# Patient Record
Sex: Male | Born: 1960 | Race: White | Hispanic: No | Marital: Married | Smoking: Former smoker
Health system: Southern US, Community
[De-identification: ages and names within clinical notes are randomized; demographics above are authoritative.]

## PROBLEM LIST (undated history)

## (undated) DIAGNOSIS — I82409 Acute embolism and thrombosis of unspecified deep veins of unspecified lower extremity: Secondary | ICD-10-CM

## (undated) DIAGNOSIS — E785 Hyperlipidemia, unspecified: Secondary | ICD-10-CM

## (undated) DIAGNOSIS — I10 Essential (primary) hypertension: Secondary | ICD-10-CM

## (undated) DIAGNOSIS — R570 Cardiogenic shock: Secondary | ICD-10-CM

## (undated) DIAGNOSIS — I509 Heart failure, unspecified: Secondary | ICD-10-CM

## (undated) DIAGNOSIS — G4733 Obstructive sleep apnea (adult) (pediatric): Secondary | ICD-10-CM

## (undated) DIAGNOSIS — I2510 Atherosclerotic heart disease of native coronary artery without angina pectoris: Secondary | ICD-10-CM

## (undated) DIAGNOSIS — E119 Type 2 diabetes mellitus without complications: Secondary | ICD-10-CM

## (undated) DIAGNOSIS — K859 Acute pancreatitis without necrosis or infection, unspecified: Secondary | ICD-10-CM

## (undated) HISTORY — PX: CORONARY STENT INTERVENTION: CATH118234

## (undated) HISTORY — PX: KNEE SURGERY: SHX244

## (undated) HISTORY — PX: ABDOMINAL SURGERY: SHX537

---

## 2008-10-24 ENCOUNTER — Ambulatory Visit: Payer: Self-pay | Admitting: Cardiology

## 2008-11-02 ENCOUNTER — Ambulatory Visit: Payer: Self-pay | Admitting: Cardiology

## 2008-11-19 ENCOUNTER — Ambulatory Visit: Payer: Self-pay | Admitting: Cardiology

## 2008-11-21 ENCOUNTER — Ambulatory Visit: Payer: Self-pay | Admitting: Cardiology

## 2009-04-28 DIAGNOSIS — I4949 Other premature depolarization: Secondary | ICD-10-CM | POA: Insufficient documentation

## 2010-12-03 NOTE — Assessment & Plan Note (Signed)
[facility]                          [facility] OFF[facility] NOTE   NAME:Steven Burnett, Steven Burnett                        MRN:          [id number]  DATE:11/21/2008                            DOB:          07/04/1961    PRIMARY CARE PHYSICIAN:  Dr. Lia Hopping.   REASON FOR VISIT:  Followup CardioNet monitor.   HISTORY OF PRESENT ILLNESS:  Steven Burnett was seen in the hospital in  consultation back in April.  He was referred at that time, given a  question of bradycardia and documentation of premature ventricular  complexes.  He had some mild sense of palpitations at times, but no  chest pain, dizziness breathlessness, or syncope.  He ruled out for  myocardial infarction and was referred for a resting 2-D echocardiogram,  which revealed a left ventricular ejection fraction of 50-55% with mild  left ventricular hypertrophy, mildly dilated right ventricle with normal  left ventricular systolic function, trace mitral regurgitation, and mild  tricuspid regurgitation with a right ventricular systolic pressure of 30  mmHg.  He does have a history of obstructive spleen apnea as well as  hypertension.  He underwent further ischemic evaluation via exercise  Cardiolite.  This study revealed frequent premature ventricular  complexes with no sustained arrhythmias and a limited rate related left  bundle-branch block pattern.  Hypertensive response was noted without  chest pain.  There was no obvious large perfusion defects to indicate  ischemia and a normal TID ratio at 0.9.  Ejection fraction was 51% by  that particular study.   Subsequently a CardioNet monitor was arranged as an outpatient.  This  study does reveal episodes of ventricular ectopy, typically isolated,  but with some bigeminy.  Rare couplets were seen with no non-sustained  ventricular tachycardia.  He had no clear evidence of actual bradycardia  on these tracings.  I suspect that the previous bradycardia was  more  related to automatic blood pressure cuff assessments while he was having  ventricular ectopy with an inaccurately low heart rate reading.  He  continues to report doing reasonably well without any major  symptomatology at this time.  He does state that he drinks lot of  caffeine.  We talked about cutting this back significantly.  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  I  talked about working on stress reduction, limiting caffeine, and  adopting a regular exercise regimen, specifically beginning with a  walking regimen.  Otherwise observation made the most sense at this  point.   ALLERGIES:  No known drug allergies.   MEDICATIONS:  1. Zoloft 100 mg p.o. daily.  2. Lotrel 10/20 mg p.o. daily.  3. Lopid 600 mg p.o. b.i.d.  4. Aspirin 81 mg p.o. daily.  5. Omega-3 supplements 1000 mg p.o. t.i.d.  6. Pancrelipase 4 tablets prior to each meal.  7. Prilosec 20 mg OTC p.r.n.  8. Colchicine 0.6 mg p.r.n.   REVIEW OF SYSTEMS:  As outlined above.  Otherwise negative.   PHYSICAL EXAMINATION:  VITAL SIGNS:  Blood pressure 127/77, heart  rate  62, and weight is to 265 pounds.  GENERAL:  Morbidly obese male, in no acute distress.  NECK:  Reveals no elevated jugular venous pressure.  No loud bruits or  thyromegaly is noted.  LUNGS:  Clear without labored breathing.  CARDIAC:  Regular rate and rhythm without S3 gallop.  EXTREMITIES:  Exhibit no significant pitting edema.   IMPRESSION AND RECOMMENDATIONS:  1. Ventricular ectopy without any sustained dysrhythmias or      nonsustained ventricular tachycardia.  No true bradycardia was      noted on CardioNet monitoring.  This seems to be largely      asymptomatic and noted in the setting of obstructive sleep apnea, a      high stress job, significant caffeine intake, and no regular      exercise activity at this time.  We talked about lifestyle      modification and observation at this point.  He  will continue to      see Dr. Olena Burnett and we will follow up on his progress over the next      6 months.  2. Recent ischemic evaluation, overall low-risk, with left ventricular      ejection fraction in the 50-55% range.     Jonelle Sidle, MD  Electronically Signed    SGM/MedQ  DD: 11/21/2008  DT: 11/22/2008  Job #: [id number]   cc:   Lia Hopping

## 2012-01-19 ENCOUNTER — Other Ambulatory Visit: Payer: Self-pay | Admitting: *Deleted

## 2012-07-21 DIAGNOSIS — I219 Acute myocardial infarction, unspecified: Secondary | ICD-10-CM

## 2012-07-21 HISTORY — DX: Acute myocardial infarction, unspecified: I21.9

## 2020-05-04 ENCOUNTER — Emergency Department (HOSPITAL_COMMUNITY): Payer: BC Managed Care – PPO

## 2020-05-04 ENCOUNTER — Other Ambulatory Visit: Payer: Self-pay

## 2020-05-04 ENCOUNTER — Encounter (HOSPITAL_COMMUNITY): Payer: Self-pay

## 2020-05-04 ENCOUNTER — Inpatient Hospital Stay (HOSPITAL_COMMUNITY)
Admission: EM | Admit: 2020-05-04 | Discharge: 2020-05-13 | DRG: 286 | Disposition: A | Discharge status: Home / Self Care | Payer: BC Managed Care – PPO | Attending: Cardiology | Admitting: Cardiology

## 2020-05-04 DIAGNOSIS — I483 Typical atrial flutter: Secondary | ICD-10-CM | POA: Diagnosis present

## 2020-05-04 DIAGNOSIS — Z6841 Body Mass Index (BMI) 40.0 and over, adult: Secondary | ICD-10-CM

## 2020-05-04 DIAGNOSIS — I4891 Unspecified atrial fibrillation: Principal | ICD-10-CM | POA: Diagnosis present

## 2020-05-04 DIAGNOSIS — Z9119 Patient's noncompliance with other medical treatment and regimen: Secondary | ICD-10-CM

## 2020-05-04 DIAGNOSIS — E785 Hyperlipidemia, unspecified: Secondary | ICD-10-CM | POA: Diagnosis present

## 2020-05-04 DIAGNOSIS — I429 Cardiomyopathy, unspecified: Secondary | ICD-10-CM | POA: Diagnosis present

## 2020-05-04 DIAGNOSIS — I110 Hypertensive heart disease with heart failure: Principal | ICD-10-CM | POA: Diagnosis present

## 2020-05-04 DIAGNOSIS — Z20822 Contact with and (suspected) exposure to covid-19: Secondary | ICD-10-CM | POA: Diagnosis present

## 2020-05-04 DIAGNOSIS — R0600 Dyspnea, unspecified: Secondary | ICD-10-CM

## 2020-05-04 DIAGNOSIS — I25119 Atherosclerotic heart disease of native coronary artery with unspecified angina pectoris: Secondary | ICD-10-CM | POA: Diagnosis not present

## 2020-05-04 DIAGNOSIS — K760 Fatty (change of) liver, not elsewhere classified: Secondary | ICD-10-CM | POA: Diagnosis present

## 2020-05-04 DIAGNOSIS — N179 Acute kidney failure, unspecified: Secondary | ICD-10-CM | POA: Diagnosis present

## 2020-05-04 DIAGNOSIS — J690 Pneumonitis due to inhalation of food and vomit: Secondary | ICD-10-CM | POA: Diagnosis present

## 2020-05-04 DIAGNOSIS — K8689 Other specified diseases of pancreas: Secondary | ICD-10-CM | POA: Diagnosis present

## 2020-05-04 DIAGNOSIS — E119 Type 2 diabetes mellitus without complications: Secondary | ICD-10-CM | POA: Diagnosis not present

## 2020-05-04 DIAGNOSIS — E876 Hypokalemia: Secondary | ICD-10-CM | POA: Diagnosis present

## 2020-05-04 DIAGNOSIS — R570 Cardiogenic shock: Secondary | ICD-10-CM | POA: Diagnosis present

## 2020-05-04 DIAGNOSIS — I4892 Unspecified atrial flutter: Secondary | ICD-10-CM | POA: Diagnosis not present

## 2020-05-04 DIAGNOSIS — Z7984 Long term (current) use of oral hypoglycemic drugs: Secondary | ICD-10-CM

## 2020-05-04 DIAGNOSIS — R0609 Other forms of dyspnea: Secondary | ICD-10-CM

## 2020-05-04 DIAGNOSIS — I2510 Atherosclerotic heart disease of native coronary artery without angina pectoris: Secondary | ICD-10-CM | POA: Diagnosis present

## 2020-05-04 DIAGNOSIS — Z79899 Other long term (current) drug therapy: Secondary | ICD-10-CM

## 2020-05-04 DIAGNOSIS — R7989 Other specified abnormal findings of blood chemistry: Secondary | ICD-10-CM

## 2020-05-04 DIAGNOSIS — Z9049 Acquired absence of other specified parts of digestive tract: Secondary | ICD-10-CM

## 2020-05-04 DIAGNOSIS — Z452 Encounter for adjustment and management of vascular access device: Secondary | ICD-10-CM

## 2020-05-04 DIAGNOSIS — Z8616 Personal history of COVID-19: Secondary | ICD-10-CM

## 2020-05-04 DIAGNOSIS — J9602 Acute respiratory failure with hypercapnia: Secondary | ICD-10-CM

## 2020-05-04 DIAGNOSIS — Z86718 Personal history of other venous thrombosis and embolism: Secondary | ICD-10-CM

## 2020-05-04 DIAGNOSIS — Z7982 Long term (current) use of aspirin: Secondary | ICD-10-CM

## 2020-05-04 DIAGNOSIS — I5021 Acute systolic (congestive) heart failure: Secondary | ICD-10-CM

## 2020-05-04 DIAGNOSIS — Z7902 Long term (current) use of antithrombotics/antiplatelets: Secondary | ICD-10-CM

## 2020-05-04 DIAGNOSIS — I10 Essential (primary) hypertension: Secondary | ICD-10-CM | POA: Diagnosis present

## 2020-05-04 DIAGNOSIS — E662 Morbid (severe) obesity with alveolar hypoventilation: Secondary | ICD-10-CM | POA: Diagnosis present

## 2020-05-04 DIAGNOSIS — J189 Pneumonia, unspecified organism: Secondary | ICD-10-CM

## 2020-05-04 DIAGNOSIS — I252 Old myocardial infarction: Secondary | ICD-10-CM

## 2020-05-04 DIAGNOSIS — Z955 Presence of coronary angioplasty implant and graft: Secondary | ICD-10-CM

## 2020-05-04 DIAGNOSIS — I5082 Biventricular heart failure: Secondary | ICD-10-CM | POA: Diagnosis present

## 2020-05-04 DIAGNOSIS — K761 Chronic passive congestion of liver: Secondary | ICD-10-CM | POA: Diagnosis present

## 2020-05-04 DIAGNOSIS — J9601 Acute respiratory failure with hypoxia: Secondary | ICD-10-CM | POA: Diagnosis present

## 2020-05-04 DIAGNOSIS — K7200 Acute and subacute hepatic failure without coma: Secondary | ICD-10-CM | POA: Diagnosis present

## 2020-05-04 DIAGNOSIS — Z87891 Personal history of nicotine dependence: Secondary | ICD-10-CM

## 2020-05-04 HISTORY — DX: Cardiogenic shock: R57.0

## 2020-05-04 HISTORY — DX: Acute embolism and thrombosis of unspecified deep veins of unspecified lower extremity: I82.409

## 2020-05-04 HISTORY — DX: Acute pancreatitis without necrosis or infection, unspecified: K85.90

## 2020-05-04 HISTORY — DX: Type 2 diabetes mellitus without complications: E11.9

## 2020-05-04 HISTORY — DX: Essential (primary) hypertension: I10

## 2020-05-04 HISTORY — DX: Obstructive sleep apnea (adult) (pediatric): G47.33

## 2020-05-04 HISTORY — DX: Atherosclerotic heart disease of native coronary artery without angina pectoris: I25.10

## 2020-05-04 HISTORY — DX: Hyperlipidemia, unspecified: E78.5

## 2020-05-04 LAB — TRIGLYCERIDES: Triglycerides: 354 mg/dL — ABNORMAL HIGH (ref <150)

## 2020-05-04 LAB — FIBRINOGEN: Fibrinogen: 366 mg/dL (ref 210–475)

## 2020-05-04 LAB — COMPREHENSIVE METABOLIC PANEL
ALT: 42 U/L (ref 0–44)
AST: 37 U/L (ref 15–41)
Albumin: 3.8 g/dL (ref 3.5–5.0)
Alkaline Phosphatase: 30 U/L — ABNORMAL LOW (ref 38–126)
Anion gap: 12 (ref 5–15)
BUN: 8 mg/dL (ref 6–20)
CO2: 22 mmol/L (ref 22–32)
Calcium: 9 mg/dL (ref 8.9–10.3)
Chloride: 104 mmol/L (ref 98–111)
Creatinine, Ser: 1.33 mg/dL — ABNORMAL HIGH (ref 0.61–1.24)
GFR, Estimated: 59 mL/min — ABNORMAL LOW (ref >60)
Glucose, Bld: 127 mg/dL — ABNORMAL HIGH (ref 70–99)
Potassium: 3.5 mmol/L (ref 3.5–5.1)
Sodium: 138 mmol/L (ref 135–145)
Total Bilirubin: 1.3 mg/dL — ABNORMAL HIGH (ref 0.3–1.2)
Total Protein: 6.7 g/dL (ref 6.5–8.1)

## 2020-05-04 LAB — CBC WITH DIFFERENTIAL/PLATELET
Abs Immature Granulocytes: 0.05 10*3/uL (ref 0.00–0.07)
Basophils Absolute: 0 10*3/uL (ref 0.0–0.1)
Basophils Relative: 0 %
Eosinophils Absolute: 0.1 10*3/uL (ref 0.0–0.5)
Eosinophils Relative: 1 %
HCT: 43.7 % (ref 39.0–52.0)
Hemoglobin: 14.2 g/dL (ref 13.0–17.0)
Immature Granulocytes: 1 %
Lymphocytes Relative: 9 %
Lymphs Abs: 0.9 10*3/uL (ref 0.7–4.0)
MCH: 29.6 pg (ref 26.0–34.0)
MCHC: 32.5 g/dL (ref 30.0–36.0)
MCV: 91 fL (ref 80.0–100.0)
Monocytes Absolute: 0.5 10*3/uL (ref 0.1–1.0)
Monocytes Relative: 6 %
Neutro Abs: 7.9 10*3/uL — ABNORMAL HIGH (ref 1.7–7.7)
Neutrophils Relative %: 83 %
Platelets: 224 10*3/uL (ref 150–400)
RBC: 4.8 MIL/uL (ref 4.22–5.81)
RDW: 12.8 % (ref 11.5–15.5)
WBC: 9.5 10*3/uL (ref 4.0–10.5)
nRBC: 0 % (ref 0.0–0.2)

## 2020-05-04 LAB — RESPIRATORY PANEL BY RT PCR (FLU A&B, COVID)
Influenza A by PCR: NEGATIVE
Influenza B by PCR: NEGATIVE
SARS Coronavirus 2 by RT PCR: NEGATIVE

## 2020-05-04 LAB — I-STAT CHEM 8, ED
BUN: 14 mg/dL (ref 6–20)
Calcium, Ion: 1.08 mmol/L — ABNORMAL LOW (ref 1.15–1.40)
Chloride: 105 mmol/L (ref 98–111)
Creatinine, Ser: 1.1 mg/dL (ref 0.61–1.24)
Glucose, Bld: 112 mg/dL — ABNORMAL HIGH (ref 70–99)
HCT: 43 % (ref 39.0–52.0)
Hemoglobin: 14.6 g/dL (ref 13.0–17.0)
Potassium: 4.2 mmol/L (ref 3.5–5.1)
Sodium: 142 mmol/L (ref 135–145)
TCO2: 26 mmol/L (ref 22–32)

## 2020-05-04 LAB — D-DIMER, QUANTITATIVE: D-Dimer, Quant: 0.4 ug/mL-FEU (ref 0.00–0.50)

## 2020-05-04 LAB — LACTIC ACID, PLASMA
Lactic Acid, Venous: 2.2 mmol/L (ref 0.5–1.9)
Lactic Acid, Venous: 1.7 mmol/L (ref 0.5–1.9)

## 2020-05-04 LAB — TROPONIN I (HIGH SENSITIVITY)
Troponin I (High Sensitivity): 117 ng/L (ref <18)
Troponin I (High Sensitivity): 145 ng/L (ref <18)

## 2020-05-04 LAB — LACTATE DEHYDROGENASE: LDH: 209 U/L — ABNORMAL HIGH (ref 98–192)

## 2020-05-04 LAB — PROCALCITONIN: Procalcitonin: <0.1 ng/mL

## 2020-05-04 MED ORDER — ACETAMINOPHEN 325 MG PO TABS
650.0000 mg | ORAL_TABLET | Freq: Once | ORAL | Status: AC
  Administered 2020-05-04: 650 mg via ORAL
  Filled 2020-05-04: qty 2

## 2020-05-04 MED ORDER — DILTIAZEM HCL-DEXTROSE 125-5 MG/125ML-% IV SOLN (PREMIX)
5.0000 mg/h | INTRAVENOUS | Status: DC
  Administered 2020-05-04: 10 mg/h via INTRAVENOUS
  Filled 2020-05-04 (×3): qty 125

## 2020-05-04 MED ORDER — HEPARIN (PORCINE) 25000 UT/250ML-% IV SOLN
2150.0000 [IU]/h | INTRAVENOUS | Status: DC
  Administered 2020-05-04: 1400 [IU]/h via INTRAVENOUS
  Administered 2020-05-06 – 2020-05-08 (×5): 2150 [IU]/h via INTRAVENOUS
  Filled 2020-05-04 (×8): qty 250

## 2020-05-04 MED ORDER — SODIUM CHLORIDE 0.9 % IV BOLUS
500.0000 mL | Freq: Once | INTRAVENOUS | Status: AC
  Administered 2020-05-04: 500 mL via INTRAVENOUS

## 2020-05-04 MED ORDER — DILTIAZEM HCL 25 MG/5ML IV SOLN
10.0000 mg | Freq: Once | INTRAVENOUS | Status: AC
  Administered 2020-05-04: 10 mg via INTRAVENOUS
  Filled 2020-05-04: qty 5

## 2020-05-04 MED ORDER — ALPRAZOLAM 0.5 MG PO TABS
1.0000 mg | ORAL_TABLET | Freq: Every evening | ORAL | Status: DC | PRN
  Administered 2020-05-05 – 2020-05-10 (×6): 1 mg via ORAL
  Filled 2020-05-04: qty 2
  Filled 2020-05-04: qty 4
  Filled 2020-05-04: qty 2
  Filled 2020-05-04 (×2): qty 4
  Filled 2020-05-04: qty 2
  Filled 2020-05-04: qty 4

## 2020-05-04 MED ORDER — SERTRALINE HCL 100 MG PO TABS
100.0000 mg | ORAL_TABLET | Freq: Every day | ORAL | Status: DC
  Administered 2020-05-05 – 2020-05-08 (×5): 100 mg via ORAL
  Filled 2020-05-04 (×7): qty 1

## 2020-05-04 MED ORDER — ACETAMINOPHEN 325 MG PO TABS
650.0000 mg | ORAL_TABLET | ORAL | Status: DC | PRN
  Administered 2020-05-05 – 2020-05-07 (×4): 650 mg via ORAL
  Filled 2020-05-04 (×4): qty 2

## 2020-05-04 MED ORDER — HEPARIN BOLUS VIA INFUSION
5000.0000 [IU] | Freq: Once | INTRAVENOUS | Status: AC
  Administered 2020-05-04: 5000 [IU] via INTRAVENOUS
  Filled 2020-05-04: qty 5000

## 2020-05-04 MED ORDER — INSULIN ASPART 100 UNIT/ML ~~LOC~~ SOLN
0.0000 [IU] | Freq: Three times a day (TID) | SUBCUTANEOUS | Status: DC
  Administered 2020-05-05 – 2020-05-08 (×5): 2 [IU] via SUBCUTANEOUS
  Administered 2020-05-08: 3 [IU] via SUBCUTANEOUS
  Administered 2020-05-09 – 2020-05-11 (×2): 2 [IU] via SUBCUTANEOUS
  Administered 2020-05-12: 3 [IU] via SUBCUTANEOUS
  Administered 2020-05-12: 5 [IU] via SUBCUTANEOUS
  Administered 2020-05-13: 3 [IU] via SUBCUTANEOUS

## 2020-05-04 MED ORDER — DILTIAZEM HCL 25 MG/5ML IV SOLN
10.0000 mg | Freq: Once | INTRAVENOUS | Status: AC
  Administered 2020-05-04: 10 mg via INTRAVENOUS

## 2020-05-04 MED ORDER — INSULIN ASPART 100 UNIT/ML ~~LOC~~ SOLN: 0.0000 [IU] | Freq: Every day | SUBCUTANEOUS | Status: DC

## 2020-05-04 MED ORDER — ONDANSETRON HCL 4 MG/2ML IJ SOLN
4.0000 mg | Freq: Four times a day (QID) | INTRAMUSCULAR | Status: DC | PRN
  Administered 2020-05-06 – 2020-05-07 (×4): 4 mg via INTRAVENOUS
  Filled 2020-05-04 (×4): qty 2

## 2020-05-04 NOTE — Progress Notes (Signed)
ANTICOAGULATION CONSULT NOTE - Initial Consult  Pharmacy Consult for heparin Indication: atrial fibrillation  No Known Allergies  Patient Measurements: Height: 5\' 8"  (172.7 cm) Weight: 134.3 kg (296 lb) IBW/kg (Calculated) : 68.4 Heparin Dosing Weight: 100kg  Vital Signs: Temp: 99.1 F (37.3 C) (10/15 2157) Temp Source: Oral (10/15 2157) BP: 151/107 (10/15 2157) Pulse Rate: 117 (10/15 2157)  Labs: Recent Labs    05/04/20 1923 05/04/20 2008  HGB 14.2 14.6  HCT 43.7 43.0  PLT 224  --   CREATININE 1.33* 1.10  TROPONINIHS 117*  --     Estimated Creatinine Clearance: 98.2 mL/min (by C-G formula based on SCr of 1.1 mg/dL).   Medical History: Past Medical History:  Diagnosis Date  . HTN (hypertension)   . Hx of heart artery stent   . MI (myocardial infarction) (HCC)    Assessment: 36 YOM presenting with CP/SOB and palpitations, found with new onset aflutter with RVR.  Not on anticoagulation PTA, CBC wnl.    Goal of Therapy:  Heparin level 0.3-0.7 units/ml Monitor platelets by anticoagulation protocol: Yes   Plan:  Heparin 5000 units IV x 1, and gtt at 1400 units/hr F/u 6 hour heparin level with AM labs Daily heparin level, CBC, s/s bleeding F/u long term AC plan and transition to PO  41, PharmD Clinical Pharmacist ED Pharmacist Phone # [phone] 05/04/2020 10:44 PM

## 2020-05-04 NOTE — H&P (Addendum)
History and Physical    Steven Burnett PZW:[id number] DOB: 1961/02/22 DOA: 05/04/2020  PCP: Toma Deiters, MD  Patient coming from: [place]  I have personally briefly reviewed patient's old medical records in [facility]  Chief Complaint: CP, SOB  HPI: Steven Burnett is a 59 y.o. male with medical history significant of MI s/p 3 stents in 2014, HTN, HLD, DM2.  Pt had COVID late last year.  Pt had Pfizer vaccine (2 doses) earlier this year.  Pt's son is s/p renal transplant, son has COVID-19 and is quarantining at home currently in the patients basement.  Son diagnosed with COVID ~10 days ago.  Pt began feeling ill x3 days ago with CP, SOB, palpitations.  Subjective fever.  Symptoms are constant, worsening, nothing makes better or worse, moderate severity.  No cough.  No abd pain.   ED Course: Pt has A.Flutter with RVR, no STEMI, does have trops of 117 and 145.  No O2 requirement, does have Tm 100.5.  CXR shows atypical / viral PNA c/w COVID.  Procalcitonin neg.  D.dimer 0.4.  WBC nl  Covid and flu are surprisingly negative on first testing.  A.Flutter is rate controlled with Cardizem gtt, CP resolved at this time.   Review of Systems: As per HPI, otherwise all review of systems negative.  Past Medical History:  Diagnosis Date  . DVT (deep venous thrombosis) (HCC)    Provoked by knee surgery  . HLD (hyperlipidemia)   . HTN (hypertension)   . MI (myocardial infarction) (HCC) 2014   3 stents  . Pancreatitis     Past Surgical History:  Procedure Laterality Date  . ABDOMINAL SURGERY    . CORONARY STENT INTERVENTION    . KNEE SURGERY       reports that he has never smoked. He has never used smokeless tobacco. He reports that he does not drink alcohol and does not use drugs.  No Known Allergies  Family History  Problem Relation Age of Onset  . Kidney failure Son        s/p transplant     Prior to Admission medications   Not on File    Physical  Exam: Vitals:   05/04/20 2145 05/04/20 2157 05/04/20 2230 05/04/20 2239  BP: (!) 151/107 (!) 151/107 (!) 138/101 (!) 144/88  Pulse: (!) 123 (!) 117 (!) 47 96  Resp: (!) 32 19 (!) 24 18  Temp:  99.1 F (37.3 C)  99.1 F (37.3 C)  TempSrc:  Oral  Oral  SpO2:  99%  99%  Weight:      Height:        Constitutional: NAD, calm, comfortable Eyes: PERRL, lids and conjunctivae normal ENMT: Mucous membranes are moist. Posterior pharynx clear of any exudate or lesions.Normal dentition.  Neck: normal, supple, no masses, no thyromegaly Respiratory: clear to auscultation bilaterally, no wheezing, no crackles. Normal respiratory effort. No accessory muscle use.  Cardiovascular: IRR, IRR no murmurs / rubs / gallops. No extremity edema. 2+ pedal pulses. No carotid bruits.  Abdomen: no tenderness, no masses palpated. No hepatosplenomegaly. Bowel sounds positive.  Musculoskeletal: no clubbing / cyanosis. No joint deformity upper and lower extremities. Good ROM, no contractures. Normal muscle tone.  Skin: no rashes, lesions, ulcers. No induration Neurologic: CN 2-12 grossly intact. Sensation intact, DTR normal. Strength 5/5 in all 4.  Psychiatric: Normal judgment and insight. Alert and oriented x 3. Normal mood.    Labs on Admission: I have personally reviewed following  labs and imaging studies  CBC: Recent Labs  Lab 05/04/20 1923 05/04/20 2008  WBC 9.5  --   NEUTROABS 7.9*  --   HGB 14.2 14.6  HCT 43.7 43.0  MCV 91.0  --   PLT 224  --    Basic Metabolic Panel: Recent Labs  Lab 05/04/20 1923 05/04/20 2008  NA 138 142  K 3.5 4.2  CL 104 105  CO2 22  --   GLUCOSE 127* 112*  BUN 8 14  CREATININE 1.33* 1.10  CALCIUM 9.0  --    GFR: Estimated Creatinine Clearance: 98.2 mL/min (by C-G formula based on SCr of 1.1 mg/dL). Liver Function Tests: Recent Labs  Lab 05/04/20 1923  AST 37  ALT 42  ALKPHOS 30*  BILITOT 1.3*  PROT 6.7  ALBUMIN 3.8   No results for input(s): LIPASE,  AMYLASE in the last 168 hours. No results for input(s): AMMONIA in the last 168 hours. Coagulation Profile: No results for input(s): INR, PROTIME in the last 168 hours. Cardiac Enzymes: No results for input(s): CKTOTAL, CKMB, CKMBINDEX, TROPONINI in the last 168 hours. BNP (last 3 results) No results for input(s): PROBNP in the last 8760 hours. HbA1C: No results for input(s): HGBA1C in the last 72 hours. CBG: No results for input(s): GLUCAP in the last 168 hours. Lipid Profile: Recent Labs    05/04/20 1923  TRIG 354*   Thyroid Function Tests: No results for input(s): TSH, T4TOTAL, FREET4, T3FREE, THYROIDAB in the last 72 hours. Anemia Panel: No results for input(s): VITAMINB12, FOLATE, FERRITIN, TIBC, IRON, RETICCTPCT in the last 72 hours. Urine analysis: No results found for: COLORURINE, APPEARANCEUR, LABSPEC, PHURINE, GLUCOSEU, HGBUR, BILIRUBINUR, KETONESUR, PROTEINUR, UROBILINOGEN, NITRITE, LEUKOCYTESUR  Radiological Exams on Admission: DG Chest Port 1 View  Result Date: 05/04/2020 CLINICAL DATA:  Chest pain, shortness of breath. Patient max needed but living with sign waist COVID positive. EXAM: PORTABLE CHEST 1 VIEW COMPARISON:  Chest x-ray 10/24/2008 report without images FINDINGS: The heart size and mediastinal contours are within normal limits with mildly prominent cardiac silhouette likely due to AP portable technique. Increased interstitial markings and airspace opacities of the mid to lower lung zones. No pleural effusion. No pneumothorax. No acute osseous abnormality. IMPRESSION: Findings suggestive of atypical/viral pneumonia. COVID-19 infection not excluded. Electronically Signed   By: Tish Frederickson M.D.   On: 05/04/2020 20:47    EKG: Independently reviewed. Shows A.Flutter 2:1 with rate of 140  Assessment/Plan Principal Problem:   Atrial flutter with rapid ventricular response (HCC) Active Problems:   Suspected COVID-19 virus infection   HLD (hyperlipidemia)    CAD (coronary artery disease)   HTN (hypertension)   Pancreatic insufficiency    1. A.Flutter with RVR - 1. A.Flutter pathway 2. Cardizem gtt for rate control 3. Chads vasc is either 3 or 5 depending on wether you consider a provoked DVT to get him the 2 points for stroke/tia/thromboembolism or not (the patient meets indications for anticoagulation either way). 4. Empiric heparin gtt 5. Tele monitor 6. Serial trops 2. Suspected COVID-19 - 1. Suspect mild COVID as pt is both vaccinated and had COVID in past.  HPI, CXR, and other lab findings highly suspect for COVID. 2. Repeating COVID testing 3. If positive consider MAB therapy 4. No O2 requirement currently 3. HTN - 1. Cardizem gtt 2. Med rec pending 4. CAD - 1. Cont plavix for now 2. Serial trops 3. Suspect demand ischemia due to HR with A.Flutter 2:1 4. Tele monitor 5. CP  resolved after HR controlled currently 5. HLD - 1. Cont statin 6. Pancreatic insufficiency - 1. Cont creon when med rec completed 7. DM2 - 1. Hold metformin 2. Mod scale SSI AC/HS  DVT prophylaxis: Heparin gtt Code Status: Full Family Communication: No family in room Disposition Plan: Home after A.Fib rate controlled and troponins stabilized Consults called: None Admission status: Place in obs    Khristen Cheyney M. DO [facility]  How to contact the [facility] Attending or Consulting provider 7A - 7P or covering provider during after hours 7P -7A, for this patient?  1. Check the care team in [facility] and look for a) attending/consulting [facility] provider listed and b) the [facility] team listed 2. Log into [email or web address]  Amion Physician Scheduling and messaging for groups and whole hospitals  On call and physician scheduling software for group practices, residents, hospitalists and other medical providers for call, clinic, rotation and shift schedules. On[employer] is a hospital-wide system for scheduling doctors and paging doctors on call. EasyPlot is for  scientific plotting and data analysis.  [email or web address]  and use [facility]'s universal password to access. If you do not have the password, please contact the hospital operator.  3. Locate the [facility] provider you are looking for under [facility] and page to a number that you can be directly reached. 4. If you still have difficulty reaching the provider, please page the [facility] (Director on Call) for the Hospitalists listed on amion for assistance.  05/04/2020, 11:13 PM

## 2020-05-04 NOTE — ED Triage Notes (Signed)
Per [facility], pt coming from home reporting chest pain, code stemi called at this time. Hx of stemi in 2014, pt placed on liters 2 [place]. 20g in left ac, vss in route, 91% spo on room air. Hx of covid. pts son has covi and is living same home. Pt took 81mg  of aspirin this morning.

## 2020-05-04 NOTE — ED Provider Notes (Signed)
[facility] EMERGENCY DEPARTMENT Provider Note   CSN: [id number] Arrival date & time: 05/04/20  1921     History Chief Complaint  Patient presents with  . Code STEMI  . covid  . Chest Pain  . Shortness of Breath    ULYS FAVIA is a 59 y.o. male hx of MI status post stents, here presenting with chest pain or shortness of breath and palpitations.  Patient states that his son was diagnosed with Covid about 10 days ago.  He has been quarantining at the basement.  Patient states that he has been developing shortness of breath and palpitations for the last 3 to 4 days.  He has some chest pressure as well.  Also has some subjective fever as well.  Patient did receive his Pfizer vaccine back in March.  Patient called EMS and EMS noted possible STEMI.  Cardiology (Dr. Eldridge Dace) reviewed the tracing and states that patient is in a flutter and this is not a STEMI so code STEMI was canceled.  The history is provided by the patient.       Past Medical History:  Diagnosis Date  . Hx of heart artery stent   . MI (myocardial infarction) [facility])     Patient Active Problem List   Diagnosis Date Noted  . VENTRICULAR ECTOPY 04/28/2009      No family history on file.  Social History   Tobacco Use  . Smoking status: Never Smoker  . Smokeless tobacco: Never Used  Substance Use Topics  . Alcohol use: Never  . Drug use: Never    Home Medications Prior to Admission medications   Not on File    Allergies    Patient has no known allergies.  Review of Systems   Review of Systems  Respiratory: Positive for shortness of breath.   Cardiovascular: Positive for chest pain.  All other systems reviewed and are negative.   Physical Exam Updated Vital Signs BP (!) 151/107   Pulse (!) 117   Temp 99.1 F (37.3 C) (Oral)   Resp 19   Ht 5\' 8"  (1.727 m)   Wt 134.3 kg   SpO2 99%   BMI 45.01 kg/m   Physical Exam Vitals and nursing note reviewed.  Constitutional:        Comments: Uncomfortable   HENT:     Head: Normocephalic.  Eyes:     Extraocular Movements: Extraocular movements intact.     Pupils: Pupils are equal, round, and reactive to light.  Cardiovascular:     Rate and Rhythm: Tachycardia present. Rhythm irregular.     Heart sounds: Normal heart sounds.  Pulmonary:     Comments: tachypneic  Abdominal:     General: Bowel sounds are normal.     Palpations: Abdomen is soft.  Musculoskeletal:        General: Normal range of motion.     Cervical back: Normal range of motion and neck supple.  Skin:    General: Skin is warm.     Capillary Refill: Capillary refill takes less than 2 seconds.  Neurological:     General: No focal deficit present.     Mental Status: He is oriented to person, place, and time.  Psychiatric:        Mood and Affect: Mood normal.        Behavior: Behavior normal.     ED Results / Procedures / Treatments   Labs (all labs ordered are listed, but only abnormal results are  displayed) Labs Reviewed  CBC WITH DIFFERENTIAL/PLATELET - Abnormal; Notable for the following components:      Result Value   Neutro Abs 7.9 (*)    All other components within normal limits  COMPREHENSIVE METABOLIC PANEL - Abnormal; Notable for the following components:   Glucose, Bld 127 (*)    Creatinine, Ser 1.33 (*)    Alkaline Phosphatase 30 (*)    Total Bilirubin 1.3 (*)    GFR, Estimated 59 (*)    All other components within normal limits  LACTIC ACID, PLASMA - Abnormal; Notable for the following components:   Lactic Acid, Venous 2.2 (*)    All other components within normal limits  LACTATE DEHYDROGENASE - Abnormal; Notable for the following components:   LDH 209 (*)    All other components within normal limits  TRIGLYCERIDES - Abnormal; Notable for the following components:   Triglycerides 354 (*)    All other components within normal limits  I-STAT CHEM 8, ED - Abnormal; Notable for the following components:   Glucose, Bld  112 (*)    Calcium, Ion 1.08 (*)    All other components within normal limits  TROPONIN I (HIGH SENSITIVITY) - Abnormal; Notable for the following components:   Troponin I (High Sensitivity) 117 (*)    All other components within normal limits  RESPIRATORY PANEL BY RT PCR (FLU A&B, COVID)  CULTURE, BLOOD (ROUTINE X 2)  CULTURE, BLOOD (ROUTINE X 2)  D-DIMER, QUANTITATIVE (NOT AT [facility])  FIBRINOGEN  PROCALCITONIN  LACTIC ACID, PLASMA  C-REACTIVE PROTEIN  FERRITIN  TROPONIN I (HIGH SENSITIVITY)    EKG EKG Interpretation  Date/Time:  Friday May 04 2020 19:31:51 EDT Ventricular Rate:  139 PR Interval:    QRS Duration: 183 QT Interval:  394 QTC Calculation: 600 R Axis:   73 Text Interpretation: Sinus or ectopic atrial tachycardia Ventricular premature complex Right bundle branch block Probable left ventricular hypertrophy Prolonged QT interval No previous ECGs available Confirmed by Richardean Canal ([id number]) on 05/04/2020 7:57:04 PM   Radiology DG Chest Port 1 View  Result Date: 05/04/2020 CLINICAL DATA:  Chest pain, shortness of breath. Patient max needed but living with sign waist COVID positive. EXAM: PORTABLE CHEST 1 VIEW COMPARISON:  Chest x-ray 10/24/2008 report without images FINDINGS: The heart size and mediastinal contours are within normal limits with mildly prominent cardiac silhouette likely due to AP portable technique. Increased interstitial markings and airspace opacities of the mid to lower lung zones. No pleural effusion. No pneumothorax. No acute osseous abnormality. IMPRESSION: Findings suggestive of atypical/viral pneumonia. COVID-19 infection not excluded. Electronically Signed   By: Tish Frederickson M.D.   On: 05/04/2020 20:47    Procedures Procedures (including critical care time)  CRITICAL CARE Performed by: Richardean Canal   Total critical care time: 45 minutes  Critical care time was exclusive of separately billable procedures and treating other  patients.  Critical care was necessary to treat or prevent imminent or life-threatening deterioration.  Critical care was time spent personally by me on the following activities: development of treatment plan with patient and/or surrogate as well as nursing, discussions with consultants, evaluation of patient's response to treatment, examination of patient, obtaining history from patient or surrogate, ordering and performing treatments and interventions, ordering and review of laboratory studies, ordering and review of radiographic studies, pulse oximetry and re-evaluation of patient's condition.   Medications Ordered in ED Medications  diltiazem (CARDIZEM) 125 mg in dextrose 5% 125 mL (1 mg/mL) infusion (10  mg/hr Intravenous New Bag/Given 05/04/20 2155)  diltiazem (CARDIZEM) injection 10 mg (10 mg Intravenous Given 05/04/20 1943)  sodium chloride 0.9 % bolus 500 mL (0 mLs Intravenous Stopped 05/04/20 2156)  acetaminophen (TYLENOL) tablet 650 mg (650 mg Oral Given 05/04/20 2010)  diltiazem (CARDIZEM) injection 10 mg (10 mg Intravenous Given 05/04/20 2021)  diltiazem (CARDIZEM) injection 10 mg (10 mg Intravenous Given 05/04/20 2153)    ED Course  I have reviewed the triage vital signs and the nursing notes.  Pertinent labs & imaging results that were available during my care of the patient were reviewed by me and considered in my medical decision making (see chart for details).    MDM Rules/Calculators/A&P                         ZYAIR RUSSI is a 59 y.o. male here with chest pain and shortness of breath. Patient is in new onset aflutter rate 140s. Aflutter likely brought on by COVID. Patient's son is positive for COVID. Febrile and tachycardic in the ED. Will get COVID preadmission labs, Trop. Will give tylenol, cardizem.  10:27 PM COVID negative. HR down to 110s on the cardizem drip. Trop 110 likely demand ischemia. Will admit for new onset afib with RVR.       Final Clinical  Impression(s) / ED Diagnoses Final diagnoses:  None    Rx / DC Orders ED Discharge Orders    None       Charlynne Pander, MD 05/04/20 2230

## 2020-05-04 NOTE — ED Notes (Signed)
Dr Silverio Lay at bedside reporting rapid aflutter

## 2020-05-05 ENCOUNTER — Observation Stay (HOSPITAL_COMMUNITY): Payer: BC Managed Care – PPO

## 2020-05-05 DIAGNOSIS — J9601 Acute respiratory failure with hypoxia: Secondary | ICD-10-CM | POA: Diagnosis present

## 2020-05-05 DIAGNOSIS — I4892 Unspecified atrial flutter: Secondary | ICD-10-CM | POA: Diagnosis present

## 2020-05-05 DIAGNOSIS — I5021 Acute systolic (congestive) heart failure: Secondary | ICD-10-CM | POA: Diagnosis present

## 2020-05-05 DIAGNOSIS — Z79899 Other long term (current) drug therapy: Secondary | ICD-10-CM | POA: Diagnosis not present

## 2020-05-05 DIAGNOSIS — Z7982 Long term (current) use of aspirin: Secondary | ICD-10-CM | POA: Diagnosis not present

## 2020-05-05 DIAGNOSIS — R570 Cardiogenic shock: Secondary | ICD-10-CM | POA: Diagnosis present

## 2020-05-05 DIAGNOSIS — Z8616 Personal history of COVID-19: Secondary | ICD-10-CM | POA: Diagnosis not present

## 2020-05-05 DIAGNOSIS — K8689 Other specified diseases of pancreas: Secondary | ICD-10-CM | POA: Diagnosis present

## 2020-05-05 DIAGNOSIS — Z7984 Long term (current) use of oral hypoglycemic drugs: Secondary | ICD-10-CM | POA: Diagnosis not present

## 2020-05-05 DIAGNOSIS — Z6841 Body Mass Index (BMI) 40.0 and over, adult: Secondary | ICD-10-CM | POA: Diagnosis not present

## 2020-05-05 DIAGNOSIS — N179 Acute kidney failure, unspecified: Secondary | ICD-10-CM | POA: Diagnosis present

## 2020-05-05 DIAGNOSIS — I429 Cardiomyopathy, unspecified: Secondary | ICD-10-CM | POA: Diagnosis present

## 2020-05-05 DIAGNOSIS — Z9049 Acquired absence of other specified parts of digestive tract: Secondary | ICD-10-CM | POA: Diagnosis not present

## 2020-05-05 DIAGNOSIS — I10 Essential (primary) hypertension: Secondary | ICD-10-CM | POA: Diagnosis not present

## 2020-05-05 DIAGNOSIS — J690 Pneumonitis due to inhalation of food and vomit: Secondary | ICD-10-CM | POA: Diagnosis present

## 2020-05-05 DIAGNOSIS — E785 Hyperlipidemia, unspecified: Secondary | ICD-10-CM | POA: Diagnosis present

## 2020-05-05 DIAGNOSIS — I2510 Atherosclerotic heart disease of native coronary artery without angina pectoris: Secondary | ICD-10-CM | POA: Diagnosis present

## 2020-05-05 DIAGNOSIS — I4891 Unspecified atrial fibrillation: Secondary | ICD-10-CM | POA: Diagnosis present

## 2020-05-05 DIAGNOSIS — Z7902 Long term (current) use of antithrombotics/antiplatelets: Secondary | ICD-10-CM | POA: Diagnosis not present

## 2020-05-05 DIAGNOSIS — I25119 Atherosclerotic heart disease of native coronary artery with unspecified angina pectoris: Secondary | ICD-10-CM | POA: Diagnosis not present

## 2020-05-05 DIAGNOSIS — I252 Old myocardial infarction: Secondary | ICD-10-CM | POA: Diagnosis not present

## 2020-05-05 DIAGNOSIS — J189 Pneumonia, unspecified organism: Secondary | ICD-10-CM

## 2020-05-05 DIAGNOSIS — K7200 Acute and subacute hepatic failure without coma: Secondary | ICD-10-CM | POA: Diagnosis present

## 2020-05-05 DIAGNOSIS — Z955 Presence of coronary angioplasty implant and graft: Secondary | ICD-10-CM | POA: Diagnosis not present

## 2020-05-05 DIAGNOSIS — E662 Morbid (severe) obesity with alveolar hypoventilation: Secondary | ICD-10-CM | POA: Diagnosis present

## 2020-05-05 DIAGNOSIS — I110 Hypertensive heart disease with heart failure: Secondary | ICD-10-CM | POA: Diagnosis present

## 2020-05-05 DIAGNOSIS — I483 Typical atrial flutter: Secondary | ICD-10-CM | POA: Diagnosis present

## 2020-05-05 DIAGNOSIS — E119 Type 2 diabetes mellitus without complications: Secondary | ICD-10-CM | POA: Diagnosis present

## 2020-05-05 LAB — HEPARIN LEVEL (UNFRACTIONATED)
Heparin Unfractionated: <0.1 IU/mL — ABNORMAL LOW (ref 0.30–0.70)
Heparin Unfractionated: 0.2 IU/mL — ABNORMAL LOW (ref 0.30–0.70)
Heparin Unfractionated: 0.21 IU/mL — ABNORMAL LOW (ref 0.30–0.70)

## 2020-05-05 LAB — CBG MONITORING, ED
Glucose-Capillary: 106 mg/dL — ABNORMAL HIGH (ref 70–99)
Glucose-Capillary: 125 mg/dL — ABNORMAL HIGH (ref 70–99)
Glucose-Capillary: 131 mg/dL — ABNORMAL HIGH (ref 70–99)
Glucose-Capillary: 141 mg/dL — ABNORMAL HIGH (ref 70–99)
Glucose-Capillary: 177 mg/dL — ABNORMAL HIGH (ref 70–99)

## 2020-05-05 LAB — BASIC METABOLIC PANEL
Anion gap: 16 — ABNORMAL HIGH (ref 5–15)
BUN: 12 mg/dL (ref 6–20)
CO2: 19 mmol/L — ABNORMAL LOW (ref 22–32)
Calcium: 9.1 mg/dL (ref 8.9–10.3)
Chloride: 104 mmol/L (ref 98–111)
Creatinine, Ser: 1.33 mg/dL — ABNORMAL HIGH (ref 0.61–1.24)
GFR, Estimated: 59 mL/min — ABNORMAL LOW (ref >60)
Glucose, Bld: 140 mg/dL — ABNORMAL HIGH (ref 70–99)
Potassium: 3.4 mmol/L — ABNORMAL LOW (ref 3.5–5.1)
Sodium: 139 mmol/L (ref 135–145)

## 2020-05-05 LAB — TROPONIN I (HIGH SENSITIVITY)
Troponin I (High Sensitivity): 149 ng/L (ref <18)
Troponin I (High Sensitivity): 121 ng/L (ref <18)
Troponin I (High Sensitivity): 133 ng/L (ref <18)
Troponin I (High Sensitivity): 115 ng/L (ref <18)

## 2020-05-05 LAB — CBC
HCT: 42.3 % (ref 39.0–52.0)
Hemoglobin: 13.9 g/dL (ref 13.0–17.0)
MCH: 30.2 pg (ref 26.0–34.0)
MCHC: 32.9 g/dL (ref 30.0–36.0)
MCV: 92 fL (ref 80.0–100.0)
Platelets: 242 10*3/uL (ref 150–400)
RBC: 4.6 MIL/uL (ref 4.22–5.81)
RDW: 13 % (ref 11.5–15.5)
WBC: 9.3 10*3/uL (ref 4.0–10.5)
nRBC: 0.2 % (ref 0.0–0.2)

## 2020-05-05 LAB — RESPIRATORY PANEL BY RT PCR (FLU A&B, COVID)
Influenza A by PCR: NEGATIVE
Influenza B by PCR: NEGATIVE
SARS Coronavirus 2 by RT PCR: NEGATIVE

## 2020-05-05 MED ORDER — GUAIFENESIN ER 600 MG PO TB12
1200.0000 mg | ORAL_TABLET | Freq: Two times a day (BID) | ORAL | Status: DC
  Administered 2020-05-05 – 2020-05-13 (×16): 1200 mg via ORAL
  Filled 2020-05-05 (×16): qty 2

## 2020-05-05 MED ORDER — SILVER SULFADIAZINE 1 % EX CREA
TOPICAL_CREAM | Freq: Every day | CUTANEOUS | Status: DC
  Administered 2020-05-06: 1 via TOPICAL
  Filled 2020-05-05: qty 85

## 2020-05-05 MED ORDER — FENOFIBRATE 160 MG PO TABS
160.0000 mg | ORAL_TABLET | Freq: Every day | ORAL | Status: DC
  Administered 2020-05-05 – 2020-05-13 (×9): 160 mg via ORAL
  Filled 2020-05-05 (×9): qty 1

## 2020-05-05 MED ORDER — PANTOPRAZOLE SODIUM 40 MG PO TBEC
40.0000 mg | DELAYED_RELEASE_TABLET | Freq: Every day | ORAL | Status: DC
  Administered 2020-05-05 – 2020-05-13 (×9): 40 mg via ORAL
  Filled 2020-05-05 (×9): qty 1

## 2020-05-05 MED ORDER — LORAZEPAM 0.5 MG PO TABS
0.5000 mg | ORAL_TABLET | Freq: Four times a day (QID) | ORAL | Status: DC | PRN
  Administered 2020-05-05 – 2020-05-08 (×7): 0.5 mg via ORAL
  Filled 2020-05-05 (×7): qty 1

## 2020-05-05 MED ORDER — HEPARIN BOLUS VIA INFUSION
3000.0000 [IU] | Freq: Once | INTRAVENOUS | Status: AC
  Administered 2020-05-05: 3000 [IU] via INTRAVENOUS
  Filled 2020-05-05: qty 3000

## 2020-05-05 MED ORDER — PANCRELIPASE (LIP-PROT-AMYL) 12000-38000 UNITS PO CPEP
24000.0000 [IU] | ORAL_CAPSULE | Freq: Three times a day (TID) | ORAL | Status: DC
  Administered 2020-05-05 – 2020-05-13 (×14): 24000 [IU] via ORAL
  Filled 2020-05-05 (×23): qty 2

## 2020-05-05 MED ORDER — SODIUM CHLORIDE 0.9 % IV SOLN
2.0000 g | INTRAVENOUS | Status: AC
  Administered 2020-05-05 – 2020-05-09 (×5): 2 g via INTRAVENOUS
  Filled 2020-05-05 (×2): qty 20
  Filled 2020-05-05: qty 2
  Filled 2020-05-05 (×2): qty 20

## 2020-05-05 MED ORDER — LOSARTAN POTASSIUM 50 MG PO TABS
100.0000 mg | ORAL_TABLET | Freq: Every day | ORAL | Status: DC
  Administered 2020-05-05: 100 mg via ORAL
  Filled 2020-05-05: qty 2

## 2020-05-05 MED ORDER — ALPRAZOLAM 0.5 MG PO TABS
1.0000 mg | ORAL_TABLET | Freq: Three times a day (TID) | ORAL | Status: DC | PRN
  Administered 2020-05-05 – 2020-05-10 (×6): 1 mg via ORAL
  Filled 2020-05-05 (×2): qty 2
  Filled 2020-05-05: qty 4
  Filled 2020-05-05: qty 2
  Filled 2020-05-05: qty 4
  Filled 2020-05-05 (×3): qty 2

## 2020-05-05 MED ORDER — POTASSIUM CHLORIDE CRYS ER 20 MEQ PO TBCR
40.0000 meq | EXTENDED_RELEASE_TABLET | Freq: Once | ORAL | Status: AC
  Administered 2020-05-05: 40 meq via ORAL
  Filled 2020-05-05: qty 2

## 2020-05-05 MED ORDER — CARVEDILOL 12.5 MG PO TABS
6.2500 mg | ORAL_TABLET | Freq: Two times a day (BID) | ORAL | Status: DC
  Administered 2020-05-05: 6.25 mg via ORAL
  Filled 2020-05-05: qty 1

## 2020-05-05 MED ORDER — ASPIRIN 325 MG PO TABS
325.0000 mg | ORAL_TABLET | Freq: Every day | ORAL | Status: DC
  Administered 2020-05-06 – 2020-05-08 (×3): 325 mg via ORAL
  Filled 2020-05-05 (×3): qty 1

## 2020-05-05 MED ORDER — CLOPIDOGREL BISULFATE 75 MG PO TABS
75.0000 mg | ORAL_TABLET | Freq: Every day | ORAL | Status: DC
  Administered 2020-05-05 – 2020-05-10 (×6): 75 mg via ORAL
  Filled 2020-05-05 (×6): qty 1

## 2020-05-05 MED ORDER — FUROSEMIDE 10 MG/ML IJ SOLN
40.0000 mg | Freq: Once | INTRAMUSCULAR | Status: AC
  Administered 2020-05-05: 40 mg via INTRAVENOUS
  Filled 2020-05-05: qty 4

## 2020-05-05 MED ORDER — SODIUM CHLORIDE 0.9 % IV SOLN
500.0000 mg | INTRAVENOUS | Status: AC
  Administered 2020-05-05 – 2020-05-09 (×5): 500 mg via INTRAVENOUS
  Filled 2020-05-05 (×5): qty 500

## 2020-05-05 MED ORDER — SERTRALINE HCL 100 MG PO TABS
100.0000 mg | ORAL_TABLET | Freq: Every day | ORAL | Status: DC
  Administered 2020-05-05 – 2020-05-09 (×5): 100 mg via ORAL
  Filled 2020-05-05 (×5): qty 1

## 2020-05-05 MED ORDER — SIMVASTATIN 20 MG PO TABS
40.0000 mg | ORAL_TABLET | Freq: Every day | ORAL | Status: DC
  Administered 2020-05-05 – 2020-05-06 (×2): 40 mg via ORAL
  Filled 2020-05-05 (×2): qty 2

## 2020-05-05 MED ORDER — METOPROLOL TARTRATE 50 MG PO TABS
50.0000 mg | ORAL_TABLET | Freq: Two times a day (BID) | ORAL | Status: DC
  Administered 2020-05-05 – 2020-05-08 (×6): 50 mg via ORAL
  Filled 2020-05-05 (×4): qty 2
  Filled 2020-05-05 (×2): qty 1

## 2020-05-05 MED ORDER — METOPROLOL TARTRATE 25 MG PO TABS: 50.0000 mg | ORAL_TABLET | Freq: Two times a day (BID) | ORAL | Status: DC

## 2020-05-05 MED ORDER — DILTIAZEM HCL-DEXTROSE 125-5 MG/125ML-% IV SOLN (PREMIX)
5.0000 mg/h | INTRAVENOUS | Status: DC
  Administered 2020-05-06: 5 mg/h via INTRAVENOUS
  Administered 2020-05-06 – 2020-05-07 (×3): 9.5 mg/h via INTRAVENOUS
  Administered 2020-05-07: 7.5 mg/h via INTRAVENOUS
  Administered 2020-05-08: 5.5 mg/h via INTRAVENOUS
  Filled 2020-05-05 (×6): qty 125

## 2020-05-05 MED ORDER — IOHEXOL 350 MG/ML SOLN
91.0000 mL | Freq: Once | INTRAVENOUS | Status: AC | PRN
  Administered 2020-05-05: 91 mL via INTRAVENOUS

## 2020-05-05 MED ORDER — CARVEDILOL 12.5 MG PO TABS
12.5000 mg | ORAL_TABLET | Freq: Two times a day (BID) | ORAL | Status: DC
  Administered 2020-05-05: 12.5 mg via ORAL
  Filled 2020-05-05: qty 1

## 2020-05-05 NOTE — Progress Notes (Signed)
PROGRESS NOTE  Steven Burnett OZY:[id number] DOB: 27-Jan-1961 DOA: 05/04/2020 PCP: Toma Deiters, MD  Brief History   The patient is a 59 yr old man who presented to [facility] ED on 05/04/2020 with complaints of CP, SOB, and palpitations 3 days prior to presentation. He stated that his symptoms had been worsening steadily and that nothing was making them better or worse.   Consultants  Infectious disease Cardiology  Procedures  . None  Antibiotics   Anti-infectives (From admission, onward)   Start     Dose/Rate Route Frequency Ordered Stop   05/05/20 1345  cefTRIAXone (ROCEPHIN) 2 g in sodium chloride 0.9 % 100 mL IVPB        2 g 200 mL/hr over 30 Minutes Intravenous Every 24 hours 05/05/20 1340 05/10/20 1344   05/05/20 1345  azithromycin (ZITHROMAX) 500 mg in sodium chloride 0.9 % 250 mL IVPB        500 mg 250 mL/hr over 60 Minutes Intravenous Every 24 hours 05/05/20 1340 05/10/20 1344    .  Marland Kitchen   Subjective  The patient is in moderate distress from work of breathing. Despite severe hypoxia and respiratory distress, the patient continues to attempt to talk to me about his home medications. He is advised to be quiet and focus on his breathing. FIO2 is increased to 6L.  Objective   Vitals:  Vitals:   05/05/20 1252 05/05/20 1330  BP:    Pulse:  (!) 108  Resp:  (!) 28  Temp:    SpO2: 92% 91%   Exam:  Constitutional:  . The patient is awake, alert, and oriented x 3. Moderate distress from work of breathing. Respiratory:  . Positve for increased work of breathing with tachypnea and accessory muscle use.  Marland Kitchen Positive for scattered rales. No wheezes or rhonchi . No tactile fremitus Cardiovascular:  . Tachycardic . No murmurs, ectopy, or gallups. . No lateral PMI. No thrills. Abdomen:  . Abdomen is morbidly obese. . Soft, non-tender, non-distended . No hernias, masses, or organomegaly . Normoactive bowel sounds.  Musculoskeletal:  . No cyanosis or clubbing . 2-3+ pitting  edema bilaterally. Skin:  . No rashes, lesions, ulcers . palpation of skin: no induration or nodules Neurologic:  . CN 2-12 intact . Sensation all 4 extremities intact Psychiatric:  . Mental status o Mood, affect appropriate o Orientation to person, place, time  . judgment and insight appear intact   I have personally reviewed the following:   Today's Data  . Vitals, CBC, BMP, Troponins  Micro Data  . COVID-19 negative x 2 (but only counted as one test due to only 1:18 minutes apart). Imaging  . CXR . CTA chest  Cardiology Data  . EKG x 2  Scheduled Meds: . carvedilol  12.5 mg Oral BID  . clopidogrel  75 mg Oral Daily  . fenofibrate  160 mg Oral Daily  . insulin aspart  0-15 Units Subcutaneous TID WC  . insulin aspart  0-5 Units Subcutaneous QHS  . lipase/protease/amylase  24,000 Units Oral TID AC  . pantoprazole  40 mg Oral Daily  . sertraline  100 mg Oral QHS  . sertraline  100 mg Oral Daily  . silver sulfADIAZINE   Topical Daily  . simvastatin  40 mg Oral QHS   Continuous Infusions: . azithromycin    . cefTRIAXone (ROCEPHIN)  IV    . diltiazem (CARDIZEM) infusion 15 mg/hr (05/05/20 0452)  . heparin 1,700 Units/hr (05/05/20 0729)   Problem  Acute Respiratory Failure With Hypoxia (Hcc)  Multifocal Pneumonia  Atrial Flutter With Rapid Ventricular Response (Hcc)  Suspected Covid-19 Virus Infection  Hld (Hyperlipidemia)  Cad (Coronary Artery Disease)  Htn (Hypertension)  Pancreatic Insufficiency  Dm2 (Diabetes Mellitus, Type 2) (Hcc)   Acute respiratory failure: The patient was saturating at 99% on room air at 0400 this morning. However when I entered the bay to visit him he was saturating at 84% on 4L by nasal cannula. I turned his O2 up to 6L and achieved a SaO2 of 91%. CTA of his chest was obtained that demonstrated multifocal pneumonia.   Suspicion of Covid-19: The patient is reported to have been positive for COVID-19 late in 2020. He then received two  doses of a vaccine in the Spring. The patient's son tested positive for COVID-19 10 days ago and has been living in the patient's basement for the past 10 days. In the ED the patient had negative PCR for COVID-19 x 2 taken 1 hour and 18 minutes apart. Due to the patient's rapid worsening of respiratory status and the appearance of his multifocal pneumonia on CTA chest, I maintain a level of suspicion that this patient may infact be positive again despite his previous illness and vaccinated status. I have discussed the patient with Dr. Earlene Plater from ID. He considers the 2 negative tests from the ED to be only one test due to their proximity in time. He recommends keeping the patient on precautions and testing him again tomorow. He recommends initiating conventional antibiotic therapy for pneumonia and holding off on initiation of remdesivir and steroids. I appreciate Dr. Philis Pique help.  Multifocal pneumonia: Mid to lower lung zones on CXR were indicative of an atypical or viral pneumonia that did not rule out COVID-19 infection. CTA of the chest was obtained due to the patient's rapidly worsening respiratory status. It demonstrated no evidence of a pulmonary embolism, but did indicate a right greater than left bilateral multifocal airspace opacities both ground glass and confluent. There was also shoddy right paratracheal and left superior mediastinal adenopathy. DDx included asymmetric pulmonary edema and multifocal pneumonia.   Elevated Troponins: 145, 149, 121, 133. Likely due to demand ischemia from AF with RVR and hypoxemia.  EKG's just demonstrating atrial flutter with RVR. Repeat EKG ordered. The patient will be given an aspirin. No complaints of chest pain. ? NSTEMI. Cardiology consult.  Atrial flutter with RVR/History of CAD and MI with stents x 3: The patient had been given a diltiazem bolus followed by a diltiazem gtt which left the patient with a heart rate that continued to be in the 120's -130's.  I have started the patient on an increased dose of Coreg than he usually takes at home. He has received 12.5 mg which he will get bid. Diltiazem has been stopped. His HR is now in the 90's. RVR may have played a role in possible pulmonary edema. Monitor I's & O's carefully. Monitor on telemetry.  DM II: Glucoses will be monitored with FSBS and SSI.  Pancreatic Insufficiency: Continue Creon with meals.  Hypertension: Pt is mildly hypertensive on diltiazem drip. His coreg has been increased from 6.25. to 12.5 mg. Monitor for response.  Hyperlipidemia: Continue Zocor 40 mg as at home.  I have seen and examined this patient myself. I have spent 56 minutes in his evaluation and care. More than 50% of this has been spent in coordination of care.  DVT Prophylaxis: Heparin gtt CODE STATUS: Full Code Family Communication: None available  Disposition: The patient is admitted as inpatient to a progressive bed with contact and airborne precautions. Status is: Inpatient  Remains inpatient appropriate because:IV treatments appropriate due to intensity of illness or inability to take PO   Dispo: The patient is from: Home              Anticipated d/c is to: TBD              Anticipated d/c date is: > 3 days              Patient currently is not medically stable to d/c.   LOS: 0 days

## 2020-05-05 NOTE — ED Notes (Signed)
Dr garnder notifed pt anixous at bedside, reporting change in chest pain. States increased sob and is anxious due to blood pressure. Pt continues to be in afib. Pt states he is uncomfortable due to stretcher and he normally wears cpap at home. Other vss on ccm.

## 2020-05-05 NOTE — ED Notes (Signed)
Meal tray provided, pt repositioned in bed

## 2020-05-05 NOTE — Progress Notes (Signed)
ANTICOAGULATION CONSULT NOTE  Pharmacy Consult for heparin Indication: atrial fibrillation  No Known Allergies  Patient Measurements: Height: 5\' 8"  (172.7 cm) Weight: 134.3 kg (296 lb) IBW/kg (Calculated) : 68.4 Heparin Dosing Weight: 100kg  Vital Signs: BP: 133/87 (10/16 1600) Pulse Rate: 102 (10/16 1600)  Labs: Recent Labs    05/04/20 1923 05/04/20 1923 05/04/20 2008 05/04/20 2202 05/05/20 0548 05/05/20 0550 05/05/20 0829 05/05/20 1407 05/05/20 2100  HGB 14.2   < > 14.6  --  13.9  --   --   --   --   HCT 43.7  --  43.0  --  42.3  --   --   --   --   PLT 224  --   --   --  242  --   --   --   --   HEPARINUNFRC  --   --   --   --   --  <0.10*  --  0.20* 0.21*  CREATININE 1.33*  --  1.10  --  1.33*  --   --   --   --   TROPONINIHS 117*  --   --    < > 121*  --  133* 115*  --    < > = values in this interval not displayed.    Estimated Creatinine Clearance: 81.2 mL/min (A) (by C-G formula based on SCr of 1.33 mg/dL (H)).   Medical History: Past Medical History:  Diagnosis Date  . DM2 (diabetes mellitus, type 2) (HCC)   . DVT (deep venous thrombosis) (HCC)    Provoked by knee surgery  . HLD (hyperlipidemia)   . HTN (hypertension)   . MI (myocardial infarction) (HCC) 2014   3 stents  . Pancreatitis    Assessment: 6 YOM presenting with CP/SOB and palpitations, found with new onset aflutter with RVR.  Not on anticoagulation PTA, CBC wnl.    Heparin level remains subtherapeuticat 0.2 despite increased heparin drip rate at 1850 uts/hr.  No bleeding noted   Goal of Therapy:  Heparin level 0.3-0.7 units/ml Monitor platelets by anticoagulation protocol: Yes   Plan:  Increase heparin gtt to 2050 units/hr Daily CBC/heparin level    41 Pharm.D. CPP, BCPS Clinical Pharmacist [phone] 05/05/2020 10:22 PM

## 2020-05-05 NOTE — ED Notes (Signed)
PAGED DR Julian Reil TO RN MONICA--Sharmin Foulk

## 2020-05-05 NOTE — Progress Notes (Signed)
ANTICOAGULATION CONSULT NOTE  Pharmacy Consult for heparin Indication: atrial fibrillation  No Known Allergies  Patient Measurements: Height: 5\' 8"  (172.7 cm) Weight: 134.3 kg (296 lb) IBW/kg (Calculated) : 68.4 Heparin Dosing Weight: 100kg  Vital Signs: Temp: 98.9 F (37.2 C) (10/16 0332) Temp Source: Oral (10/16 0426) BP: 144/108 (10/16 0600) Pulse Rate: 114 (10/16 0600)  Labs: Recent Labs    05/04/20 1923 05/04/20 1923 05/04/20 2008 05/04/20 2202 05/05/20 0042 05/05/20 0548 05/05/20 0550  HGB 14.2   < > 14.6  --   --  13.9  --   HCT 43.7  --  43.0  --   --  42.3  --   PLT 224  --   --   --   --  242  --   HEPARINUNFRC  --   --   --   --   --   --  <0.10*  CREATININE 1.33*  --  1.10  --   --  1.33*  --   TROPONINIHS 117*   < >  --  145* 149* 121*  --    < > = values in this interval not displayed.    Estimated Creatinine Clearance: 81.2 mL/min (A) (by C-G formula based on SCr of 1.33 mg/dL (H)).   Medical History: Past Medical History:  Diagnosis Date  . DM2 (diabetes mellitus, type 2) (HCC)   . DVT (deep venous thrombosis) (HCC)    Provoked by knee surgery  . HLD (hyperlipidemia)   . HTN (hypertension)   . MI (myocardial infarction) (HCC) 2014   3 stents  . Pancreatitis    Assessment: 31 YOM presenting with CP/SOB and palpitations, found with new onset aflutter with RVR.  Not on anticoagulation PTA, CBC wnl.    Heparin level undetectable.  Goal of Therapy:  Heparin level 0.3-0.7 units/ml Monitor platelets by anticoagulation protocol: Yes   Plan:  Give a 3000 unite bolus  Increase Heparin gtt to 1700 units/hr F/u 6 hour heparin level Daily heparin level, CBC, s/s bleeding F/u long term AC plan and transition to PO  41, PharmD, [facility] Clinical Pharmacist Please see AMION for all Pharmacists' Contact Phone Numbers 05/05/2020, 7:05 AM

## 2020-05-05 NOTE — Progress Notes (Signed)
ANTICOAGULATION CONSULT NOTE  Pharmacy Consult for heparin Indication: atrial fibrillation  No Known Allergies  Patient Measurements: Height: 5\' 8"  (172.7 cm) Weight: 134.3 kg (296 lb) IBW/kg (Calculated) : 68.4 Heparin Dosing Weight: 100kg  Vital Signs: Temp: 98.9 F (37.2 C) (10/16 0332) Temp Source: Oral (10/16 0426) BP: 149/91 (10/16 1400) Pulse Rate: 105 (10/16 1400)  Labs: Recent Labs    05/04/20 1923 05/04/20 1923 05/04/20 2008 05/04/20 2202 05/05/20 0042 05/05/20 0548 05/05/20 0550 05/05/20 0829 05/05/20 1407  HGB 14.2   < > 14.6  --   --  13.9  --   --   --   HCT 43.7  --  43.0  --   --  42.3  --   --   --   PLT 224  --   --   --   --  242  --   --   --   HEPARINUNFRC  --   --   --   --   --   --  <0.10*  --  0.20*  CREATININE 1.33*  --  1.10  --   --  1.33*  --   --   --   TROPONINIHS 117*  --   --    < > 149* 121*  --  133*  --    < > = values in this interval not displayed.    Estimated Creatinine Clearance: 81.2 mL/min (A) (by C-G formula based on SCr of 1.33 mg/dL (H)).   Medical History: Past Medical History:  Diagnosis Date  . DM2 (diabetes mellitus, type 2) (HCC)   . DVT (deep venous thrombosis) (HCC)    Provoked by knee surgery  . HLD (hyperlipidemia)   . HTN (hypertension)   . MI (myocardial infarction) (HCC) 2014   3 stents  . Pancreatitis    Assessment: Steven Burnett presenting with CP/SOB and palpitations, found with new onset aflutter with RVR.  Not on anticoagulation PTA, CBC wnl.    Heparin level remains subtherapeutic s/p rate increase to 1700 units/hr  Goal of Therapy:  Heparin level 0.3-0.7 units/ml Monitor platelets by anticoagulation protocol: Yes   Plan:  Increase heparin gtt to 1850 units/hr F/u 6 hour heparin level to confirm  41, PharmD Clinical Pharmacist ED Pharmacist Phone # [phone] 05/05/2020 3:00 PM

## 2020-05-06 DIAGNOSIS — J9601 Acute respiratory failure with hypoxia: Secondary | ICD-10-CM | POA: Diagnosis not present

## 2020-05-06 LAB — CBG MONITORING, ED
Glucose-Capillary: 113 mg/dL — ABNORMAL HIGH (ref 70–99)
Glucose-Capillary: 119 mg/dL — ABNORMAL HIGH (ref 70–99)
Glucose-Capillary: 126 mg/dL — ABNORMAL HIGH (ref 70–99)
Glucose-Capillary: 137 mg/dL — ABNORMAL HIGH (ref 70–99)
Glucose-Capillary: 144 mg/dL — ABNORMAL HIGH (ref 70–99)
Glucose-Capillary: 149 mg/dL — ABNORMAL HIGH (ref 70–99)

## 2020-05-06 LAB — COMPREHENSIVE METABOLIC PANEL
ALT: 261 U/L — ABNORMAL HIGH (ref 0–44)
AST: 462 U/L — ABNORMAL HIGH (ref 15–41)
Albumin: 3.6 g/dL (ref 3.5–5.0)
Alkaline Phosphatase: 29 U/L — ABNORMAL LOW (ref 38–126)
Anion gap: 19 — ABNORMAL HIGH (ref 5–15)
BUN: 15 mg/dL (ref 6–20)
CO2: 19 mmol/L — ABNORMAL LOW (ref 22–32)
Calcium: 8.6 mg/dL — ABNORMAL LOW (ref 8.9–10.3)
Chloride: 101 mmol/L (ref 98–111)
Creatinine, Ser: 1.81 mg/dL — ABNORMAL HIGH (ref 0.61–1.24)
GFR, Estimated: 40 mL/min — ABNORMAL LOW (ref >60)
Glucose, Bld: 164 mg/dL — ABNORMAL HIGH (ref 70–99)
Potassium: 3.4 mmol/L — ABNORMAL LOW (ref 3.5–5.1)
Sodium: 139 mmol/L (ref 135–145)
Total Bilirubin: 1.7 mg/dL — ABNORMAL HIGH (ref 0.3–1.2)
Total Protein: 6.9 g/dL (ref 6.5–8.1)

## 2020-05-06 LAB — BLOOD CULTURE ID PANEL (REFLEXED) - BCID2

## 2020-05-06 LAB — HEPARIN LEVEL (UNFRACTIONATED)
Heparin Unfractionated: 0.27 IU/mL — ABNORMAL LOW (ref 0.30–0.70)
Heparin Unfractionated: 0.45 IU/mL (ref 0.30–0.70)
Heparin Unfractionated: 0.49 IU/mL (ref 0.30–0.70)

## 2020-05-06 LAB — CBC
HCT: 42.5 % (ref 39.0–52.0)
Hemoglobin: 13.9 g/dL (ref 13.0–17.0)
MCH: 30.1 pg (ref 26.0–34.0)
MCHC: 32.7 g/dL (ref 30.0–36.0)
MCV: 92 fL (ref 80.0–100.0)
Platelets: 253 10*3/uL (ref 150–400)
RBC: 4.62 MIL/uL (ref 4.22–5.81)
RDW: 13.1 % (ref 11.5–15.5)
WBC: 11.5 10*3/uL — ABNORMAL HIGH (ref 4.0–10.5)
nRBC: 0.8 % — ABNORMAL HIGH (ref 0.0–0.2)

## 2020-05-06 LAB — LACTATE DEHYDROGENASE: LDH: 1155 U/L — ABNORMAL HIGH (ref 98–192)

## 2020-05-06 LAB — D-DIMER, QUANTITATIVE: D-Dimer, Quant: 1.3 ug/mL-FEU — ABNORMAL HIGH (ref 0.00–0.50)

## 2020-05-06 LAB — SARS CORONAVIRUS 2 BY RT PCR (HOSPITAL ORDER, PERFORMED IN ~~LOC~~ HOSPITAL LAB): SARS Coronavirus 2: NEGATIVE

## 2020-05-06 NOTE — ED Notes (Signed)
Breakfast ordered 

## 2020-05-06 NOTE — ED Notes (Signed)
Lunch Tray Ordered @ 1042. 

## 2020-05-06 NOTE — Progress Notes (Signed)
ANTICOAGULATION CONSULT NOTE  Pharmacy Consult for heparin Indication: atrial fibrillation  No Known Allergies  Patient Measurements: Height: 5\' 8"  (172.7 cm) Weight: 134.3 kg (296 lb) IBW/kg (Calculated) : 68.4 Heparin Dosing Weight: 100kg  Vital Signs: Temp: 97.7 F (36.5 C) (10/17 0900) Temp Source: Oral (10/17 0900) BP: 104/75 (10/17 1600) Pulse Rate: 92 (10/17 1415)  Labs: Recent Labs    05/04/20 1923 05/04/20 1923 05/04/20 2008 05/04/20 2202 05/05/20 0548 05/05/20 0550 05/05/20 0829 05/05/20 1407 05/05/20 1407 05/05/20 2100 05/06/20 0458 05/06/20 1530  HGB 14.2   < > 14.6   < > 13.9  --   --   --   --   --  13.9  --   HCT 43.7   < > 43.0  --  42.3  --   --   --   --   --  42.5  --   PLT 224  --   --   --  242  --   --   --   --   --  253  --   HEPARINUNFRC  --   --   --   --   --    < >  --  0.20*   < > 0.21* 0.27* 0.45  CREATININE 1.33*   < > 1.10  --  1.33*  --   --   --   --   --  1.81*  --   TROPONINIHS 117*  --   --    < > 121*  --  133* 115*  --   --   --   --    < > = values in this interval not displayed.    Estimated Creatinine Clearance: 59.7 mL/min (A) (by C-G formula based on SCr of 1.81 mg/dL (H)).   Medical History: Past Medical History:  Diagnosis Date  . DM2 (diabetes mellitus, type 2) (HCC)   . DVT (deep venous thrombosis) (HCC)    Provoked by knee surgery  . HLD (hyperlipidemia)   . HTN (hypertension)   . MI (myocardial infarction) (HCC) 2014   3 stents  . Pancreatitis    Assessment: 65 YOM presenting with CP/SOB and palpitations, found with new onset aflutter with RVR.  Not on anticoagulation PTA, CBC wnl.    Heparin level therapeutic s/p rate increase to 2150 units/hr  Goal of Therapy:  Heparin level 0.3-0.7 units/ml Monitor platelets by anticoagulation protocol: Yes   Plan:  Continue heparin gtt at 2150 units/hr F/u 6 hour heparin level to confirm  2151, PharmD Clinical Pharmacist ED Pharmacist Phone #  [phone] 05/06/2020 4:18 PM

## 2020-05-06 NOTE — ED Notes (Signed)
The pt appears  Anxious he reports that he was asleep and woke up because he could not breathe  sats 100%

## 2020-05-06 NOTE — ED Notes (Signed)
The pt has profuse sweating   No chest pain  Blood sugar good  Maybe medicines given earlier

## 2020-05-06 NOTE — ED Notes (Signed)
Pt on nasal 02 at 6 liters

## 2020-05-06 NOTE — Progress Notes (Signed)
ANTICOAGULATION CONSULT NOTE  Pharmacy Consult for heparin Indication: atrial fibrillation  No Known Allergies  Patient Measurements: Height: 5\' 8"  (172.7 cm) Weight: 134.3 kg (296 lb) IBW/kg (Calculated) : 68.4 Heparin Dosing Weight: 100kg  Vital Signs: Temp: 98.9 F (37.2 C) (10/17 0054) BP: 128/78 (10/17 0330) Pulse Rate: 116 (10/17 0330)  Labs: Recent Labs    05/04/20 1923 05/04/20 1923 05/04/20 2008 05/04/20 2202 05/05/20 0548 05/05/20 0550 05/05/20 0829 05/05/20 1407 05/05/20 2100 05/06/20 0458  HGB 14.2   < > 14.6   < > 13.9  --   --   --   --  13.9  HCT 43.7   < > 43.0  --  42.3  --   --   --   --  42.5  PLT 224  --   --   --  242  --   --   --   --  253  HEPARINUNFRC  --   --   --   --   --    < >  --  0.20* 0.21* 0.27*  CREATININE 1.33*   < > 1.10  --  1.33*  --   --   --   --  1.81*  TROPONINIHS 117*  --   --    < > 121*  --  133* 115*  --   --    < > = values in this interval not displayed.    Estimated Creatinine Clearance: 59.7 mL/min (A) (by C-G formula based on SCr of 1.81 mg/dL (H)).   Medical History: Past Medical History:  Diagnosis Date  . DM2 (diabetes mellitus, type 2) (HCC)   . DVT (deep venous thrombosis) (HCC)    Provoked by knee surgery  . HLD (hyperlipidemia)   . HTN (hypertension)   . MI (myocardial infarction) (HCC) 2014   3 stents  . Pancreatitis    Assessment: 33 YOM presenting with CP/SOB and palpitations, found with new onset aflutter with RVR.  Not on anticoagulation PTA, CBC wnl.    Heparin level remains slightly subtherapeuticat 0.27.  No bleeding noted   Goal of Therapy:  Heparin level 0.3-0.7 units/ml Monitor platelets by anticoagulation protocol: Yes   Plan:  Increase heparin gtt to 2150 units/hr Recheck HL in 8 hours Daily CBC/heparin level    2151, PharmD, [facility] Clinical Pharmacist Please see AMION for all Pharmacists' Contact Phone Numbers 05/06/2020, 7:00 AM

## 2020-05-06 NOTE — ED Notes (Signed)
Admitting doctor contacted   The pts heart rate has been increasing steadily  presentally the heart rate is 144 af  Not falling much below that

## 2020-05-06 NOTE — ED Notes (Signed)
Patient is having increase RR and WOB. He is verbalizing that he is tired. Sats now 86-87% and not coming up with cough/deep breaths and coaching. RT called and to come assess patient.

## 2020-05-06 NOTE — Progress Notes (Signed)
PROGRESS NOTE  JAHKING LESSER PJK:[id number] DOB: [birth year] DOA: 05/04/2020 PCP: Toma Deiters, MD  Brief History   The patient is a 59 yr old man who presented to [facility] ED on 05/04/2020 with complaints of CP, SOB, and palpitations 3 days prior to presentation. He stated that his symptoms had been worsening steadily and that nothing was making them better or worse.   In the ED on the morning of 05/05/2020 the patient went from saturating 99% on room air to 91% on 6L by [place]. He had tested negative for COVID-19 x2, although per ID these two tests only count as one, because of the fact that they were performed so closely in time (1:18 minutes apart). Due to the patient's history, his rapid worsening in oxygenation status, and the appearance of a multilobar pneumonia on CTA chest, I discussed the patient with infectious disease. Dr. Earlene Plater recommended maintaining precautions on the patient and repeating COVID test today. He has been started on Rocephin and azithromycin as empiric treatment for CAP. He continues to be roomed in the ED.  The patient went back in to atrial flutter with RVR overnight and has been restarted on Diltiazem drip.  Consultants  . None  Antibiotics   Anti-infectives (From admission, onward)   Start     Dose/Rate Route Frequency Ordered Stop   05/05/20 1345  cefTRIAXone (ROCEPHIN) 2 g in sodium chloride 0.9 % 100 mL IVPB        2 g 200 mL/hr over 30 Minutes Intravenous Every 24 hours 05/05/20 1340 05/10/20 1344   05/05/20 1345  azithromycin (ZITHROMAX) 500 mg in sodium chloride 0.9 % 250 mL IVPB        500 mg 250 mL/hr over 60 Minutes Intravenous Every 24 hours 05/05/20 1340 05/10/20 1344    .  Subjective  The patient is sitting up on the edge of his bed in a tripod position. He is diaphoretic and visibly dyspneic.   Objective   Vitals:  Vitals:   05/06/20 1030 05/06/20 1100  BP: (!) 130/108 116/83  Pulse:    Resp:    Temp:    SpO2: 90% 91%   Exam:  Constitutional:   The patient is awake, alert, and oriented x 3. Moderate distress from work of breathing. Respiratory:   Positve for increased work of breathing with tachypnea and accessory muscle use.   Positive for scattered rales. No wheezes or rhonchi  No tactile fremitus Cardiovascular:   Tachycardic  No murmurs, ectopy, or gallups.  No lateral PMI. No thrills. Abdomen:   Abdomen is morbidly obese.  Soft, non-tender, non-distended  No hernias, masses, or organomegaly  Normoactive bowel sounds.  Musculoskeletal:   No cyanosis or clubbing  2-3+ pitting edema bilaterally. Skin:   No rashes, lesions, ulcers  palpation of skin: no induration or nodules Neurologic:   CN 2-12 intact  Sensation all 4 extremities intact Psychiatric:   Mental status ? Mood, affect appropriate ? Orientation to person, place, time   judgment and insight appear intact   I have personally reviewed the following:   Today's Data  . Vitals, CMP, CBC, LDH, Troponin, D Dimer, Glucoses  Micro Data    COVID-19 negative x 2 (but only counted as one test due to only 1:18 minutes apart).  Imaging  . CXR . CTA Chest  Cardiology Data  . EKG x 3  Scheduled Meds: . aspirin  325 mg Oral Daily  . clopidogrel  75 mg Oral Daily  .  fenofibrate  160 mg Oral Daily  . guaiFENesin  1,200 mg Oral BID  . insulin aspart  0-15 Units Subcutaneous TID WC  . insulin aspart  0-5 Units Subcutaneous QHS  . lipase/protease/amylase  24,000 Units Oral TID AC  . metoprolol tartrate  50 mg Oral BID  . pantoprazole  40 mg Oral Daily  . sertraline  100 mg Oral QHS  . sertraline  100 mg Oral Daily  . silver sulfADIAZINE   Topical Daily  . simvastatin  40 mg Oral QHS   Continuous Infusions: . azithromycin Stopped (05/05/20 1742)  . cefTRIAXone (ROCEPHIN)  IV Stopped (05/05/20 1517)  . diltiazem (CARDIZEM) infusion 9.5 mg/hr (05/06/20 0453)  . heparin 2,150 Units/hr (05/06/20 6759)     Principal Problem:   Acute respiratory failure with hypoxia (HCC) Active Problems:   Atrial flutter with rapid ventricular response (HCC)   Suspected COVID-19 virus infection   HLD (hyperlipidemia)   CAD (coronary artery disease)   HTN (hypertension)   Pancreatic insufficiency   DM2 (diabetes mellitus, type 2) (HCC)   Multifocal pneumonia   LOS: 1 day   A & P   Acute respiratory failure: The patient was saturating at 99% on room air at 0400 this morning. However when I entered the bay to visit him he was saturating at 84% on 4L by nasal cannula. I turned his O2 up to 6L and achieved a SaO2 of 91%. CTA of his chest was obtained that demonstrated multifocal pneumonia. Today he is saturating at 94% on 4L, but with tachypnea, tri-podding, and accessory muscle use.  Suspicion of Covid-19: The patient is reported to have been positive for COVID-19 late in 2020. He then received two doses of a vaccine in the Spring. The patient's son tested positive for COVID-19 10 days ago and has been living in the patient's basement for the past 10 days. In the ED the patient had negative PCR for COVID-19 x 2 taken 1 hour and 18 minutes apart. Due to the patient's rapid worsening of respiratory status and the appearance of his multifocal pneumonia on CTA chest, I maintain a level of suspicion that this patient may infact be positive again despite his previous illness and vaccinated status. I have discussed the patient with Dr. Earlene Plater from ID. He considers the 2 negative tests from the ED to be only one test due to their proximity in time. He recommends keeping the patient on precautions and testing him again tomorow. He recommends initiating conventional antibiotic therapy for pneumonia and holding off on initiation of remdesivir and steroids. Repeat COVID has been ordered.  Multifocal pneumonia: Mid to lower lung zones on CXR were indicative of an atypical or viral pneumonia that did not rule out COVID-19  infection. CTA of the chest was obtained due to the patient's rapidly worsening respiratory status. It demonstrated no evidence of a pulmonary embolism, but did indicate a right greater than left bilateral multifocal airspace opacities both ground glass and confluent. There was also shoddy right paratracheal and left superior mediastinal adenopathy. DDx included asymmetric pulmonary edema and multifocal pneumonia due to viral pneumonia or atypical organism.  Elevated Troponins: 145, 149, 121, 133. Likely due to demand ischemia from AF with RVR and hypoxemia.  EKG's just demonstrating atrial flutter with RVR. Repeat EKG ordered. The patient will be given an aspirin. No complaints of chest pain. ? NSTEMI. I have discussed the patient with   Atrial flutter with RVR/History of CAD and MI with stents x 3:  The patient had been given a diltiazem bolus followed by a diltiazem gtt which left the patient with a heart rate that continued to be in the 120's -130's. I have started the patient on an increased dose of Coreg than he usually takes at home. He has received 12.5 mg which he will get bid. Diltiazem was stopped.However, overnight his heart rate increased up to 140. Diltiazem drip was re-started. HR is now well controlled. Will attempt to convert diltiazem to oral.  DM II: Glucoses will be monitored with FSBS and SSI.  Pancreatic Insufficiency: Continue Creon with meals.  Hypertension: Pt 's blood pressure now has improved control on diltiazem drip. His coreg has been increased from 6.25. to 12.5 mg. Monitor for response.  Hyperlipidemia: Continue Zocor 40 mg as at home.  I have seen and examined this patient myself. I have spent 36 minutes in his evaluation and care. More than 50% of this has been spent in coordination of care.  DVT Prophylaxis: Heparin gtt CODE STATUS: Full Code Family Communication: None available Disposition: The patient is admitted as inpatient to a progressive bed with  contact and airborne precautions. Status is: Inpatient  Remains inpatient appropriate because:IV treatments appropriate due to intensity of illness or inability to take PO   Dispo: The patient is from: Home  Anticipated d/c is to: TBD  Anticipated d/c date is: > 3 days  Patient currently is not medically stable to d/c.   LOS: 0 days   Kennidy Lamke, DO [facility] Direct contact: see [email or web address]  7PM-7AM contact night coverage as above 05/06/2020, 1:55 PM  LOS: 1 day

## 2020-05-06 NOTE — ED Notes (Signed)
The pt  Is c/o being hot anxiety .  Will not keeep his 02 sensor in place or his bo cuff  He also takes his 02 cannula off rapid respirations sweating  He has not slept all night

## 2020-05-06 NOTE — Progress Notes (Signed)
PHARMACY - PHYSICIAN COMMUNICATION CRITICAL VALUE ALERT - BLOOD CULTURE IDENTIFICATION (BCID)  Steven Burnett is an 59 y.o. male who presented to [facility] on 05/04/2020 with a chief complaint of SOB and CP with palpitations.  CXR with multifocal pna, currently on ceftriaxone and azithromycin  Assessment:  1/8 staph species on BCID  Name of physician (or Provider) Contacted: Swayze  Current antibiotics: Ceftriaxone/azithromycin  Changes to prescribed antibiotics recommended:  None at this time, watch Cx data and clinical status  Results for orders placed or performed during the hospital encounter of 05/04/20  Blood Culture ID Panel (Reflexed) (Collected: 05/05/2020 10:05 AM)  Result Value Ref Range   Enterococcus faecalis NOT DETECTED NOT DETECTED   Enterococcus Faecium NOT DETECTED NOT DETECTED   Listeria monocytogenes NOT DETECTED NOT DETECTED   Staphylococcus species DETECTED (A) NOT DETECTED   Staphylococcus aureus (BCID) NOT DETECTED NOT DETECTED   Staphylococcus epidermidis NOT DETECTED NOT DETECTED   Staphylococcus lugdunensis NOT DETECTED NOT DETECTED   Streptococcus species NOT DETECTED NOT DETECTED   Streptococcus agalactiae NOT DETECTED NOT DETECTED   Streptococcus pneumoniae NOT DETECTED NOT DETECTED   Streptococcus pyogenes NOT DETECTED NOT DETECTED   A.calcoaceticus-baumannii NOT DETECTED NOT DETECTED   Bacteroides fragilis NOT DETECTED NOT DETECTED   Enterobacterales NOT DETECTED NOT DETECTED   Enterobacter cloacae complex NOT DETECTED NOT DETECTED   Escherichia coli NOT DETECTED NOT DETECTED   Klebsiella aerogenes NOT DETECTED NOT DETECTED   Klebsiella oxytoca NOT DETECTED NOT DETECTED   Klebsiella pneumoniae NOT DETECTED NOT DETECTED   Proteus species NOT DETECTED NOT DETECTED   Salmonella species NOT DETECTED NOT DETECTED   Serratia marcescens NOT DETECTED NOT DETECTED   Haemophilus influenzae NOT DETECTED NOT DETECTED   Neisseria meningitidis NOT DETECTED  NOT DETECTED   Pseudomonas aeruginosa NOT DETECTED NOT DETECTED   Stenotrophomonas maltophilia NOT DETECTED NOT DETECTED   Candida albicans NOT DETECTED NOT DETECTED   Candida auris NOT DETECTED NOT DETECTED   Candida glabrata NOT DETECTED NOT DETECTED   Candida krusei NOT DETECTED NOT DETECTED   Candida parapsilosis NOT DETECTED NOT DETECTED   Candida tropicalis NOT DETECTED NOT DETECTED   Cryptococcus neoformans/gattii NOT DETECTED NOT DETECTED    Daylene Posey 05/06/2020  2:09 PM

## 2020-05-06 NOTE — ED Notes (Signed)
The pt has been coughinga lot in the past 2 hours with some blood tinged sputrum that he is con erned about  Very anxious intermittently

## 2020-05-06 NOTE — ED Notes (Signed)
trhe pt has pulled off all his leads disconnected his iv  Took off his o2 and walkedd to the br

## 2020-05-06 NOTE — Progress Notes (Signed)
ANTICOAGULATION CONSULT NOTE - Follow Up Consult  Pharmacy Consult for heparin Indication: atrial fibrillation  Labs: Recent Labs    05/04/20 1923 05/04/20 1923 05/04/20 2008 05/04/20 2202 05/05/20 0548 05/05/20 0550 05/05/20 0829 05/05/20 1407 05/05/20 2100 05/06/20 0458 05/06/20 1530 05/06/20 2200  HGB 14.2   < > 14.6   < > 13.9  --   --   --   --  13.9  --   --   HCT 43.7   < > 43.0  --  42.3  --   --   --   --  42.5  --   --   PLT 224  --   --   --  242  --   --   --   --  253  --   --   HEPARINUNFRC  --   --   --   --   --    < >  --  0.20*   < > 0.27* 0.45 0.49  CREATININE 1.33*   < > 1.10  --  1.33*  --   --   --   --  1.81*  --   --   TROPONINIHS 117*  --   --    < > 121*  --  133* 115*  --   --   --   --    < > = values in this interval not displayed.    Assessment/Plan:  59yo male remains therapeutic on heparin. Will continue gtt at current rate and monitor daily level.   Vernard Gambles, PharmD, BCPS  05/06/2020,11:33 PM

## 2020-05-07 ENCOUNTER — Inpatient Hospital Stay (HOSPITAL_COMMUNITY): Payer: BC Managed Care – PPO

## 2020-05-07 DIAGNOSIS — I10 Essential (primary) hypertension: Secondary | ICD-10-CM | POA: Diagnosis not present

## 2020-05-07 DIAGNOSIS — J9601 Acute respiratory failure with hypoxia: Secondary | ICD-10-CM | POA: Diagnosis not present

## 2020-05-07 DIAGNOSIS — I25119 Atherosclerotic heart disease of native coronary artery with unspecified angina pectoris: Secondary | ICD-10-CM | POA: Diagnosis not present

## 2020-05-07 DIAGNOSIS — I4892 Unspecified atrial flutter: Secondary | ICD-10-CM | POA: Diagnosis not present

## 2020-05-07 LAB — CBC
HCT: 39.4 % (ref 39.0–52.0)
Hemoglobin: 13.1 g/dL (ref 13.0–17.0)
MCH: 30.3 pg (ref 26.0–34.0)
MCHC: 33.2 g/dL (ref 30.0–36.0)
MCV: 91 fL (ref 80.0–100.0)
Platelets: 227 10*3/uL (ref 150–400)
RBC: 4.33 MIL/uL (ref 4.22–5.81)
RDW: 13.2 % (ref 11.5–15.5)
WBC: 8.3 10*3/uL (ref 4.0–10.5)
nRBC: 1.1 % — ABNORMAL HIGH (ref 0.0–0.2)

## 2020-05-07 LAB — BASIC METABOLIC PANEL
Anion gap: 13 (ref 5–15)
BUN: 26 mg/dL — ABNORMAL HIGH (ref 6–20)
CO2: 22 mmol/L (ref 22–32)
Calcium: 8.6 mg/dL — ABNORMAL LOW (ref 8.9–10.3)
Chloride: 104 mmol/L (ref 98–111)
Creatinine, Ser: 1.66 mg/dL — ABNORMAL HIGH (ref 0.61–1.24)
GFR, Estimated: 45 mL/min — ABNORMAL LOW (ref >60)
Glucose, Bld: 128 mg/dL — ABNORMAL HIGH (ref 70–99)
Potassium: 3.6 mmol/L (ref 3.5–5.1)
Sodium: 139 mmol/L (ref 135–145)

## 2020-05-07 LAB — HEPARIN LEVEL (UNFRACTIONATED): Heparin Unfractionated: 0.45 IU/mL (ref 0.30–0.70)

## 2020-05-07 LAB — HEMOGLOBIN A1C
Hgb A1c MFr Bld: 5.7 % — ABNORMAL HIGH (ref 4.8–5.6)
Mean Plasma Glucose: 117 mg/dL

## 2020-05-07 LAB — GLUCOSE, CAPILLARY
Glucose-Capillary: 123 mg/dL — ABNORMAL HIGH (ref 70–99)
Glucose-Capillary: 121 mg/dL — ABNORMAL HIGH (ref 70–99)
Glucose-Capillary: 111 mg/dL — ABNORMAL HIGH (ref 70–99)

## 2020-05-07 LAB — CBG MONITORING, ED: Glucose-Capillary: 113 mg/dL — ABNORMAL HIGH (ref 70–99)

## 2020-05-07 LAB — BRAIN NATRIURETIC PEPTIDE: B Natriuretic Peptide: 490.8 pg/mL — ABNORMAL HIGH (ref 0.0–100.0)

## 2020-05-07 MED ORDER — DILTIAZEM HCL 30 MG PO TABS
30.0000 mg | ORAL_TABLET | Freq: Four times a day (QID) | ORAL | Status: DC
  Administered 2020-05-07: 30 mg via ORAL
  Filled 2020-05-07: qty 1

## 2020-05-07 MED ORDER — ATORVASTATIN CALCIUM 10 MG PO TABS
20.0000 mg | ORAL_TABLET | Freq: Every day | ORAL | Status: DC
  Administered 2020-05-07 – 2020-05-08 (×2): 20 mg via ORAL
  Filled 2020-05-07 (×2): qty 2

## 2020-05-07 MED ORDER — DILTIAZEM HCL 30 MG PO TABS
30.0000 mg | ORAL_TABLET | Freq: Four times a day (QID) | ORAL | Status: DC
  Administered 2020-05-07 – 2020-05-08 (×4): 30 mg via ORAL
  Filled 2020-05-07 (×4): qty 1

## 2020-05-07 NOTE — Progress Notes (Signed)
@  1714- Received a call from CCMD saying that pt had 5 runs of vtach -Went to check on pt; pt is asleep, no complaints noted @ 1715- VS checked, stable -No further interventions at the moment

## 2020-05-07 NOTE — Progress Notes (Signed)
ANTICOAGULATION CONSULT NOTE  Pharmacy Consult for heparin Indication: atrial fibrillation  No Known Allergies  Patient Measurements: Height: 5\' 8"  (172.7 cm) Weight: 134.3 kg (296 lb) IBW/kg (Calculated) : 68.4 Heparin Dosing Weight: 100kg  Vital Signs: Temp: 97.4 F (36.3 C) (10/18 0206) Temp Source: Oral (10/18 0206) BP: 133/83 (10/18 1000) Pulse Rate: 95 (10/18 0206)  Labs: Recent Labs    05/04/20 2008 05/04/20 2202 05/05/20 0548 05/05/20 0550 05/05/20 0829 05/05/20 1407 05/05/20 2100 05/06/20 0458 05/06/20 0458 05/06/20 1530 05/06/20 2200 05/07/20 0500  HGB 14.6   < > 13.9   < >  --   --   --  13.9  --   --   --  13.1  HCT 43.0   < > 42.3  --   --   --   --  42.5  --   --   --  39.4  PLT  --    < > 242  --   --   --   --  253  --   --   --  227  HEPARINUNFRC  --   --   --    < >  --  0.20*   < > 0.27*   < > 0.45 0.49 0.45  CREATININE 1.10  --  1.33*  --   --   --   --  1.81*  --   --   --   --   TROPONINIHS  --    < > 121*  --  133* 115*  --   --   --   --   --   --    < > = values in this interval not displayed.    Estimated Creatinine Clearance: 59.7 mL/min (A) (by C-G formula based on SCr of 1.81 mg/dL (H)).   Medical History: Past Medical History:  Diagnosis Date  . DM2 (diabetes mellitus, type 2) (HCC)   . DVT (deep venous thrombosis) (HCC)    Provoked by knee surgery  . HLD (hyperlipidemia)   . HTN (hypertension)   . MI (myocardial infarction) (HCC) 2014   3 stents  . Pancreatitis    Assessment: 41 YOM presenting with CP/SOB and palpitations, found with new onset aflutter with RVR. Not on anticoagulation PTA.    Heparin level therapeutic at 0.45. CBC wnl. No active bleed issues reported.  Goal of Therapy:  Heparin level 0.3-0.7 units/ml Monitor platelets by anticoagulation protocol: Yes   Plan:  Continue heparin gtt at 2150 units/hr Monitor daily heparin level and CBC, s/sx bleeding   2151, PharmD, BCPS Please check  AMION for all [facility] contact numbers Clinical Pharmacist 05/07/2020 11:23 AM
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
## 2020-05-07 NOTE — ED Notes (Addendum)
Patient appears anxious; tugging at IV lines and removing nasal cannula, pulse oximeter, and BP cuff. Equipment reapplied, RN explained to patient need for continuous oxygen and monitoring. Tylenol given per patient's request. PRN Ativan given for anxiety.

## 2020-05-07 NOTE — ED Notes (Signed)
Patient continues to remove nasal cannula and pulse oximeter. Patient states he does not know how nasal cannula continues to come off. Nasal cannula reapplied and secured by RN.

## 2020-05-07 NOTE — ED Notes (Signed)
Ordered breakfast 

## 2020-05-07 NOTE — ED Notes (Signed)
Ice pack given to patient he stated he was hot. Temp WNL 97.5 oral.

## 2020-05-07 NOTE — Progress Notes (Signed)
   05/07/20 1100  Assess: MEWS Score  Temp 98.4 F (36.9 C)  BP 113/72  Pulse Rate (!) 105  ECG Heart Rate (!) 105  Resp (!) 27  SpO2 93 %  O2 Device HFNC  Patient Activity (if Appropriate) In bed  O2 Flow Rate (L/min) 9 L/min  Assess: MEWS Score  MEWS Temp 0  MEWS Systolic 0  MEWS Pulse 1  MEWS RR 2  MEWS LOC 0  MEWS Score 3  MEWS Score Color Yellow  Assess: if the MEWS score is Yellow or Red  Were vital signs taken at a resting state? Yes  Focused Assessment No change from prior assessment  MEWS guidelines implemented *See Row Information* Yes  Treat  Pain Scale 0-10  Pain Score 0  Take Vital Signs  Increase Vital Sign Frequency  Yellow: Q 2hr X 2 then Q 4hr X 2, if remains yellow, continue Q 4hrs  Notify: Charge Nurse/RN  Name of Charge Nurse/RN Notified Jasmina  Date Charge Nurse/RN Notified 05/07/20  Time Charge Nurse/RN Notified 1123   - Pt.'s MEWS score fired Red due to elevated HR & RR -Pt. Came due to Afib w/ RVR and is already on Cardizem drip - w/ worsening respiratory failure from ED, PCCM consulted and is currently on O2 9 L HFNC

## 2020-05-07 NOTE — Progress Notes (Signed)
PROGRESS NOTE  Steven Burnett [id number]:[id number] DOB: 1960/08/05 DOA: 05/04/2020 PCP: Toma Deiters, MD  Brief History   The patient is a 60 yr old man who presented to [facility] ED on 05/04/2020 with complaints of CP, SOB, and palpitations 3 days prior to presentation. He stated that his symptoms had been worsening steadily and that nothing was making them better or worse.   In the ED on the morning of 05/05/2020 the patient went from saturating 99% on room air to 91% on 6L by [place]. He had tested negative for COVID-19 x2, although per ID these two tests only count as one, because of the fact that they were performed so closely in time (1:18 minutes apart). Due to the patient's history, his rapid worsening in oxygenation status, and the appearance of a multilobar pneumonia on CTA chest, I discussed the patient with infectious disease. Dr. Earlene Plater recommended maintaining precautions on the patient and repeating COVID test today. He has been started on Rocephin and azithromycin as empiric treatment for CAP. He continues to be roomed in the ED. He is negative for COVID-19.  The patient went back in to atrial flutter with RVR overnight and has been restarted on Diltiazem drip. He is currently receiving 9.5 mg/hr and metoprolol 50 mg bid.  Pulmonology was consulted due to the patient's continually increasing O2 requirements. I appreciate Dr. Ulyses Jarred help.  Consultants  . Pulmonology  Antibiotics   Anti-infectives (From admission, onward)   Start     Dose/Rate Route Frequency Ordered Stop   05/05/20 1345  cefTRIAXone (ROCEPHIN) 2 g in sodium chloride 0.9 % 100 mL IVPB        2 g 200 mL/hr over 30 Minutes Intravenous Every 24 hours 05/05/20 1340 05/10/20 1344   05/05/20 1345  azithromycin (ZITHROMAX) 500 mg in sodium chloride 0.9 % 250 mL IVPB        500 mg 250 mL/hr over 60 Minutes Intravenous Every 24 hours 05/05/20 1340 05/10/20 1344     Subjective  The patient is sitting up on the edge of his bed  in a tripod position. He is diaphoretic and visibly dyspneic. He is not wearing his O2 as I enter the room. His SaO2 is 74%. O2 9L by HFNC has been replaced.   Objective   Vitals:  Vitals:   05/07/20 1015 05/07/20 1100  BP:  113/72  Pulse:  (!) 105  Resp: (!) 27 (!) 27  Temp:  98.4 F (36.9 C)  SpO2:  93%  Exam:  Constitutional:   The patient is awake, alert, and oriented x 3. Moderate distress from work of breathing. Respiratory:   Positve for increased work of breathing with tachypnea and accessory muscle use.   Positive for scattered rales. No wheezes or rhonchi  No tactile fremitus Cardiovascular:   Tachycardic  No murmurs, ectopy, or gallups.  No lateral PMI. No thrills. Abdomen:   Abdomen is morbidly obese.  Soft, non-tender, non-distended  No hernias, masses, or organomegaly  Normoactive bowel sounds.  Musculoskeletal:   No cyanosis or clubbing  2-3+ pitting edema bilaterally. Skin:   No rashes, lesions, ulcers  palpation of skin: no induration or nodules Neurologic:   CN 2-12 intact  Sensation all 4 extremities intact Psychiatric:   Mental status ? Mood, affect appropriate ? Orientation to person, place, time   judgment and insight appear intact  I have personally reviewed the following:   Today's Data  . Vitals, BMP, Glucoses  Micro Data  COVID-19 negative x 2 (but only counted as one test due to only 1:18 minutes apart). Third test performed on 05/06/2020 is negative.  Imaging  . CXR worsening perihilar airspace disease right more than left. . CTA Chest  Cardiology Data  . EKG x 3  Scheduled Meds: . aspirin  325 mg Oral Daily  . clopidogrel  75 mg Oral Daily  . fenofibrate  160 mg Oral Daily  . guaiFENesin  1,200 mg Oral BID  . insulin aspart  0-15 Units Subcutaneous TID WC  . insulin aspart  0-5 Units Subcutaneous QHS  . lipase/protease/amylase  24,000 Units Oral TID AC  . metoprolol tartrate  50 mg Oral BID  .  pantoprazole  40 mg Oral Daily  . sertraline  100 mg Oral QHS  . sertraline  100 mg Oral Daily  . silver sulfADIAZINE   Topical Daily  . simvastatin  40 mg Oral QHS   Continuous Infusions: . azithromycin Stopped (05/06/20 1620)  . cefTRIAXone (ROCEPHIN)  IV Stopped (05/06/20 1458)  . diltiazem (CARDIZEM) infusion 9.5 mg/hr (05/07/20 0542)  . heparin 2,150 Units/hr (05/07/20 0155)    Principal Problem:   Acute respiratory failure with hypoxia (HCC) Active Problems:   Atrial flutter with rapid ventricular response (HCC)   Suspected COVID-19 virus infection   HLD (hyperlipidemia)   CAD (coronary artery disease)   HTN (hypertension)   Pancreatic insufficiency   DM2 (diabetes mellitus, type 2) (HCC)   Multifocal pneumonia   LOS: 2 days   A & P   Acute respiratory failure: The patient was saturating at 99% on room air at 0400 this morning. However when I entered the bay to visit him he was saturating at 84% on 4L by nasal cannula. I turned his O2 up to 6L and achieved a SaO2 of 91%. CTA of his chest was obtained that demonstrated multifocal pneumonia. Today he is saturating at 74% on room air and 90% on 9L HFNC, but with tachypnea, tri-podding, and accessory muscle use. I appreciate Dr. Ulyses Jarred assistance. He has recommended the use of lasix if the patient's creatinine is better. It is minimally improved. Will give lasix 20mg  IV once and monitor for response.  Concern for aspiration pneumonia: Pt reportedly has had repeated episodes of vomiting followed by increased O2 requirements. The patient has been made NPO by pulmonology. Will check CT Abdomen and pelvis, but without creatinine given the patient's AKI.   Suspicion of Covid-19: The patient is reported to have been positive for COVID-19 late in 2020. He then received two doses of a vaccine in the Spring. The patient's son tested positive for COVID-19 10 days ago and has been living in the patient's basement for the past 10 days. In  the ED the patient had negative PCR for COVID-19 x 2 taken 1 hour and 18 minutes apart. Due to the patient's rapid worsening of respiratory status and the appearance of his multifocal pneumonia on CTA chest, I maintain a level of suspicion that this patient may infact be positive again despite his previous illness and vaccinated status. I have discussed the patient with Dr. [id number] from ID. He considers the 2 negative tests from the ED to be only one test due to their proximity in time. He recommends keeping the patient on precautions and testing him again tomorow. He recommends initiating conventional antibiotic therapy for pneumonia and holding off on initiation of remdesivir and steroids. Repeat COVID has been ordered and is negative.  Multifocal pneumonia: Mid to  lower lung zones on CXR were indicative of an atypical or viral pneumonia that did not rule out COVID-19 infection. CTA of the chest was obtained due to the patient's rapidly worsening respiratory status. It demonstrated no evidence of a pulmonary embolism, but did indicate a right greater than left bilateral multifocal airspace opacities both ground glass and confluent. There was also shoddy right paratracheal and left superior mediastinal adenopathy. DDx included asymmetric pulmonary edema and multifocal pneumonia due to viral pneumonia or atypical organism. The patient is receiving IV Rocephin and Azithromycin.  Elevated Troponins: 145, 149, 121, 133. Likely due to demand ischemia from AF with RVR and hypoxemia.  EKG's just demonstrating atrial flutter with RVR. Repeat EKG ordered. The patient will be given an aspirin. No complaints of chest pain. ? NSTEMI. I have discussed the patient with cardiology. They feel no further work up is necessary at this time.  Atrial flutter with RVR/History of CAD and MI with stents x 3: The patient had been given a diltiazem bolus followed by a diltiazem gtt which left the patient with a heart rate that  continued to be in the 120's -130's. I have started the patient on an increased dose of Coreg than he usually takes at home. He has received 12.5 mg which he will get bid. Diltiazem was stopped.However, overnight  05/06/2020 his heart rate increased up to 140. Diltiazem drip was re-started. HR is now well controlled. Will attempt to convert diltiazem to oral. With metoprolol 50 mg bid on board as well.  DM II: Glucoses will be monitored with FSBS and SSI.  Pancreatic Insufficiency: Continue Creon with meals.  Hypertension: Pt 's blood pressure now has improved control on diltiazem drip. His coreg has been discontinued and metoprolol started at 50mg  PO bid.  Monitor for response.  Hyperlipidemia: Continue Zocor 40 mg as at home.  I have seen and examined this patient myself. I have spent 35 minutes in his evaluation and care. More than 50% of this has been spent in coordination of care.  DVT Prophylaxis: Heparin gtt CODE STATUS: Full Code Family Communication: None available Disposition: The patient is admitted as inpatient to a progressive bed with contact and airborne precautions. Status is: Inpatient  Remains inpatient appropriate because:IV treatments appropriate due to intensity of illness or inability to take PO   Dispo: The patient is from: Home  Anticipated d/c is to: TBD  Anticipated d/c date is: > 3 days  Patient currently is not medically stable to d/c.   LOS: 0 days   Alif Petrak, DO [facility] Direct contact: see [email or web address]  7PM-7AM contact night coverage as above 05/07/2020, 12:38 PM  LOS: 1 day

## 2020-05-08 ENCOUNTER — Inpatient Hospital Stay (HOSPITAL_COMMUNITY): Payer: BC Managed Care – PPO

## 2020-05-08 ENCOUNTER — Encounter (HOSPITAL_COMMUNITY): Payer: Self-pay | Admitting: Internal Medicine

## 2020-05-08 DIAGNOSIS — J9601 Acute respiratory failure with hypoxia: Secondary | ICD-10-CM | POA: Diagnosis not present

## 2020-05-08 DIAGNOSIS — I5082 Biventricular heart failure: Secondary | ICD-10-CM

## 2020-05-08 DIAGNOSIS — E785 Hyperlipidemia, unspecified: Secondary | ICD-10-CM | POA: Diagnosis not present

## 2020-05-08 DIAGNOSIS — E119 Type 2 diabetes mellitus without complications: Secondary | ICD-10-CM | POA: Diagnosis not present

## 2020-05-08 DIAGNOSIS — I5021 Acute systolic (congestive) heart failure: Secondary | ICD-10-CM

## 2020-05-08 DIAGNOSIS — I25119 Atherosclerotic heart disease of native coronary artery with unspecified angina pectoris: Secondary | ICD-10-CM | POA: Diagnosis not present

## 2020-05-08 DIAGNOSIS — I2510 Atherosclerotic heart disease of native coronary artery without angina pectoris: Secondary | ICD-10-CM

## 2020-05-08 DIAGNOSIS — I10 Essential (primary) hypertension: Secondary | ICD-10-CM

## 2020-05-08 DIAGNOSIS — G4733 Obstructive sleep apnea (adult) (pediatric): Secondary | ICD-10-CM

## 2020-05-08 DIAGNOSIS — I4892 Unspecified atrial flutter: Secondary | ICD-10-CM

## 2020-05-08 LAB — CBC
HCT: 40.3 % (ref 39.0–52.0)
Hemoglobin: 13.1 g/dL (ref 13.0–17.0)
MCH: 30 pg (ref 26.0–34.0)
MCHC: 32.5 g/dL (ref 30.0–36.0)
MCV: 92.2 fL (ref 80.0–100.0)
Platelets: 225 10*3/uL (ref 150–400)
RBC: 4.37 MIL/uL (ref 4.22–5.81)
RDW: 13.3 % (ref 11.5–15.5)
WBC: 7.3 10*3/uL (ref 4.0–10.5)
nRBC: 1.8 % — ABNORMAL HIGH (ref 0.0–0.2)

## 2020-05-08 LAB — COMPREHENSIVE METABOLIC PANEL
ALT: 712 U/L — ABNORMAL HIGH (ref 0–44)
AST: 1323 U/L — ABNORMAL HIGH (ref 15–41)
Albumin: 3.3 g/dL — ABNORMAL LOW (ref 3.5–5.0)
Alkaline Phosphatase: 26 U/L — ABNORMAL LOW (ref 38–126)
Anion gap: 15 (ref 5–15)
BUN: 27 mg/dL — ABNORMAL HIGH (ref 6–20)
CO2: 22 mmol/L (ref 22–32)
Calcium: 8.6 mg/dL — ABNORMAL LOW (ref 8.9–10.3)
Chloride: 102 mmol/L (ref 98–111)
Creatinine, Ser: 1.58 mg/dL — ABNORMAL HIGH (ref 0.61–1.24)
GFR, Estimated: 48 mL/min — ABNORMAL LOW (ref >60)
Glucose, Bld: 122 mg/dL — ABNORMAL HIGH (ref 70–99)
Potassium: 3.4 mmol/L — ABNORMAL LOW (ref 3.5–5.1)
Sodium: 139 mmol/L (ref 135–145)
Total Bilirubin: 1.3 mg/dL — ABNORMAL HIGH (ref 0.3–1.2)
Total Protein: 6.7 g/dL (ref 6.5–8.1)

## 2020-05-08 LAB — ECHOCARDIOGRAM COMPLETE
Area-P 1/2: 3.65 cm2
Height: 68 in
S' Lateral: 4.08 cm
Weight: 4736 oz

## 2020-05-08 LAB — CULTURE, BLOOD (ROUTINE X 2)

## 2020-05-08 LAB — GLUCOSE, CAPILLARY
Glucose-Capillary: 107 mg/dL — ABNORMAL HIGH (ref 70–99)
Glucose-Capillary: 129 mg/dL — ABNORMAL HIGH (ref 70–99)
Glucose-Capillary: 142 mg/dL — ABNORMAL HIGH (ref 70–99)
Glucose-Capillary: 129 mg/dL — ABNORMAL HIGH (ref 70–99)

## 2020-05-08 LAB — MAGNESIUM: Magnesium: 1.9 mg/dL (ref 1.7–2.4)

## 2020-05-08 LAB — HEPATITIS PANEL, ACUTE
HCV Ab: NONREACTIVE
Hep A IgM: NONREACTIVE
Hep B C IgM: NONREACTIVE
Hepatitis B Surface Ag: NONREACTIVE

## 2020-05-08 LAB — FERRITIN: Ferritin: 4124 ng/mL — ABNORMAL HIGH (ref 24–336)

## 2020-05-08 LAB — C-REACTIVE PROTEIN: CRP: 21.6 mg/dL — ABNORMAL HIGH (ref <1.0)

## 2020-05-08 LAB — HEPARIN LEVEL (UNFRACTIONATED): Heparin Unfractionated: 0.41 IU/mL (ref 0.30–0.70)

## 2020-05-08 LAB — TSH: TSH: 1.255 u[IU]/mL (ref 0.350–4.500)

## 2020-05-08 LAB — STREP PNEUMONIAE URINARY ANTIGEN: Strep Pneumo Urinary Antigen: NEGATIVE

## 2020-05-08 MED ORDER — FUROSEMIDE 10 MG/ML IJ SOLN
40.0000 mg | Freq: Two times a day (BID) | INTRAMUSCULAR | Status: DC
  Administered 2020-05-08 – 2020-05-09 (×2): 40 mg via INTRAVENOUS
  Filled 2020-05-08 (×2): qty 4

## 2020-05-08 MED ORDER — POTASSIUM CHLORIDE 10 MEQ/100ML IV SOLN
10.0000 meq | INTRAVENOUS | Status: AC
  Administered 2020-05-08 (×4): 10 meq via INTRAVENOUS
  Filled 2020-05-08 (×4): qty 100

## 2020-05-08 MED ORDER — APIXABAN 5 MG PO TABS
5.0000 mg | ORAL_TABLET | Freq: Two times a day (BID) | ORAL | Status: DC
  Administered 2020-05-08 – 2020-05-09 (×2): 5 mg via ORAL
  Filled 2020-05-08 (×2): qty 1

## 2020-05-08 MED ORDER — AMIODARONE HCL IN DEXTROSE 360-4.14 MG/200ML-% IV SOLN
30.0000 mg/h | INTRAVENOUS | Status: DC
  Administered 2020-05-08 – 2020-05-09 (×2): 30 mg/h via INTRAVENOUS
  Administered 2020-05-09: 60 mg/h via INTRAVENOUS
  Administered 2020-05-10: 30 mg/h via INTRAVENOUS
  Administered 2020-05-10: 60 mg/h via INTRAVENOUS
  Administered 2020-05-10 – 2020-05-11 (×3): 30 mg/h via INTRAVENOUS
  Filled 2020-05-08 (×9): qty 200

## 2020-05-08 MED ORDER — METOPROLOL TARTRATE 50 MG PO TABS
50.0000 mg | ORAL_TABLET | Freq: Three times a day (TID) | ORAL | Status: DC
  Administered 2020-05-08 – 2020-05-09 (×3): 50 mg via ORAL
  Filled 2020-05-08 (×3): qty 1

## 2020-05-08 MED ORDER — FUROSEMIDE 10 MG/ML IJ SOLN
40.0000 mg | Freq: Once | INTRAMUSCULAR | Status: AC
  Administered 2020-05-08: 40 mg via INTRAVENOUS
  Filled 2020-05-08: qty 4

## 2020-05-08 MED ORDER — PERFLUTREN LIPID MICROSPHERE
1.0000 mL | INTRAVENOUS | Status: AC | PRN
  Administered 2020-05-08: 3 mL via INTRAVENOUS
  Filled 2020-05-08: qty 10

## 2020-05-08 MED ORDER — POTASSIUM CHLORIDE CRYS ER 20 MEQ PO TBCR
40.0000 meq | EXTENDED_RELEASE_TABLET | Freq: Once | ORAL | Status: AC
  Administered 2020-05-08: 40 meq via ORAL
  Filled 2020-05-08: qty 2

## 2020-05-08 MED ORDER — AMIODARONE HCL IN DEXTROSE 360-4.14 MG/200ML-% IV SOLN
60.0000 mg/h | INTRAVENOUS | Status: AC
  Administered 2020-05-08: 60 mg/h via INTRAVENOUS

## 2020-05-08 NOTE — Progress Notes (Signed)
ANTICOAGULATION CONSULT NOTE - Follow Up Consult  Pharmacy Consult for Heparin Indication: aflutter  No Known Allergies  Patient Measurements: Height: 5\' 8"  (172.7 cm) Weight: 134.3 kg (296 lb) IBW/kg (Calculated) : 68.4 Heparin Dosing Weight:    Vital Signs: Temp: 99.4 F (37.4 C) (10/19 0748) Temp Source: Oral (10/19 0748) BP: 140/100 (10/19 0400) Pulse Rate: 98 (10/19 0400)  Labs: Recent Labs    05/05/20 1407 05/05/20 2100 05/06/20 0458 05/06/20 0458 05/06/20 1530 05/06/20 2200 05/07/20 0500 05/07/20 1113 05/08/20 0209  HGB  --   --  13.9   < >  --   --  13.1  --  13.1  HCT  --   --  42.5  --   --   --  39.4  --  40.3  PLT  --   --  253  --   --   --  227  --  225  HEPARINUNFRC 0.20*   < > 0.27*  --    < > 0.49 0.45  --  0.41  CREATININE  --   --  1.81*  --   --   --   --  1.66* 1.58*  TROPONINIHS 115*  --   --   --   --   --   --   --   --    < > = values in this interval not displayed.    Estimated Creatinine Clearance: 68.3 mL/min (A) (by C-G formula based on SCr of 1.58 mg/dL (H)).   Assessment: Anticoag: CP/SOB and palpitations, found with new onset aflutter with RVR.  Not on anticoagulation PTA, CBC wnl. - Hep level 0.42. Hgb 13.1., Plts 225 stable.  Goal of Therapy:  Heparin level 0.3-0.7 units/ml Monitor platelets by anticoagulation protocol: Yes   Plan:  Continue heparin at 2150 uts/hr  Daily HL and CBC Hold statin?   Braysen Cloward S. [id number], PharmD, BCPS Clinical Staff Pharmacist [email or web address] Merilynn Finland, Shadman Tozzi Stillinger 05/08/2020,8:32 AM

## 2020-05-08 NOTE — Progress Notes (Addendum)
PROGRESS NOTE  Steven Burnett [id number] DOB: 1960/09/19 DOA: 05/04/2020 PCP: Toma Deiters, MD  Brief History   The patient is a 59 yr old man who presented to [facility] ED on 05/04/2020 with complaints of CP, SOB, and palpitations 3 days prior to presentation. He stated that his symptoms had been worsening steadily and that nothing was making them better or worse.   In the ED on the morning of 05/05/2020 the patient went from saturating 99% on room air to 91% on 6L by [place]. He had tested negative for COVID-19 x2, although per ID these two tests only count as one, because of the fact that they were performed so closely in time (1:18 minutes apart). Due to the patient's history, his rapid worsening in oxygenation status, and the appearance of a multilobar pneumonia on CTA chest, I discussed the patient with infectious disease. Dr. Earlene Plater recommended maintaining precautions on the patient and repeating COVID test today. He has been started on Rocephin and azithromycin as empiric treatment for CAP. He continues to be roomed in the ED. He is negative for COVID-19.  The patient went back in to atrial flutter with RVR overnight and has been restarted on Diltiazem drip. He is currently receiving 9.5 mg/hr and metoprolol 50 mg bid.  Pulmonology was consulted due to the patient's continually increasing O2 requirements. I appreciate Dr. Ulyses Jarred help.  Consultants  . Pulmonology  Antibiotics   Anti-infectives (From admission, onward)   Start     Dose/Rate Route Frequency Ordered Stop   05/05/20 1345  cefTRIAXone (ROCEPHIN) 2 g in sodium chloride 0.9 % 100 mL IVPB        2 g 200 mL/hr over 30 Minutes Intravenous Every 24 hours 05/05/20 1340 05/10/20 1344   05/05/20 1345  azithromycin (ZITHROMAX) 500 mg in sodium chloride 0.9 % 250 mL IVPB        500 mg 250 mL/hr over 60 Minutes Intravenous Every 24 hours 05/05/20 1340 05/10/20 1344     Subjective  The patient is lying in bed. He is tachypneic and  appears acutely ill. No further vomiting.  Objective   Vitals:  Vitals:   05/08/20 0945 05/08/20 1116  BP: (!) 131/97   Pulse:    Resp:    Temp:  98 F (36.7 C)  SpO2: 95%   Exam:  Constitutional:   The patient is awake, alert, and oriented x 3. Mild distress from work of breathing. Respiratory:   Positve for increased work of breathing with tachypnea and accessory muscle use.   Positive for scattered rales, more at right base. No wheezes or rhonchi  No tactile fremitus Cardiovascular:   Tachycardic  No murmurs, ectopy, or gallups.  No lateral PMI. No thrills. Abdomen:   Abdomen is morbidly obese.  Soft, tender in the epigastrum, distended  No hernias, masses, or organomegaly  Normoactive bowel sounds.  Musculoskeletal:   No cyanosis. edema, or clubbing Skin:   No rashes, lesions, ulcers  palpation of skin: no induration or nodules Neurologic:   CN 2-12 intact  Sensation all 4 extremities intact Psychiatric:   Mental status ? Mood, affect appropriate ? Orientation to person, place, time   judgment and insight appear intact  I have personally reviewed the following:   Today's Data  . Vitals, CMP, CBC, Glucoses  Micro Data    COVID-19 negative x 2 (but only counted as one test due to only 1:18 minutes apart). Third test performed on 05/06/2020 is negative.  Imaging  .  CXR worsening perihilar airspace disease right more than left. . CTA Chest  Cardiology Data  . EKG x 3  Scheduled Meds: . aspirin  325 mg Oral Daily  . clopidogrel  75 mg Oral Daily  . diltiazem  30 mg Oral Q6H  . fenofibrate  160 mg Oral Daily  . guaiFENesin  1,200 mg Oral BID  . insulin aspart  0-15 Units Subcutaneous TID WC  . insulin aspart  0-5 Units Subcutaneous QHS  . lipase/protease/amylase  24,000 Units Oral TID AC  . metoprolol tartrate  50 mg Oral BID  . pantoprazole  40 mg Oral Daily  . sertraline  100 mg Oral QHS  . sertraline  100 mg Oral Daily  .  silver sulfADIAZINE   Topical Daily   Continuous Infusions: . azithromycin 500 mg (05/07/20 1447)  . cefTRIAXone (ROCEPHIN)  IV 2 g (05/07/20 1358)  . diltiazem (CARDIZEM) infusion 5.5 mg/hr (05/08/20 0817)  . heparin 2,150 Units/hr (05/08/20 1327)  . potassium chloride 10 mEq (05/08/20 1314)    Principal Problem:   Acute respiratory failure with hypoxia (HCC) Active Problems:   Atrial flutter with rapid ventricular response (HCC)   Suspected COVID-19 virus infection   HLD (hyperlipidemia)   CAD (coronary artery disease)   HTN (hypertension)   Pancreatic insufficiency   DM2 (diabetes mellitus, type 2) (HCC)   Multifocal pneumonia   LOS: 3 days   A & P   Acute respiratory failure: The patient was saturating at 99% on room air at 0400 this morning. However when I entered the bay to visit him he was saturating at 84% on 4L by nasal cannula. I turned his O2 up to 6L and achieved a SaO2 of 91%. CTA of his chest was obtained that demonstrated multifocal pneumonia. Today he is saturating at 74% on room air and 94% on 8L [place], but with tachypnea, tri-podding, and accessory muscle use. I appreciate Dr. Ulyses Jarred assistance. He has recommended the use of lasix if the patient's creatinine is better. It is minimally improved. Will give lasix 40 mg IV once today and monitor for response.  Concern for aspiration pneumonia: Pt reportedly has had repeated episodes of vomiting followed by increased O2 requirements. The patient has been made NPO by pulmonology. Will check CT Abdomen and pelvis, but without creatinine given the patient's AKI. SLP has been consulted to evaluate the patient's swallow.  Suspicion of Covid-19: The patient is reported to have been positive for COVID-19 late in 2020. He then received two doses of a vaccine in the Spring. The patient's son tested positive for COVID-19 10 days ago and has been living in the patient's basement for the past 10 days. In the ED the patient had  negative PCR for COVID-19 x 2 taken 1 hour and 18 minutes apart. Due to the patient's rapid worsening of respiratory status and the appearance of his multifocal pneumonia on CTA chest, I maintain a level of suspicion that this patient may infact be positive again despite his previous illness and vaccinated status. I have discussed the patient with Dr. Earlene Plater from ID. He considers the 2 negative tests from the ED to be only one test due to their proximity in time. He recommends keeping the patient on precautions and testing him again tomorow. He recommends initiating conventional antibiotic therapy for pneumonia and holding off on initiation of remdesivir and steroids. Repeat COVID has been ordered and is negative.  Multifocal pneumonia: Mid to lower lung zones on CXR were indicative  of an atypical or viral pneumonia that did not rule out COVID-19 infection. CTA of the chest was obtained due to the patient's rapidly worsening respiratory status. It demonstrated no evidence of a pulmonary embolism, but did indicate a right greater than left bilateral multifocal airspace opacities both ground glass and confluent. There was also shoddy right paratracheal and left superior mediastinal adenopathy. DDx included asymmetric pulmonary edema and multifocal pneumonia due to viral pneumonia or atypical organism. The patient is receiving IV Rocephin and Azithromycin.  Elevated LFT's: Normal upon admission. Increased somewhat on 05/06/2020 with AST of 462, Alt of 261, T bili of 1.7. Initially thought to be due to COVID 19. As it now seems that the patient has CAP or aspiration pneumonia, LFT's continue to rise. Other considerations include cholangitis, cholecystitis, or passive congestion due to CHF. Right upper quadrant ultrasound has been ordered as has an echocardiogram. Hepatitis panel is also pending.  Elevated Troponins: 145, 149, 121, 133. Likely due to demand ischemia from AF with RVR and hypoxemia.  EKG's just  demonstrating atrial flutter with RVR. Repeat EKG ordered. The patient will be given an aspirin. No complaints of chest pain. ? NSTEMI. I have discussed the patient with cardiology. They feel no further work up is necessary at this time.  Atrial flutter with RVR/History of CAD and MI with stents x 3: The patient had been given a diltiazem bolus followed by a diltiazem gtt which left the patient with a heart rate that continued to be in the 120's -130's. I have started the patient on an increased dose of Coreg than he usually takes at home. He has received 12.5 mg which he will get bid. Diltiazem was stopped.However, overnight  05/06/2020 his heart rate increased up to 140. Diltiazem drip was re-started. HR is now well controlled. Will attempt to convert diltiazem to oral. With metoprolol 50 mg bid on board as well.  DM II: Glucoses will be monitored with FSBS and SSI. 107 - 129.  Pancreatic Insufficiency: Continue Creon with meals.  Hypertension: Pt 's blood pressure now has improved control on diltiazem drip. His coreg has been discontinued and metoprolol started at 50mg  PO bid.  Monitor for response.  Hyperlipidemia: Continue Zocor 40 mg as at home.  I have seen and examined this patient myself. I have spent 42 minutes in his evaluation and care. More than 50% of this has been spent in coordination of care.  DVT Prophylaxis: Heparin gtt CODE STATUS: Full Code Family Communication: None available Disposition: The patient is admitted as inpatient to a progressive bed with contact and airborne precautions. Status is: Inpatient  Remains inpatient appropriate because:IV treatments appropriate due to intensity of illness or inability to take PO   Dispo: The patient is from: Home  Anticipated d/c is to: TBD  Anticipated d/c date is: > 3 days  Patient currently is not medically stable to d/c.   LOS: 0 days   Tinamarie Przybylski, DO [facility] Direct contact: see [email or web address]  7PM-7AM contact night coverage as above 05/08/2020, 2:31 PM  LOS: 1 day   ADDENDUM: I have been contacted by Dr. 05/10/2020 and informed that the patient's echocardiogram demonstrated an EF of 20%. This is likely to explain a lot of the respiratory issues as well as the elevated LFT's. Heart failure team has been consulted to consult. I appreciate their help.

## 2020-05-08 NOTE — Progress Notes (Signed)
SLP Cancellation Note  Patient Details Name: Steven Burnett MRN: [id number] DOB: [birth year]   Cancelled treatment:       Reason Eval/Treat Not Completed: Patient not medically ready. Pt made NPO on previous date by PCCM due to persistent vomiting with concern for aspiration. Discussed with Dr. Kendrick Fries this morning, who suggests maintaining NPO status until O2 needs are reduced. Pt is currently on 8L via HFNC, and MD suggests waiting until closer to 4L and without as much N/V). Will follow along for swallowing evaluation as pt better meets this criteria.     Mahala Menghini., M.A. CCC-SLP Acute Rehabilitation Services Pager [phone] Office [phone]  05/08/2020, 10:08 AM

## 2020-05-08 NOTE — Progress Notes (Signed)
2D Echocardiogram has been completed.  Kamora Vossler, RCS 

## 2020-05-08 NOTE — Consult Note (Addendum)
Cardiology Consultation:   Patient ID: Steven Burnett MRN: [id number]; DOB: July 15, 1961  Admit date: 05/04/2020 Date of Consult: 05/08/2020  Primary Care Provider: Toma Deiters, MD [facility] Cardiologist: New to HeartCare, being seen by Dr. Royann Shivers (lives in [place], [place]) [facility] Electrophysiologist:  None    Patient Profile:   Steven Burnett is a 59 y.o. male with a hx of CAD with prior MI s/p stenting in 2014 (records not available), HTN, HLD, DM, remote pancreatitis with pancreatic insufficiency, morbid obesity, OSA compliant with CPAP, remote DVT provoked by knee surgery, former tobacco/ETOH use (quit 2014) who is being seen today for the evaluation of atrial flutter and severe heart failure at the request of Dr. Gerri Lins.   History of Present Illness:   He presented to [facility] 05/04/2020 reporting approximately 2 week history of progressive exertional dyspnea and fatigue. He would come in from doing yardwork and have to stop and rest for a while to catch his breath. He also had noted vomiting, abdominal discomfort and subjective fever 3 days prior to admission. He denies any recent chest pain or symptoms like prior angina. He has had some palpitations although not a salient complaint. His son recently completed a 10 day quarantine for Covid, diagnosed about 2 weeks ago as well. The patient himself reports a mild confirmed case of Covid in 2020 and he was fully vaccinated with Pfizer in February 2021.   Due to worsening SOB, Mr. Austen came to the ER. Tmax 100.5 in the ED. He was found to have significant hypoxemia, new onset atrial flutter RVR of unclear duration, elevated troponin, AKI with abormal creatinine (no baseline available), markedly elevated LFTs and echocardiogram showing severe biventricular heart failure. Of note, his son (who has renal transplant) has been diagnosed with Covid recently. The patient's initial CXR was concerning for atypical/viral PNA. CT angio showed no PE  but did show right greater than left bilateral airspace lung opacities, both ground-glass and more confluent, associated with lower lung zone interstitial thickening and a trace right pleural effusion, shoddy right paratracheal and left superior mediastinal adenopathy, findings consistent with multifocal PNA, consider asymmetric pulmonary edema as the etiology. However, f/u CXR yesterday was felt more asymmetric pneumonia versus asymmetric pulmonary edema, felt not typical of Covid-19 PNA. He has had negative Covid tests this admission both on 10/15 and 10/17. He has been followed by pulmonology this admission who is concerned about recurrent aspiration with significant vomiting and is receiving antibiotic coverage. For his rapid atrial fibrillation he was started on heparin and also placed on diltiazem drip for which oral diltiazem has also been added. Carvedilol was changed to metoprolol. His statin has been discontinued due to liver function abnormality. His losartan is also on hold with renal insufficiency. He has required continued oxygen supplementation currently HFNC at 8L. Labs notable for Cr 1.33->1.81->1.66->1.58, AST 1323, ALT 712, normal CBC, BNP 490, CT abd/pelvis otherwise with no acute intraabdominal or pelvic pathology, fatty liver, nonobstructing renal calculus, aortic atherosclerosis.   2D echo was obtained today showing EF <20% with global hypokinesis, moderately reduced RV function with moderately enlarged RV, moderate LAE. He received 40mg  IV Lasix earlier today. Nurse reports 2 UOP since then unable to be measured as they were mixed with stool. He has been kept NPO with suggestion to remain NPO until O2 needs are reduced, closer to 4L, pending swallow evaluation given the concern for aspiration. He had an episode of diarrhea this afternoon but otherwise denies acute complaint.  Past Medical History:  Diagnosis Date  . DM2 (diabetes mellitus, type 2) (HCC)   . DVT (deep venous  thrombosis) (HCC)    Provoked by knee surgery  . HLD (hyperlipidemia)   . HTN (hypertension)   . MI (myocardial infarction) (HCC) 2014   3 stents  . OSA (obstructive sleep apnea)   . Pancreatitis     Past Surgical History:  Procedure Laterality Date  . ABDOMINAL SURGERY    . CORONARY STENT INTERVENTION    . KNEE SURGERY       Home Medications:  Prior to Admission medications   Medication Sig Start Date End Date Taking? Authorizing Provider  acetaminophen (TYLENOL) 325 MG tablet Take 325-650 mg by mouth every 6 (six) hours as needed for mild pain or headache.   Yes [provider]  allopurinol (ZYLOPRIM) 300 MG tablet Take 300 mg by mouth 2 (two) times daily.   Yes [provider]  ALPRAZolam Prudy Feeler) 1 MG tablet Take 1 mg by mouth 3 (three) times daily.  04/04/20  Yes [provider]  aspirin EC 81 MG tablet Take 81 mg by mouth in the morning. Swallow whole.   Yes [provider]  carvedilol (COREG) 6.25 MG tablet Take 6.25 mg by mouth 2 (two) times daily. 04/20/20  Yes [provider]  clopidogrel (PLAVIX) 75 MG tablet Take 75 mg by mouth daily. 04/30/20  Yes [provider]  CREON 24000-76000 units CPEP Take 1 capsule by mouth 3 (three) times daily before meals.  04/22/20  Yes [provider]  fenofibrate (TRICOR) 145 MG tablet Take 145 mg by mouth daily.  03/11/20  Yes [provider]  Flax OIL Take 1 capsule by mouth daily.   Yes [provider]  ibuprofen (ADVIL) 200 MG tablet Take 200-400 mg by mouth every 6 (six) hours as needed for headache or mild pain.   Yes [provider]  losartan (COZAAR) 100 MG tablet Take 100 mg by mouth daily.   Yes [provider]  metFORMIN (GLUCOPHAGE) 500 MG tablet Take 500 mg by mouth daily with breakfast.   Yes [provider]  pantoprazole (PROTONIX) 40 MG tablet Take 40 mg by mouth daily. 04/23/20  Yes [provider]  sertraline  (ZOLOFT) 100 MG tablet Take 50 mg by mouth in the morning and at bedtime.  04/12/20  Yes [provider]  silver sulfADIAZINE (SILVADENE) 1 % cream Apply 1 application topically See admin instructions. Apply to affected areas of the right leg 2 times a day- apply a clean dressing afterwards 04/26/20  Yes [provider]  simvastatin (ZOCOR) 40 MG tablet Take 40 mg by mouth at bedtime. 04/28/20  Yes [provider]    Inpatient Medications: Scheduled Meds: . aspirin  325 mg Oral Daily  . clopidogrel  75 mg Oral Daily  . diltiazem  30 mg Oral Q6H  . fenofibrate  160 mg Oral Daily  . guaiFENesin  1,200 mg Oral BID  . insulin aspart  0-15 Units Subcutaneous TID WC  . insulin aspart  0-5 Units Subcutaneous QHS  . lipase/protease/amylase  24,000 Units Oral TID AC  . metoprolol tartrate  50 mg Oral BID  . pantoprazole  40 mg Oral Daily  . sertraline  100 mg Oral QHS  . sertraline  100 mg Oral Daily  . silver sulfADIAZINE   Topical Daily   Continuous Infusions: . azithromycin 500 mg (05/08/20 1531)  . cefTRIAXone (ROCEPHIN)  IV 2 g (  05/08/20 1454)  . diltiazem (CARDIZEM) infusion 5.5 mg/hr (05/08/20 0817)  . heparin 2,150 Units/hr (05/08/20 1327)   PRN Meds: ALPRAZolam, ALPRAZolam, LORazepam, ondansetron (ZOFRAN) IV  Allergies:   No Known Allergies  Social History:   Social History   Socioeconomic History  . Marital status: Married    Spouse name: Not on file  . Number of children: Not on file  . Years of education: Not on file  . Highest education level: Not on file  Occupational History  . Not on file  Tobacco Use  . Smoking status: Former Games developer  . Smokeless tobacco: Never Used  . Tobacco comment: Quit in 2014  Substance and Sexual Activity  . Alcohol use: Not Currently    Comment: Former, quit in 2014  . Drug use: Never  . Sexual activity: Not on file  Other Topics Concern  . Not on file  Social History Narrative  . Not on file   Social  Determinants of Health   Financial Resource Strain:   . Difficulty of Paying Living Expenses: Not on file  Food Insecurity:   . Worried About Programme researcher, broadcasting/film/video in the Last Year: Not on file  . Ran Out of Food in the Last Year: Not on file  Transportation Needs:   . Lack of Transportation (Medical): Not on file  . Lack of Transportation (Non-Medical): Not on file  Physical Activity:   . Days of Exercise per Week: Not on file  . Minutes of Exercise per Session: Not on file  Stress:   . Feeling of Stress : Not on file  Social Connections:   . Frequency of Communication with Friends and Family: Not on file  . Frequency of Social Gatherings with Friends and Family: Not on file  . Attends Religious Services: Not on file  . Active Member of Clubs or Organizations: Not on file  . Attends Banker Meetings: Not on file  . Marital Status: Not on file  Intimate Partner Violence:   . Fear of Current or Ex-Partner: Not on file  . Emotionally Abused: Not on file  . Physically Abused: Not on file  . Sexually Abused: Not on file    Family History:   Family History  Problem Relation Age of Onset  . Kidney failure Son        s/p transplant  . Stroke Mother      ROS:  Please see the history of present illness.  All other ROS reviewed and negative.     Physical Exam/Data:   Vitals:   05/08/20 0400 05/08/20 0748 05/08/20 0945 05/08/20 1116  BP: (!) 140/100  (!) 131/97   Pulse: 98     Resp: (!) 21 (!) 25    Temp: 98.7 F (37.1 C) 99.4 F (37.4 C)  98 F (36.7 C)  TempSrc: Oral Oral  Oral  SpO2: 94% 96% 95%   Weight:      Height:        Intake/Output Summary (Last 24 hours) at 05/08/2020 1716 Last data filed at 05/08/2020 0843 Gross per 24 hour  Intake 579.41 ml  Output 360 ml  Net 219.41 ml   Last 3 Weights 05/04/2020  Weight (lbs) 296 lb  Weight (kg) 134.265 kg     Body mass index is 45.01 kg/m.  Vital Signs. BP (!) 131/97   Pulse 98   Temp 98 F (36.7  C) (Oral)   Resp (!) 25   Ht 5\' 8"  (1.727 m)  Wt 134.3 kg   SpO2 95%   BMI 45.01 kg/m  Exam per MD: General: Morbidly obese AAM in no acute distress. Head: Normocephalic, atraumatic, sclera non-icteric, no xanthomas, nares are without discharge. Neck: Negative for carotid bruits. JVP not elevated. Lungs: Clear bilaterally to auscultation without wheezes, rales, or rhonchi. Breathing is unlabored. Heart: Tachycardic, regular, S1 S2 without murmurs, rubs, or gallops.  Abdomen: Soft, non-tender, rounded with normoactive bowel sounds. No rebound/guarding. Extremities: No clubbing or cyanosis. No edema. Distal pedal pulses are 2+ and equal bilaterally. Neuro: Alert and oriented X 3. Moves all extremities spontaneously. Psych:  Responds to questions appropriately with a normal affect.   EKG:  The EKG was personally reviewed and demonstrates:  Reviewed multiple tracings - show atrial flutter with HR 140s, NSIVCD with QRS , nonspecific STT changes, possible prolonged QT but challenging to assess given rapid rate/flutter   Telemetry:  Telemetry was personally reviewed and demonstrates: persistent atrial flutter with HR around 100-110  Relevant CV Studies: 2D Echo today 1. Left ventricular ejection fraction, by estimation, is <20%. The left  ventricle has severely decreased function. The left ventricle demonstrates  global hypokinesis. Left ventricular diastolic parameters are  indeterminate.  2. Right ventricular systolic function is moderately reduced. The right  ventricular size is moderately enlarged.  3. Left atrial size was moderately dilated.  4. Right atrial size was mildly dilated.  5. The mitral valve is grossly normal. Trivial mitral valve  regurgitation.  6. The aortic valve was not well visualized. Aortic valve regurgitation  is not visualized.   Laboratory Data:  High Sensitivity Troponin:   Recent Labs  Lab 05/04/20 2202 05/05/20 0042 05/05/20 0548  05/05/20 0829 05/05/20 1407  TROPONINIHS 145* 149* 121* 133* 115*     Chemistry Recent Labs  Lab 05/06/20 0458 05/07/20 1113 05/08/20 0209  NA 139 139 139  K 3.4* 3.6 3.4*  CL 101 104 102  CO2 19* 22 22  GLUCOSE 164* 128* 122*  BUN 15 26* 27*  CREATININE 1.81* 1.66* 1.58*  CALCIUM 8.6* 8.6* 8.6*  GFRNONAA 40* 45* 48*  ANIONGAP 19* 13 15    Recent Labs  Lab 05/04/20 1923 05/06/20 0458 05/08/20 0209  PROT 6.7 6.9 6.7  ALBUMIN 3.8 3.6 3.3*  AST 37 462* 1,323*  ALT 42 261* 712*  ALKPHOS 30* 29* 26*  BILITOT 1.3* 1.7* 1.3*   Hematology Recent Labs  Lab 05/06/20 0458 05/07/20 0500 05/08/20 0209  WBC 11.5* 8.3 7.3  RBC 4.62 4.33 4.37  HGB 13.9 13.1 13.1  HCT 42.5 39.4 40.3  MCV 92.0 91.0 92.2  MCH 30.1 30.3 30.0  MCHC 32.7 33.2 32.5  RDW 13.1 13.2 13.3  PLT 253 227 225   BNP Recent Labs  Lab 05/07/20 1113  BNP 490.8*    DDimer  Recent Labs  Lab 05/04/20 1923 05/06/20 0458  DDIMER 0.40 1.30*     Radiology/Studies:  CT ABDOMEN PELVIS WO CONTRAST  Result Date: 05/07/2020 CLINICAL DATA:  59 year old male with abdominal pain. Concern for perforation or peritonitis. EXAM: CT ABDOMEN AND PELVIS WITHOUT CONTRAST TECHNIQUE: Multidetector CT imaging of the abdomen and pelvis was performed following the standard protocol without IV contrast. COMPARISON:  None. FINDINGS: Evaluation of this exam is limited in the absence of intravenous contrast. Lower chest: Three-vessel coronary vascular calcification noted. There is diffuse interstitial prominence and hazy airspace opacity in the visualized lung bases which may represent edema or pneumonia. Clinical correlation is recommended. No intra-abdominal free air or  free fluid. Hepatobiliary: Diffuse fatty liver. No intrahepatic biliary dilatation. The gallbladder is unremarkable. Pancreas: Unremarkable. No pancreatic ductal dilatation or surrounding inflammatory changes. Spleen: Normal in size without focal abnormality.  Adrenals/Urinary Tract: The adrenal glands unremarkable. There is a punctate nonobstructing left renal interpolar calculus. No hydronephrosis. The right kidney is unremarkable. The visualized ureters and urinary bladder appear unremarkable. Stomach/Bowel: There is sigmoid diverticulosis without active inflammatory changes. There is no bowel obstruction or active inflammation. The appendix is normal. Vascular/Lymphatic: Mild aortoiliac atherosclerotic disease. The IVC is unremarkable. No portal venous gas. There is no adenopathy. Reproductive: The prostate and seminal vesicles are grossly unremarkable. Other: Midline vertical anterior abdominal wall incisional scar. Musculoskeletal: No acute or significant osseous findings. Degenerative changes. IMPRESSION: 1. Bilateral lower lung field pulmonary opacities may represent edema or pneumonia. Clinical correlation is recommended. 2. No acute intra-abdominal or pelvic pathology. No bowel obstruction. Normal appendix. 3. Fatty liver. 4. Punctate nonobstructing left renal interpolar calculus. No hydronephrosis. 5. Aortic Atherosclerosis (ICD10-I70.0). Electronically Signed   By: Elgie Collard M.D.   On: 05/07/2020 22:45   CT ANGIO CHEST PE W OR WO CONTRAST  Result Date: 05/05/2020 CLINICAL DATA:  Chest pain and shortness of breath. EXAM: CT ANGIOGRAPHY CHEST WITH CONTRAST TECHNIQUE: Multidetector CT imaging of the chest was performed using the standard protocol during bolus administration of intravenous contrast. Multiplanar CT image reconstructions and MIPs were obtained to evaluate the vascular anatomy. CONTRAST:  32mL OMNIPAQUE IOHEXOL 350 MG/ML SOLN COMPARISON:  Chest radiograph dated 05/04/2020. FINDINGS: Cardiovascular: There is satisfactory opacification of the pulmonary arteries to the segmental level. There is no evidence of a pulmonary embolism. Heart is normal in size. No pericardial effusion. Three-vessel coronary artery calcifications. Great vessels are  normal in caliber. Mediastinum/Nodes: Shotty right paratracheal adenopathy is noted, largest node measuring approximately 1.6 cm in short axis left paratracheal superior mediastinal node measures 1.7 cm in short axis no mediastinal or hilar masses. Trachea and esophagus are unremarkable. Lungs/Pleura: There are bilateral patchy ground-glass and more confluent airspace opacities, greater on the right. Interstitial thickening is noted in the lower lungs. Trace right pleural effusion. No pneumothorax. Upper Abdomen: No acute findings. Decreased attenuation of the liver consistent with fatty infiltration. Tiny nonobstructing stone in the visualized left kidney. Musculoskeletal: No fracture or acute finding. No osteoblastic or osteolytic lesions. Review of the MIP images confirms the above findings. IMPRESSION: 1. No evidence of a pulmonary embolism. 2. Right greater than left bilateral airspace lung opacities, both ground-glass and more confluent, associated with lower lung zone interstitial thickening and a trace right pleural effusion. There is also mild shoddy right paratracheal and left superior mediastinal adenopathy. Findings consistent with multifocal pneumonia. Consider asymmetric pulmonary edema as the etiology or a component if symptoms are atypical for pneumonia. Electronically Signed   By: Amie Portland M.D.   On: 05/05/2020 12:45   DG CHEST PORT 1 VIEW  Result Date: 05/07/2020 CLINICAL DATA:  Acute respiratory failure. EXAM: PORTABLE CHEST 1 VIEW COMPARISON:  05/04/2020 radiography.  CT 05/05/2020. FINDINGS: Worsening of perihilar airspace filling right more than left. Differential diagnosis remains that of asymmetric pneumonia versus is asymmetric pulmonary edema. Infectious pneumonia is favored. The pattern does not appear typical of coronavirus pneumonia by imaging. IMPRESSION: Worsening of perihilar airspace filling right more than left. Ejection and favored over asymmetric edema. Pattern is not  typical of coronavirus. Electronically Signed   By: Paulina Fusi M.D.   On: 05/07/2020 09:19   DG Chest Port 1  View  Result Date: 05/04/2020 CLINICAL DATA:  Chest pain, shortness of breath. Patient max needed but living with sign waist COVID positive. EXAM: PORTABLE CHEST 1 VIEW COMPARISON:  Chest x-ray 10/24/2008 report without images FINDINGS: The heart size and mediastinal contours are within normal limits with mildly prominent cardiac silhouette likely due to AP portable technique. Increased interstitial markings and airspace opacities of the mid to lower lung zones. No pleural effusion. No pneumothorax. No acute osseous abnormality. IMPRESSION: Findings suggestive of atypical/viral pneumonia. COVID-19 infection not excluded. Electronically Signed   By: Tish Frederickson M.D.   On: 05/04/2020 20:47   ECHOCARDIOGRAM COMPLETE  Result Date: 05/08/2020    ECHOCARDIOGRAM REPORT   Patient Name:   ADRIELL POLANSKY Stitt Date of Exam: 05/08/2020 Medical Rec #:  [id number]     Height:       68.0 in Accession #:    [id number]    Weight:       296.0 lb Date of Birth:  [birth year]      BSA:          2.414 m Patient Age:    59 years      BP:           132/107 mmHg Patient Gender: M             HR:           90 bpm. Exam Location:  Inpatient Procedure: 2D Echo, Cardiac Doppler, Color Doppler and Intracardiac            Opacification Agent Indications:    I48.0 Paroxysmal atrial fibrillation  History:        Patient has no prior history of Echocardiogram examinations.                 Previous Myocardial Infarction; Risk Factors:Hypertension,                 Diabetes and Dyslipidemia.  Sonographer:    Elmarie Shiley Dance Referring Phys: [address] F GROCE  Sonographer Comments: No subcostal window. IMPRESSIONS  1. Left ventricular ejection fraction, by estimation, is <20%. The left ventricle has severely decreased function. The left ventricle demonstrates global hypokinesis. Left ventricular diastolic parameters are indeterminate.  2.  Right ventricular systolic function is moderately reduced. The right ventricular size is moderately enlarged.  3. Left atrial size was moderately dilated.  4. Right atrial size was mildly dilated.  5. The mitral valve is grossly normal. Trivial mitral valve regurgitation.  6. The aortic valve was not well visualized. Aortic valve regurgitation is not visualized. Comparison(s): No prior Echocardiogram. Conclusion(s)/Recommendation(s): Biventricular Failure. Reaching out to cardiology consultation service. FINDINGS  Left Ventricle: Left ventricular ejection fraction, by estimation, is <20%. The left ventricle has severely decreased function. The left ventricle demonstrates global hypokinesis. Definity contrast agent was given IV to delineate the left ventricular endocardial borders. The left ventricular internal cavity size was normal in size. There is no left ventricular hypertrophy. Left ventricular diastolic parameters are indeterminate. Right Ventricle: The right ventricular size is moderately enlarged. Right vetricular wall thickness was not well visualized. Right ventricular systolic function is moderately reduced. Left Atrium: Left atrial size was moderately dilated. Right Atrium: Right atrial size was mildly dilated. Pericardium: There is no evidence of pericardial effusion. Mitral Valve: The mitral valve is grossly normal. Trivial mitral valve regurgitation. Tricuspid Valve: The tricuspid valve is not well visualized. Tricuspid valve regurgitation is not demonstrated. Aortic Valve: The aortic valve was not well visualized. Aortic valve  regurgitation is not visualized. Pulmonic Valve: The pulmonic valve was not well visualized. Pulmonic valve regurgitation is not visualized. Aorta: The aortic root is normal in size and structure. Venous: The pulmonary veins were not well visualized. The inferior vena cava was not well visualized. IAS/Shunts: The interatrial septum was not well visualized.  LEFT VENTRICLE PLAX  2D LVIDd:         5.12 cm LVIDs:         4.08 cm LV PW:         1.15 cm LV IVS:        1.09 cm LVOT diam:     2.00 cm LV SV:         24 LV SV Index:   10 LVOT Area:     3.14 cm  RIGHT VENTRICLE RV Basal diam:  3.32 cm RV S prime:     5.87 cm/s TAPSE (M-mode): 1.4 cm LEFT ATRIUM             Index       RIGHT ATRIUM           Index LA diam:        4.20 cm 1.74 cm/m  RA Area:     19.70 cm LA Vol (A2C):   86.8 ml 35.95 ml/m RA Volume:   56.70 ml  23.49 ml/m LA Vol (A4C):   92.7 ml 38.40 ml/m LA Biplane Vol: 89.4 ml 37.03 ml/m  AORTIC VALVE LVOT Vmax:   56.40 cm/s LVOT Vmean:  34.850 cm/s LVOT VTI:    0.076 m  AORTA Ao Root diam: 3.30 cm Ao Asc diam:  3.60 cm MITRAL VALVE MV Area (PHT): 3.65 cm     SHUNTS MV Decel Time: 208 msec     Systemic VTI:  0.08 m MV E velocity: 108.00 cm/s  Systemic Diam: 2.00 cm Riley Lam MD Electronically signed by Riley Lam MD Signature Date/Time: 05/08/2020/3:18:13 PM    Final    US Abdomen Limited RUQ (LIVER/GB)  Result Date: 05/08/2020 CLINICAL DATA:  Elevated LFT EXAM: ULTRASOUND ABDOMEN LIMITED RIGHT UPPER QUADRANT COMPARISON:  CT 05/07/2020 FINDINGS: Gallbladder: No gallstones or wall thickening visualized. No sonographic Murphy sign noted by sonographer. Common bile duct: Diameter: Unable to visualize due to bowel gas and habitus. Liver: Diffusely echogenic. Left lobe not well evaluated. No definite focal hepatic abnormality. Portal vein is patent on color Doppler imaging with normal direction of blood flow towards the liver. Other: None. IMPRESSION: 1. Diffusely echogenic liver consistent with hepatic steatosis and or hepatocellular disease. 2. Unable to visualize common bile duct due to habitus and gas. Electronically Signed   By: Jasmine Pang M.D.   On: 05/08/2020 16:40     Assessment and Plan:   1. Acute hypoxic respiratory failure - suspect mixed picture, acute Covid ruled out (cannot exclude complications of prior Covid infection) but  suspect component of severe heart failure as well - pending swallow study for aspiration - follow O2 demands with diuresis - CPAP QHS for OSA  2. Acute biventricular heart failure  - etiology unknown at this point - ?tachy-mediated, myocarditis, ischemia in differential although tachy-mediated seems most likely at this point given lack of angina and global hypokinesis - we will discontinue diltiazem (IV and oral) and increase metoprolol given LV dysfunction - MD feels not shocky on exam - start scheduled Lasix 40mg  IV BID (received 1 dose on admission and today) and follow strict I/O's and daily weights  3. Rapid atrial flutter  of unclear duration - rate slightly improved to 90s-100s on diltiazem and metoprolol but will need to stop diltiazem (IV + oral) due to severe LV dysfunction - Dr. Royann Shivers suggests addition of IV amiodarone without bolus and increase in metoprolol to TID - transition heparin to Eliquis - anticipate TEE/DCCV this admission once oxygen status more stable  4. Acute kidney injury without recent baseline available - follow closely in context of the above  5. Transaminitis - ?possibly related to severe CHF - hepatitis panel nonacute - avoid statin, Tylenol  6. Elevated troponin with known history of CAD s/p MI/PCI 2014 - hsTroponin [id number] - suspect demand ischemia given relatively low flat trend but given severe CHF, cannot exclude underlying CAD although has not had any convincing angina recently so plan to focus on rx of arrhythmia and CHF to start - d/c aspirin since starting Eliquis, continue Plavix  6. Hypokalemia - primary team repleted via IV form earlier today - give another this evening with Lasix and follow up BMET in AM to determine plan for additional dosing  Complex clinical picture, patient seen in conjunction with MD. Further recs as outlined below.     New York Heart Association (NYHA) Functional Class NYHA Class IV     CHA2DS2-VASc Score = 6 This indicates a 9% annual risk of stroke. The patient's score is based upon: CHF History: 1 HTN History: 1 Diabetes History: 1 Stroke or VTE History: 2 Vascular Disease History: 1 Age Score: 0 Gender Score: 0      For questions or updates, please contact [facility] Please consult [email or web address] for contact info under    Signed, Laurann Montana, PA-C  05/08/2020 5:16 PM  I have seen and examined the patient along with Laurann Montana, PA-C.  I have reviewed the chart, notes and new data.  I agree with PA/NP's note.  Key new complaints: he has gradual onset of severe dyspnea and has not had angina, despite the fact that he clearly remembers the angina he had with his MI. He has not   Key examination changes: obese, very short neck, crowded oropharynx, impossible to see JVD, no carotid bruits, bilateral rales, irregular rhythm, hard to define PMI, no murmurs/rubs, +ve gallop, warm extremities and normal distal pulses, mild ankle edema, laparotomy scar, normal bowel sounds, probably hepatomegaly, normal neurological exam Key new findings / data: echo with severe global LV hypokinesis, enlarged and depressed RV, biatrial dilation, no major valve problems. Imaging studies are compatible with CHF, cannot exclude superimposed pneumonia. All ECGs since admission show atrial flutter (typical counterclockwise RA flutter) with varying degrees of AV block. Normal WBC. Elevated LFTs probably due to passive congestion.   PLAN: Suspect acute systolic HF with pulmonary edema. In turn, underlying cause may be sustained rapid rates due to atrial flutter, possibly going on for several weeks. He has known CAD, but has not had angina and LV dysfunction is global. Although he reports 100% faithful compliance with CPAP, there are echo indications that the treatment for his OSA may be suboptimal, which may be driving the AFlutter. Prefer amiodarone to diltiazem in setting of  decompensated CHF. Increase diuretics. May consider TEE-cardioversion later if we can get his O2 requirements down to a range safe for sedation and esophageal intubation. Eliquis (do not anticipate cardiac cath unless LVEF fails to improve with rhythm/rate control; outpt ischemia workup versus inpatient nuclear scan). Stop ASA and continue clopidogrel after adding DOAC.  Thurmon Fair, MD, [facility] [phone]  05/08/2020, 5:47 PM
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
## 2020-05-09 ENCOUNTER — Inpatient Hospital Stay (HOSPITAL_COMMUNITY): Payer: BC Managed Care – PPO

## 2020-05-09 DIAGNOSIS — I4892 Unspecified atrial flutter: Secondary | ICD-10-CM | POA: Diagnosis not present

## 2020-05-09 DIAGNOSIS — R570 Cardiogenic shock: Secondary | ICD-10-CM

## 2020-05-09 DIAGNOSIS — I5021 Acute systolic (congestive) heart failure: Secondary | ICD-10-CM

## 2020-05-09 DIAGNOSIS — E119 Type 2 diabetes mellitus without complications: Secondary | ICD-10-CM | POA: Diagnosis not present

## 2020-05-09 DIAGNOSIS — I2510 Atherosclerotic heart disease of native coronary artery without angina pectoris: Secondary | ICD-10-CM | POA: Diagnosis not present

## 2020-05-09 DIAGNOSIS — J9601 Acute respiratory failure with hypoxia: Secondary | ICD-10-CM | POA: Diagnosis not present

## 2020-05-09 LAB — HEPATIC FUNCTION PANEL
ALT: 556 U/L — ABNORMAL HIGH (ref 0–44)
AST: 572 U/L — ABNORMAL HIGH (ref 15–41)
Albumin: 3.2 g/dL — ABNORMAL LOW (ref 3.5–5.0)
Alkaline Phosphatase: 29 U/L — ABNORMAL LOW (ref 38–126)
Bilirubin, Direct: 0.4 mg/dL — ABNORMAL HIGH (ref 0.0–0.2)
Indirect Bilirubin: 0.4 mg/dL (ref 0.3–0.9)
Total Bilirubin: 0.8 mg/dL (ref 0.3–1.2)
Total Protein: 6.8 g/dL (ref 6.5–8.1)

## 2020-05-09 LAB — COOXEMETRY PANEL
Carboxyhemoglobin: 0.8 % (ref 0.5–1.5)
Carboxyhemoglobin: 0.8 % (ref 0.5–1.5)
Methemoglobin: 0.8 % (ref 0.0–1.5)
Methemoglobin: 0.8 % (ref 0.0–1.5)
O2 Saturation: 50.6 %
O2 Saturation: 47.1 %
Total hemoglobin: 13.6 g/dL (ref 12.0–16.0)
Total hemoglobin: 13.3 g/dL (ref 12.0–16.0)

## 2020-05-09 LAB — CBC
HCT: 42 % (ref 39.0–52.0)
Hemoglobin: 13.6 g/dL (ref 13.0–17.0)
MCH: 30.2 pg (ref 26.0–34.0)
MCHC: 32.4 g/dL (ref 30.0–36.0)
MCV: 93.1 fL (ref 80.0–100.0)
Platelets: 231 10*3/uL (ref 150–400)
RBC: 4.51 MIL/uL (ref 4.22–5.81)
RDW: 13.2 % (ref 11.5–15.5)
WBC: 7.2 10*3/uL (ref 4.0–10.5)
nRBC: 2.9 % — ABNORMAL HIGH (ref 0.0–0.2)

## 2020-05-09 LAB — CULTURE, BLOOD (ROUTINE X 2)
Culture: NO GROWTH
Culture: NO GROWTH
Special Requests: ADEQUATE
Special Requests: ADEQUATE

## 2020-05-09 LAB — BASIC METABOLIC PANEL
Anion gap: 14 (ref 5–15)
BUN: 31 mg/dL — ABNORMAL HIGH (ref 6–20)
CO2: 20 mmol/L — ABNORMAL LOW (ref 22–32)
Calcium: 8.6 mg/dL — ABNORMAL LOW (ref 8.9–10.3)
Chloride: 105 mmol/L (ref 98–111)
Creatinine, Ser: 1.49 mg/dL — ABNORMAL HIGH (ref 0.61–1.24)
GFR, Estimated: 51 mL/min — ABNORMAL LOW (ref >60)
Glucose, Bld: 122 mg/dL — ABNORMAL HIGH (ref 70–99)
Potassium: 4.2 mmol/L (ref 3.5–5.1)
Sodium: 139 mmol/L (ref 135–145)

## 2020-05-09 LAB — MRSA PCR SCREENING: MRSA by PCR: NEGATIVE

## 2020-05-09 LAB — GLUCOSE, CAPILLARY
Glucose-Capillary: 127 mg/dL — ABNORMAL HIGH (ref 70–99)
Glucose-Capillary: 131 mg/dL — ABNORMAL HIGH (ref 70–99)
Glucose-Capillary: 119 mg/dL — ABNORMAL HIGH (ref 70–99)

## 2020-05-09 LAB — LACTIC ACID, PLASMA: Lactic Acid, Venous: 1.9 mmol/L (ref 0.5–1.9)

## 2020-05-09 LAB — AMMONIA: Ammonia: 32 umol/L (ref 9–35)

## 2020-05-09 LAB — MAGNESIUM: Magnesium: 2.1 mg/dL (ref 1.7–2.4)

## 2020-05-09 MED ORDER — DIGOXIN 125 MCG PO TABS
0.1250 mg | ORAL_TABLET | Freq: Every day | ORAL | Status: DC
  Administered 2020-05-09 – 2020-05-13 (×5): 0.125 mg via ORAL
  Filled 2020-05-09 (×5): qty 1

## 2020-05-09 MED ORDER — SERTRALINE HCL 25 MG PO TABS
50.0000 mg | ORAL_TABLET | Freq: Two times a day (BID) | ORAL | Status: DC
  Administered 2020-05-09 – 2020-05-13 (×8): 50 mg via ORAL
  Filled 2020-05-09 (×2): qty 1
  Filled 2020-05-09 (×3): qty 2
  Filled 2020-05-09: qty 1
  Filled 2020-05-09: qty 2
  Filled 2020-05-09: qty 1

## 2020-05-09 MED ORDER — CHLORHEXIDINE GLUCONATE CLOTH 2 % EX PADS: 6.0000 | MEDICATED_PAD | Freq: Every day | CUTANEOUS | Status: DC

## 2020-05-09 MED ORDER — FUROSEMIDE 10 MG/ML IJ SOLN
80.0000 mg | Freq: Once | INTRAMUSCULAR | Status: AC
  Administered 2020-05-09: 80 mg via INTRAVENOUS
  Filled 2020-05-09: qty 8

## 2020-05-09 MED ORDER — SODIUM CHLORIDE 0.9% FLUSH
3.0000 mL | Freq: Two times a day (BID) | INTRAVENOUS | Status: DC
  Administered 2020-05-09 – 2020-05-13 (×5): 3 mL via INTRAVENOUS

## 2020-05-09 MED ORDER — HEPARIN (PORCINE) 25000 UT/250ML-% IV SOLN
2100.0000 [IU]/h | INTRAVENOUS | Status: DC
  Administered 2020-05-09 – 2020-05-10 (×2): 2100 [IU]/h via INTRAVENOUS
  Filled 2020-05-09 (×2): qty 250

## 2020-05-09 MED ORDER — FUROSEMIDE 10 MG/ML IJ SOLN
80.0000 mg | Freq: Two times a day (BID) | INTRAMUSCULAR | Status: DC
  Administered 2020-05-09 – 2020-05-12 (×6): 80 mg via INTRAVENOUS
  Filled 2020-05-09 (×6): qty 8

## 2020-05-09 MED ORDER — POTASSIUM CHLORIDE CRYS ER 20 MEQ PO TBCR: 40.0000 meq | EXTENDED_RELEASE_TABLET | Freq: Once | ORAL | Status: DC

## 2020-05-09 MED ORDER — FUROSEMIDE 10 MG/ML IJ SOLN: 80.0000 mg | Freq: Two times a day (BID) | INTRAMUSCULAR | Status: DC

## 2020-05-09 MED ORDER — DIGOXIN 125 MCG PO TABS: 0.1250 mg | ORAL_TABLET | Freq: Every day | ORAL | Status: DC

## 2020-05-09 MED ORDER — LOPERAMIDE HCL 1 MG/7.5ML PO SUSP
2.0000 mg | ORAL | Status: DC | PRN
  Administered 2020-05-09 – 2020-05-10 (×2): 2 mg via ORAL
  Filled 2020-05-09 (×4): qty 15

## 2020-05-09 MED ORDER — DIGOXIN 125 MCG PO TABS: 0.2500 mg | ORAL_TABLET | Freq: Four times a day (QID) | ORAL | Status: DC

## 2020-05-09 MED ORDER — SERTRALINE HCL 50 MG PO TABS: 50.0000 mg | ORAL_TABLET | Freq: Every day | ORAL | Status: DC

## 2020-05-09 MED ORDER — CHLORHEXIDINE GLUCONATE CLOTH 2 % EX PADS
6.0000 | MEDICATED_PAD | Freq: Every day | CUTANEOUS | Status: DC
  Administered 2020-05-09 – 2020-05-13 (×3): 6 via TOPICAL

## 2020-05-09 MED ORDER — SODIUM CHLORIDE 0.9% FLUSH
10.0000 mL | INTRAVENOUS | Status: DC | PRN
  Administered 2020-05-09: 10 mL

## 2020-05-09 MED ORDER — SODIUM CHLORIDE 0.9% FLUSH
10.0000 mL | Freq: Two times a day (BID) | INTRAVENOUS | Status: DC
  Administered 2020-05-09 – 2020-05-13 (×7): 10 mL

## 2020-05-09 MED ORDER — SODIUM CHLORIDE 0.9 % IV SOLN
INTRAVENOUS | Status: DC | PRN
  Administered 2020-05-09: 250 mL via INTRAVENOUS
  Administered 2020-05-10: 500 mL via INTRAVENOUS

## 2020-05-09 MED ORDER — MILRINONE LACTATE IN DEXTROSE 20-5 MG/100ML-% IV SOLN
0.1250 ug/kg/min | INTRAVENOUS | Status: DC
  Administered 2020-05-09 – 2020-05-11 (×6): 0.25 ug/kg/min via INTRAVENOUS
  Administered 2020-05-11: 0.125 ug/kg/min via INTRAVENOUS
  Filled 2020-05-09 (×6): qty 100

## 2020-05-09 NOTE — Progress Notes (Signed)
Attending:  I have seen and examined the patient independently and discussed his problems and management with Joneen Roach.  Subjective: Steven Burnett is drowsy today, slurring his words, intermittently awake, alert  Objective: Vitals:   05/09/20 0804 05/09/20 0806 05/09/20 0900 05/09/20 1000  BP:   (!) 128/98 (!) 130/102  Pulse:    (!) 112  Resp:    (!) 29  Temp: (!) 97.5 F (36.4 C) 98.4 F (36.9 C) 98.3 F (36.8 C)   TempSrc: Oral Oral    SpO2:    95%  Weight:      Height:          Intake/Output Summary (Last 24 hours) at 05/09/2020 1154 Last data filed at 05/09/2020 1001 Gross per 24 hour  Intake 0 ml  Output 677 ml  Net -677 ml    General:  Resting comfortably in bed HENT: NCAT OP clear PULM: Crackles bases, normal effort CV: RRR, no mgr GI: BS+, soft, nontender MSK: normal bulk and tone Neuro: drowsy but able to wake and follow commands, MAEW    CBC    Component Value Date/Time   WBC 7.2 05/09/2020 0449   RBC 4.51 05/09/2020 0449   HGB 13.6 05/09/2020 0449   HCT 42.0 05/09/2020 0449   PLT 231 05/09/2020 0449   MCV 93.1 05/09/2020 0449   MCH 30.2 05/09/2020 0449   MCHC 32.4 05/09/2020 0449   RDW 13.2 05/09/2020 0449   LYMPHSABS 0.9 05/04/2020 1923   MONOABS 0.5 05/04/2020 1923   EOSABS 0.1 05/04/2020 1923   BASOSABS 0.0 05/04/2020 1923    BMET    Component Value Date/Time   NA 139 05/09/2020 0449   K 4.2 05/09/2020 0449   CL 105 05/09/2020 0449   CO2 20 (L) 05/09/2020 0449   GLUCOSE 122 (H) 05/09/2020 0449   BUN 31 (H) 05/09/2020 0449   CREATININE 1.49 (H) 05/09/2020 0449   CALCIUM 8.6 (L) 05/09/2020 0449   GFRNONAA 51 (L) 05/09/2020 0449   Echo 10/19 Echo> LVEF < 20%, RV size/function moderately reduced as well RUQ ultrasound 10/19 > Diffusely echogenic liver consistent with hepatic steatosis and or hepatocellular disease  Impression/Plan: Acute systolic heart failure> this is a good unifying diagnosis for his problems, now in cardiogenic  shock. > admit to ICU > place CVL > measure CVP  > check COOX > milrinone per cardiology > will need heart cath  AKI> likely due to cardiogenic shock > as above > Monitor BMET and UOP > Replace electrolytes as needed  Acute respiratory failure with hypoxemia> CAP and Acute pulmonary edema > antibiotics 5 days > +/- lasix depending on CVP/coox  OSA/obesity hypoventilation syndrome > BIPAP qHS  Acute congestive hepatopathy Fatty liver > treat heart failure > monitor LFT's prn  Rest as per NP note from Joneen Roach ACNP  My cc time 40 minutes  Steven [place] Rose, MD [place] [facility] Pager: [phone] Cell: [phone] After 3pm or if no response, call [phone]

## 2020-05-09 NOTE — Progress Notes (Addendum)
Progress Note  Patient Name: Steven Burnett Date of Encounter: 05/09/2020  [facility] Cardiologist: New  Subjective   Patient had poor urine output. Legs cold on exam. BP okay. Remains in aflutter with elevated rates. Denies chest pain. SOB on exam on supplement O2.   Inpatient Medications    Scheduled Meds: . apixaban  5 mg Oral BID  . clopidogrel  75 mg Oral Daily  . fenofibrate  160 mg Oral Daily  . furosemide  40 mg Intravenous BID  . guaiFENesin  1,200 mg Oral BID  . insulin aspart  0-15 Units Subcutaneous TID WC  . insulin aspart  0-5 Units Subcutaneous QHS  . lipase/protease/amylase  24,000 Units Oral TID AC  . metoprolol tartrate  50 mg Oral TID  . pantoprazole  40 mg Oral Daily  . sertraline  100 mg Oral QHS  . sertraline  100 mg Oral Daily  . silver sulfADIAZINE   Topical Daily   Continuous Infusions: . amiodarone 30 mg/hr (05/08/20 2302)  . azithromycin 500 mg (05/08/20 1531)  . cefTRIAXone (ROCEPHIN)  IV 2 g (05/08/20 1454)   PRN Meds: ALPRAZolam, ALPRAZolam, loperamide HCl, ondansetron (ZOFRAN) IV   Vital Signs    Vitals:   05/09/20 0505 05/09/20 0621 05/09/20 0804 05/09/20 0806  BP: 132/82 (!) 140/98    Pulse: (!) 113 (!) 116    Resp: 20 20    Temp: (!) 97.5 F (36.4 C) 97.7 F (36.5 C) (!) 97.5 F (36.4 C) 98.4 F (36.9 C)  TempSrc: Oral Oral Oral Oral  SpO2: 99% 100%    Weight: 129.5 kg     Height:        Intake/Output Summary (Last 24 hours) at 05/09/2020 1104 Last data filed at 05/09/2020 1001 Gross per 24 hour  Intake 0 ml  Output 677 ml  Net -677 ml   Last 3 Weights 05/09/2020 05/04/2020  Weight (lbs) 285 lb 9.6 oz 296 lb  Weight (kg) 129.547 kg 134.265 kg      Telemetry    Aflutter, HR 110-130, PVCs - Personally Reviewed  ECG    No new - Personally Reviewed  Physical Exam   GEN: No acute distress.   Neck: difficult to assess JVD Cardiac: Reg Irreg, tachy, no murmurs, rubs, or gallops.  Respiratory: decreased at  bases; 6-7L O2; weak Lower extremity pulses GI: Soft, nontender, non-distended  MS: No edema; No deformity. Neuro:  Nonfocal  Psych: Normal affect   Labs    High Sensitivity Troponin:   Recent Labs  Lab 05/04/20 2202 05/05/20 0042 05/05/20 0548 05/05/20 0829 05/05/20 1407  TROPONINIHS 145* 149* 121* 133* 115*      Chemistry Recent Labs  Lab 05/06/20 0458 05/06/20 0458 05/07/20 1113 05/08/20 0209 05/09/20 0449  NA 139   < > 139 139 139  K 3.4*   < > 3.6 3.4* 4.2  CL 101   < > 104 102 105  CO2 19*   < > 22 22 20*  GLUCOSE 164*   < > 128* 122* 122*  BUN 15   < > 26* 27* 31*  CREATININE 1.81*   < > 1.66* 1.58* 1.49*  CALCIUM 8.6*   < > 8.6* 8.6* 8.6*  PROT 6.9  --   --  6.7 6.8  ALBUMIN 3.6  --   --  3.3* 3.2*  AST 462*  --   --  1,323* 572*  ALT 261*  --   --  712* 556*  ALKPHOS 29*  --   --  26* 29*  BILITOT 1.7*  --   --  1.3* 0.8  GFRNONAA 40*   < > 45* 48* 51*  ANIONGAP 19*   < > 13 15 14    < > = values in this interval not displayed.     Hematology Recent Labs  Lab 05/07/20 0500 05/08/20 0209 05/09/20 0449  WBC 8.3 7.3 7.2  RBC 4.33 4.37 4.51  HGB 13.1 13.1 13.6  HCT 39.4 40.3 42.0  MCV 91.0 92.2 93.1  MCH 30.3 30.0 30.2  MCHC 33.2 32.5 32.4  RDW 13.2 13.3 13.2  PLT 227 225 231    BNP Recent Labs  Lab 05/07/20 1113  BNP 490.8*     DDimer  Recent Labs  Lab 05/04/20 1923 05/06/20 0458  DDIMER 0.40 1.30*     Radiology    CT ABDOMEN PELVIS WO CONTRAST  Result Date: 05/07/2020 CLINICAL DATA:  59 year old male with abdominal pain. Concern for perforation or peritonitis. EXAM: CT ABDOMEN AND PELVIS WITHOUT CONTRAST TECHNIQUE: Multidetector CT imaging of the abdomen and pelvis was performed following the standard protocol without IV contrast. COMPARISON:  None. FINDINGS: Evaluation of this exam is limited in the absence of intravenous contrast. Lower chest: Three-vessel coronary vascular calcification noted. There is diffuse interstitial  prominence and hazy airspace opacity in the visualized lung bases which may represent edema or pneumonia. Clinical correlation is recommended. No intra-abdominal free air or free fluid. Hepatobiliary: Diffuse fatty liver. No intrahepatic biliary dilatation. The gallbladder is unremarkable. Pancreas: Unremarkable. No pancreatic ductal dilatation or surrounding inflammatory changes. Spleen: Normal in size without focal abnormality. Adrenals/Urinary Tract: The adrenal glands unremarkable. There is a punctate nonobstructing left renal interpolar calculus. No hydronephrosis. The right kidney is unremarkable. The visualized ureters and urinary bladder appear unremarkable. Stomach/Bowel: There is sigmoid diverticulosis without active inflammatory changes. There is no bowel obstruction or active inflammation. The appendix is normal. Vascular/Lymphatic: Mild aortoiliac atherosclerotic disease. The IVC is unremarkable. No portal venous gas. There is no adenopathy. Reproductive: The prostate and seminal vesicles are grossly unremarkable. Other: Midline vertical anterior abdominal wall incisional scar. Musculoskeletal: No acute or significant osseous findings. Degenerative changes. IMPRESSION: 1. Bilateral lower lung field pulmonary opacities may represent edema or pneumonia. Clinical correlation is recommended. 2. No acute intra-abdominal or pelvic pathology. No bowel obstruction. Normal appendix. 3. Fatty liver. 4. Punctate nonobstructing left renal interpolar calculus. No hydronephrosis. 5. Aortic Atherosclerosis (ICD10-I70.0). Electronically Signed   By: 41 M.D.   On: 05/07/2020 22:45   DG CHEST PORT 1 VIEW  Result Date: 05/09/2020 CLINICAL DATA:  Community acquired pneumonia EXAM: PORTABLE CHEST 1 VIEW COMPARISON:  Two days ago FINDINGS: Diffuse interstitial prominence that appears increased. There is asymmetric hazy right chest density correlating with airspace disease on chest CT from 4 days ago.  Cardiopericardial enlargement. No visible effusion or air leak IMPRESSION: 1. Mildly improved airspace disease. 2. Increased interstitial prominence which may be interval vascular congestion. Electronically Signed   By: 05/11/2020 M.D.   On: 05/09/2020 05:53   ECHOCARDIOGRAM COMPLETE  Result Date: 05/08/2020    ECHOCARDIOGRAM REPORT   Patient Name:   Steven Burnett Date of Exam: 05/08/2020 Medical Rec #:  05/10/2020     Height:       68.0 in Accession #:    [id number]    Weight:       296.0 lb Date of Birth:  May 09, 1961      BSA:  2.414 m Patient Age:    59 years      BP:           132/107 mmHg Patient Gender: M             HR:           90 bpm. Exam Location:  Inpatient Procedure: 2D Echo, Cardiac Doppler, Color Doppler and Intracardiac            Opacification Agent Indications:    I48.0 Paroxysmal atrial fibrillation  History:        Patient has no prior history of Echocardiogram examinations.                 Previous Myocardial Infarction; Risk Factors:Hypertension,                 Diabetes and Dyslipidemia.  Sonographer:    Elmarie Shiley Dance Referring Phys: [id number] F GROCE  Sonographer Comments: No subcostal window. IMPRESSIONS  1. Left ventricular ejection fraction, by estimation, is <20%. The left ventricle has severely decreased function. The left ventricle demonstrates global hypokinesis. Left ventricular diastolic parameters are indeterminate.  2. Right ventricular systolic function is moderately reduced. The right ventricular size is moderately enlarged.  3. Left atrial size was moderately dilated.  4. Right atrial size was mildly dilated.  5. The mitral valve is grossly normal. Trivial mitral valve regurgitation.  6. The aortic valve was not well visualized. Aortic valve regurgitation is not visualized. Comparison(s): No prior Echocardiogram. Conclusion(s)/Recommendation(s): Biventricular Failure. Reaching out to cardiology consultation service. FINDINGS  Left Ventricle: Left ventricular  ejection fraction, by estimation, is <20%. The left ventricle has severely decreased function. The left ventricle demonstrates global hypokinesis. Definity contrast agent was given IV to delineate the left ventricular endocardial borders. The left ventricular internal cavity size was normal in size. There is no left ventricular hypertrophy. Left ventricular diastolic parameters are indeterminate. Right Ventricle: The right ventricular size is moderately enlarged. Right vetricular wall thickness was not well visualized. Right ventricular systolic function is moderately reduced. Left Atrium: Left atrial size was moderately dilated. Right Atrium: Right atrial size was mildly dilated. Pericardium: There is no evidence of pericardial effusion. Mitral Valve: The mitral valve is grossly normal. Trivial mitral valve regurgitation. Tricuspid Valve: The tricuspid valve is not well visualized. Tricuspid valve regurgitation is not demonstrated. Aortic Valve: The aortic valve was not well visualized. Aortic valve regurgitation is not visualized. Pulmonic Valve: The pulmonic valve was not well visualized. Pulmonic valve regurgitation is not visualized. Aorta: The aortic root is normal in size and structure. Venous: The pulmonary veins were not well visualized. The inferior vena cava was not well visualized. IAS/Shunts: The interatrial septum was not well visualized.  LEFT VENTRICLE PLAX 2D LVIDd:         5.12 cm LVIDs:         4.08 cm LV PW:         1.15 cm LV IVS:        1.09 cm LVOT diam:     2.00 cm LV SV:         24 LV SV Index:   10 LVOT Area:     3.14 cm  RIGHT VENTRICLE RV Basal diam:  3.32 cm RV S prime:     5.87 cm/s TAPSE (M-mode): 1.4 cm LEFT ATRIUM             Index       RIGHT ATRIUM  Index LA diam:        4.20 cm 1.74 cm/m  RA Area:     19.70 cm LA Vol (A2C):   86.8 ml 35.95 ml/m RA Volume:   56.70 ml  23.49 ml/m LA Vol (A4C):   92.7 ml 38.40 ml/m LA Biplane Vol: 89.4 ml 37.03 ml/m  AORTIC VALVE LVOT  Vmax:   56.40 cm/s LVOT Vmean:  34.850 cm/s LVOT VTI:    0.076 m  AORTA Ao Root diam: 3.30 cm Ao Asc diam:  3.60 cm MITRAL VALVE MV Area (PHT): 3.65 cm     SHUNTS MV Decel Time: 208 msec     Systemic VTI:  0.08 m MV E velocity: 108.00 cm/s  Systemic Diam: 2.00 cm Riley Lam MD Electronically signed by Riley Lam MD Signature Date/Time: 05/08/2020/3:18:13 PM    Final    US Abdomen Limited RUQ (LIVER/GB)  Result Date: 05/08/2020 CLINICAL DATA:  Elevated LFT EXAM: ULTRASOUND ABDOMEN LIMITED RIGHT UPPER QUADRANT COMPARISON:  CT 05/07/2020 FINDINGS: Gallbladder: No gallstones or wall thickening visualized. No sonographic Murphy sign noted by sonographer. Common bile duct: Diameter: Unable to visualize due to bowel gas and habitus. Liver: Diffusely echogenic. Left lobe not well evaluated. No definite focal hepatic abnormality. Portal vein is patent on color Doppler imaging with normal direction of blood flow towards the liver. Other: None. IMPRESSION: 1. Diffusely echogenic liver consistent with hepatic steatosis and or hepatocellular disease. 2. Unable to visualize common bile duct due to habitus and gas. Electronically Signed   By: Jasmine Pang M.D.   On: 05/08/2020 16:40    Cardiac Studies   Echo 05/08/20  1. Left ventricular ejection fraction, by estimation, is <20%. The left  ventricle has severely decreased function. The left ventricle demonstrates  global hypokinesis. Left ventricular diastolic parameters are  indeterminate.  2. Right ventricular systolic function is moderately reduced. The right  ventricular size is moderately enlarged.  3. Left atrial size was moderately dilated.  4. Right atrial size was mildly dilated.  5. The mitral valve is grossly normal. Trivial mitral valve  regurgitation.  6. The aortic valve was not well visualized. Aortic valve regurgitation  is not visualized.   Comparison(s): No prior Echocardiogram.    Conclusion(s)/Recommendation(s): Biventricular Failure. Reaching out to  cardiology consultation service.   Patient Profile     59 y.o. male  with a hx of CAD with prior MI s/p stenting in 2014 (records not available), HTN, HLD, DM, remote pancreatitis with pancreatic insufficiency, morbid obesity, OSA compliant with CPAP, remote DVT provoked by knee surgery, former tobacco/ETOH use (quit 2014) who is being seen today for the evaluation of atrial flutter and severe heart failure  Assessment & Plan    Acute hypoxic respiratory failure - multifactorial in the setting of heart failure, aflutter RVR and CPAP use - Follow O2 demands  Acute biventricular CHF - Echo showed EF<20%, global hypokinesis, moderately reduced RV function, moderately dilated LA and mildly dilated RA, trivial MR - possible tachy-mediated in the setting of aflutter RVR however might need ischemic work-up - Diltiazem discontinued and pt started on Metoprolol 25mg  TID>>might need to hold in the setting of ?low output CHF - IV lasix 40mg  BID - patient had poor urine output, put out overnight - weight not likely inaccurate weight 296>285lbs - creatinine improving with diuresis - Lower legs cold on exam. With poor urine output will order PICC and get COOX. MD notified. Wills tart milrinone at 0.25. Will ask Advanced heart  failure to see.   Rapid aflutter - Unknown duration of aflutter  - CHADSVASC = 6. Started on Eliquis 5mg BID - Dilt switched to metoprolol - IV amiodarone - Rates elevated to 110-130s - Might require TEE/DCCV vs ablation  AKI - 1.58>1.49 - follow creatinine with diuresis  Elevated troponin with known CAD s/p MI/PCI 2014 - HS troponin peaked at 149. Suspect demand ischemia in the setting of CHF and aflutter - Patient denies chest pain - Plavix in place of aspirin. Also on DOAC  For questions or updates, please contact [facility] Please consult [email or web address] for contact info under         Signed, Cadence , PA-C  05/09/2020, 11:04 AM    I have seen and examined the patient along with Cadence 05/11/2020, PA-C .  I have reviewed the chart, notes and new data.  I agree with PA/NP's note.  Key new complaints: feels weaker. Was groggy earlier. Dyspneic, on high flow O2. No angina Key examination changes: remains tachycardic, AFlutter with variable block 100-110s; S3 present. Little edema. Cannot see JVP. Poor UO Key new findings / data: Creat unchanged 1.5, K 4.2 after supplementation. LTFs improved.  PLAN: Some signs of improvement, but remains in borderline cardiogenic shock and in pulmonary edema, biventricular failure. BP does allow some room for vasodilators, but I think he will need inotropes to turn around. Milrinone preferred over dobutamine with tachyarrhythmia.  Add digoxin to [facility] for better rate control. May need to retract beta blocker if he does not improve.  Increase diuretic dose.  Moving to ICU for invasive hemodynamic monitoring.  [facility], MD, [facility] [phone] 05/09/2020, 12:14 PM

## 2020-05-09 NOTE — Progress Notes (Signed)
[facility]  I updated his wife by phone  Heber Bairoil, MD [facility] Pager: [phone] Cell: [phone] If no response, call [phone]

## 2020-05-09 NOTE — Progress Notes (Signed)
Pt HR elevated. MD notified.

## 2020-05-09 NOTE — Progress Notes (Signed)
ANTICOAGULATION CONSULT NOTE - Follow Up Consult  Pharmacy Consult for Heparin> Apixaban > Heparin Indication: aflutter  No Known Allergies  Patient Measurements: Height: 5\' 8"  (172.7 cm) Weight: 129.5 kg (285 lb 9.6 oz) IBW/kg (Calculated) : 68.4 Heparin Dosing Weight:    Vital Signs: Temp: 98.6 F (37 C) (10/20 1200) Temp Source: Oral (10/20 1200) BP: 130/73 (10/20 1400) Pulse Rate: 114 (10/20 1400)  Labs: Recent Labs    05/06/20 2200 05/07/20 0500 05/07/20 0500 05/07/20 1113 05/08/20 0209 05/09/20 0449  HGB  --  13.1   < >  --  13.1 13.6  HCT  --  39.4  --   --  40.3 42.0  PLT  --  227  --   --  225 231  HEPARINUNFRC 0.49 0.45  --   --  0.41  --   CREATININE  --   --   --  1.66* 1.58* 1.49*   < > = values in this interval not displayed.    Estimated Creatinine Clearance: 70.9 mL/min (A) (by C-G formula based on SCr of 1.49 mg/dL (H)).   Assessment: 59yom admitted with SOB and new Aflutter.  Started on heparin drip for anticoagulation.  10/19 heparin > apixaban.  10/20 patient continued to be SOB increase confusion> CL placed coox 50 stop beta blocker >start  milrinone and increase  furosemide 80mg  IV bid.   Plan for cath 10/21 > stop apixaban and resume heparin until procedures complete.   Will check aptt to dose heparin as apixaban falsely elevated HL   Goal of Therapy:  Heparin level 0.3-0.7 units/ml  aptt 66-103 Monitor platelets by anticoagulation protocol: Yes   Plan:  Stop apixabn  At 2200 resume heparin drip rate 2100 uts/hr Daily aptt/HL, CBC Monitor s/s bleeding  Pharm.D. CPP, BCPS Clinical Pharmacist [phone] 05/09/2020 2:36 PM

## 2020-05-09 NOTE — Procedures (Signed)
Central Venous Catheter Insertion Procedure Note  Steven Burnett  [id number]  [birth year]  Date:05/09/20  Time:1:30 PM   Provider Performing:Quintus Premo Lacretia Nicks Mikey Bussing   Procedure: Insertion of Non-tunneled Central Venous [phone]) with US guidance ([id number])   Indication(s) Medication administration  Consent Risks of the procedure as well as the alternatives and risks of each were explained to the patient and/or caregiver.  Consent for the procedure was obtained and is signed in the bedside chart  Anesthesia Topical only with 1% lidocaine   Timeout Verified patient identification, verified procedure, site/side was marked, verified correct patient position, special equipment/implants available, medications/allergies/relevant history reviewed, required imaging and test results available.  Sterile Technique Maximal sterile technique including full sterile barrier drape, hand hygiene, sterile gown, sterile gloves, mask, hair covering, sterile ultrasound probe cover (if used).  Procedure Description Area of catheter insertion was cleaned with chlorhexidine and draped in sterile fashion.  With real-time ultrasound guidance a central venous catheter was placed into the right internal jugular vein. Nonpulsatile blood flow and easy flushing noted in all ports.  The catheter was sutured in place and sterile dressing applied.  Complications/Tolerance None; patient tolerated the procedure well. Chest X-ray is ordered to verify placement for internal jugular or subclavian cannulation.   Chest x-ray is not ordered for femoral cannulation.  EBL Minimal  Specimen(s) None   Steven Burnett, AGACNP-BC [place] [facility]  See Amion for personal pager PCCM on call pager [phone]  05/09/2020 1:31 PM
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
## 2020-05-09 NOTE — Plan of Care (Signed)
?  Problem: Clinical Measurements: ?Goal: Will remain free from infection ?Outcome: Progressing ?  ?

## 2020-05-09 NOTE — Progress Notes (Signed)
SLP Cancellation Note  Patient Details Name: Steven Burnett MRN: [id number] DOB: 1960-12-26   Cancelled treatment:       Reason Eval/Treat Not Completed: Other (comment) Per discussion with Dr. Kendrick Fries on previous date (and his note in chart), SLP evaluation not indicated and to be discontinued. Orders still active, so SLP will d/c at this time. Per MD, pt's O2 needs and N/V will need to improve before ready to resume a PO diet, and he does not anticipate that a swallowing evaluation will be indicated at that time as his concern for aspiration is more so during these episodes of vomiting. Once pt is ready to begin a PO diet, please reorder SLP if there are any other concerns.      Mahala Menghini., M.A. CCC-SLP Acute Rehabilitation Services Pager [phone] Office [phone]  05/09/2020, 8:58 AM

## 2020-05-09 NOTE — Consult Note (Addendum)
Advanced Heart Failure Team Consult Note   Primary Physician: Toma Deiters, MD PCP-Cardiologist:  No primary care provider on file.  Reason for Consultation: Acute Biventricular Heart Failure  HPI:    Steven Burnett is seen today for evaluation of acute biventricular heart failure w/ concern for low output, at the request of Dr. Royann Shivers, Cardiology.   59 y/o obese male, former smoker, with h/o CAD w/ remote MI and coronary stenting (done at outside facility, details unknown), OSA w/ reported compliance w/ CPAP, h/o provoked DVT following knee surgery, T2DM, HTN and HLD.   Admitted for acute hypoxic respiratory failure, felt multifactorial, 2/2 CAP, acute CHF and atrial flutter w/ RVR (duration unknown). Admit CXR showed findings c/w atypical vs viral PNA. COVID and Flu negative. Step pneumon negative, Legionella pending. PCT <0.10. Blood cx NGTD. Currently on Azithromycin + Rocephin.   Aflutter w/ RVR in the 140s on arrival. Continues in AFL today, on amio gtt at 60 mg/hr.   BNP 490 on admit. 2D echo shows severe biventricular failure, LVEF <20% w/ global hypokinesis. No LVH. RV moderately enlarged and systolic function moderately reduced. Started on IV Lasix 40 mg bid w/ poor UOP. SCr 1.33 on admit and increased w/ attempts at diuresis. SCr peaked at 1.81 but now trending down, 1.49 today. His baseline is unknown. He is now oliguric. Volume status assessment difficult given body habitus.   He is a poor history but he says he feels worse today. "Feels lousy". No respiratory difficulty during encounter. BP has been moderately elevated.     Echo 05/08/20 Left ventricular ejection fraction, by estimation, is <20%. The left ventricle has severely decreased function. The left ventricle demonstrates global hypokinesis. Left ventricular diastolic parameters are indeterminate. 2. Right ventricular systolic function is moderately reduced. The right ventricular size is moderately  enlarged. 3. Left atrial size was moderately dilated. 4. Right atrial size was mildly dilated. 5. The mitral valve is grossly normal. Trivial mitral valve regurgitation. 6. The aortic valve was not well visualized. Aortic valve regurgitation is not visualized.  Review of Systems: [y] = yes, [ ]  = no   . General: Weight gain [ ] ; Weight loss [ ] ; Anorexia [ ] ; Fatigue [Y ]; Fever [ ] ; Chills [ ] ; Weakness [Y ]  . Cardiac: Chest pain/pressure [ ] ; Resting SOB [Y ]; Exertional SOB [ Y]; Orthopnea [ ] ; Pedal Edema [ ] ; Palpitations [ ] ; Syncope [ ] ; Presyncope [ ] ; Paroxysmal nocturnal dyspnea[ ]   . Pulmonary: Cough [ ] ; Wheezing[ ] ; Hemoptysis[ ] ; Sputum [ ] ; Snoring [ ]   . GI: Vomiting[ ] ; Dysphagia[ ] ; Melena[ ] ; Hematochezia [ ] ; Heartburn[ ] ; Abdominal pain [ ] ; Constipation [ ] ; Diarrhea [ ] ; BRBPR [ ]   . GU: Hematuria[ ] ; Dysuria [ ] ; Nocturia[ ]   . Vascular: Pain in legs with walking [ ] ; Pain in feet with lying flat [ ] ; Non-healing sores [ ] ; Stroke [ ] ; TIA [ ] ; Slurred speech [ ] ;  . Neuro: Headaches[ ] ; Vertigo[ ] ; Seizures[ ] ; Paresthesias[ ] ;Blurred vision [ ] ; Diplopia [ ] ; Vision changes [ ]   . Ortho/Skin: Arthritis [ ] ; Joint pain [ ] ; Muscle pain [ ] ; Joint swelling [ ] ; Back Pain [ ] ; Rash [ ]   . Psych: Depression[ ] ; Anxiety[ ]   . Heme: Bleeding problems [ ] ; Clotting disorders [ ] ; Anemia [ ]   . Endocrine: Diabetes [ ] ; Thyroid dysfunction[ ]   Home Medications Prior to Admission medications   Medication  Sig Start Date End Date Taking? Authorizing Provider  acetaminophen (TYLENOL) 325 MG tablet Take 325-650 mg by mouth every 6 (six) hours as needed for mild pain or headache.   Yes [provider]  allopurinol (ZYLOPRIM) 300 MG tablet Take 300 mg by mouth 2 (two) times daily.   Yes [provider]  ALPRAZolam Prudy Feeler) 1 MG tablet Take 1 mg by mouth 3 (three) times daily.  04/04/20  Yes [provider]  aspirin EC 81 MG tablet Take 81 mg by mouth in  the morning. Swallow whole.   Yes [provider]  carvedilol (COREG) 6.25 MG tablet Take 6.25 mg by mouth 2 (two) times daily. 04/20/20  Yes [provider]  clopidogrel (PLAVIX) 75 MG tablet Take 75 mg by mouth daily. 04/30/20  Yes [provider]  CREON 24000-76000 units CPEP Take 1 capsule by mouth 3 (three) times daily before meals.  04/22/20  Yes [provider]  fenofibrate (TRICOR) 145 MG tablet Take 145 mg by mouth daily.  03/11/20  Yes [provider]  Flax OIL Take 1 capsule by mouth daily.   Yes [provider]  ibuprofen (ADVIL) 200 MG tablet Take 200-400 mg by mouth every 6 (six) hours as needed for headache or mild pain.   Yes [provider]  losartan (COZAAR) 100 MG tablet Take 100 mg by mouth daily.   Yes [provider]  metFORMIN (GLUCOPHAGE) 500 MG tablet Take 500 mg by mouth daily with breakfast.   Yes [provider]  pantoprazole (PROTONIX) 40 MG tablet Take 40 mg by mouth daily. 04/23/20  Yes [provider]  sertraline (ZOLOFT) 100 MG tablet Take 50 mg by mouth in the morning and at bedtime.  04/12/20  Yes [provider]  silver sulfADIAZINE (SILVADENE) 1 % cream Apply 1 application topically See admin instructions. Apply to affected areas of the right leg 2 times a day- apply a clean dressing afterwards 04/26/20  Yes [provider]  simvastatin (ZOCOR) 40 MG tablet Take 40 mg by mouth at bedtime. 04/28/20  Yes [provider]    Past Medical History: Past Medical History:  Diagnosis Date  . DM2 (diabetes mellitus, type 2) (HCC)   . DVT (deep venous thrombosis) (HCC)    Provoked by knee surgery  . HLD (hyperlipidemia)   . HTN (hypertension)   . MI (myocardial infarction) (HCC) 2014   3 stents  . OSA (obstructive sleep apnea)   . Pancreatitis     Past Surgical History: Past Surgical History:  Procedure Laterality Date  . ABDOMINAL SURGERY    .  CORONARY STENT INTERVENTION    . KNEE SURGERY      Family History: Family History  Problem Relation Age of Onset  . Kidney failure Son        s/p transplant  . Stroke Mother     Social History: Social History   Socioeconomic History  . Marital status: Married    Spouse name: Not on file  . Number of children: Not on file  . Years of education: Not on file  . Highest education level: Not on file  Occupational History  . Not on file  Tobacco Use  . Smoking status: Former Games developer  . Smokeless tobacco: Never Used  . Tobacco comment: Quit in 2014  Substance and Sexual Activity  . Alcohol use: Not Currently    Comment: Former, quit in 2014  . Drug use: Never  . Sexual activity:  Not on file  Other Topics Concern  . Not on file  Social History Narrative  . Not on file   Social Determinants of Health   Financial Resource Strain:   . Difficulty of Paying Living Expenses: Not on file  Food Insecurity:   . Worried About Programme researcher, broadcasting/film/video in the Last Year: Not on file  . Ran Out of Food in the Last Year: Not on file  Transportation Needs:   . Lack of Transportation (Medical): Not on file  . Lack of Transportation (Non-Medical): Not on file  Physical Activity:   . Days of Exercise per Week: Not on file  . Minutes of Exercise per Session: Not on file  Stress:   . Feeling of Stress : Not on file  Social Connections:   . Frequency of Communication with Friends and Family: Not on file  . Frequency of Social Gatherings with Friends and Family: Not on file  . Attends Religious Services: Not on file  . Active Member of Clubs or Organizations: Not on file  . Attends Banker Meetings: Not on file  . Marital Status: Not on file    Allergies:  No Known Allergies  Objective:    Vital Signs:   Temp:  [97.3 F (36.3 C)-99.3 F (37.4 C)] 98.3 F (36.8 C) (10/20 0900) Pulse Rate:  [63-116] 112 (10/20 1000) Resp:  [18-29] 29 (10/20 1000) BP: (127-140)/(82-102)  130/102 (10/20 1000) SpO2:  [95 %-100 %] 95 % (10/20 1000) Weight:  [129.5 kg] 129.5 kg (10/20 0505) Last BM Date: 05/08/20  Weight change: Filed Weights   05/04/20 2013 05/09/20 0505  Weight: 134.3 kg 129.5 kg    Intake/Output:   Intake/Output Summary (Last 24 hours) at 05/09/2020 1149 Last data filed at 05/09/2020 1001 Gross per 24 hour  Intake 0 ml  Output 677 ml  Net -677 ml      Physical Exam    General:  Obese, chronically ill appearing, looks older than actual age. No resp difficulty HEENT: normal Neck: supple. Thick neck, JVD assessment difficult . Carotids 2+ bilat; no bruits. No lymphadenopathy or thyromegaly appreciated. Cor: PMI nondisplaced. Irregular rhythm, tachy rate. No rubs, gallops or murmurs. Lungs: decreased BS bilaterally   Abdomen: obese, mildly distended, nontender. No hepatosplenomegaly. No bruits or masses. Good bowel sounds. Extremities: no cyanosis, clubbing, rash, trace edmea, cool distal extremities  Neuro: alert & orientedx3, cranial nerves grossly intact. moves all 4 extremities w/o difficulty. Affect pleasant   Telemetry   Atrial Flutter 110s   EKG    Atrial flutter w/ RVR 140s   Labs   Basic Metabolic Panel: Recent Labs  Lab 05/05/20 0548 05/05/20 0548 05/06/20 0458 05/06/20 0458 05/07/20 1113 05/08/20 0209 05/09/20 0449  NA 139  --  139  --  139 139 139  K 3.4*  --  3.4*  --  3.6 3.4* 4.2  CL 104  --  101  --  104 102 105  CO2 19*  --  19*  --  22 22 20*  GLUCOSE 140*  --  164*  --  128* 122* 122*  BUN 12  --  15  --  26* 27* 31*  CREATININE 1.33*  --  1.81*  --  1.66* 1.58* 1.49*  CALCIUM 9.1   < > 8.6*   < > 8.6* 8.6* 8.6*  MG  --   --   --   --   --  1.9 2.1   < > =  values in this interval not displayed.    Liver Function Tests: Recent Labs  Lab 05/04/20 1923 05/06/20 0458 05/08/20 0209 05/09/20 0449  AST 37 462* 1,323* 572*  ALT 42 261* 712* 556*  ALKPHOS 30* 29* 26* 29*  BILITOT 1.3* 1.7* 1.3* 0.8   PROT 6.7 6.9 6.7 6.8  ALBUMIN 3.8 3.6 3.3* 3.2*   No results for input(s): LIPASE, AMYLASE in the last 168 hours. Recent Labs  Lab 05/09/20 1033  AMMONIA 32    CBC: Recent Labs  Lab 05/04/20 1923 05/04/20 2008 05/05/20 0548 05/06/20 0458 05/07/20 0500 05/08/20 0209 05/09/20 0449  WBC 9.5   < > 9.3 11.5* 8.3 7.3 7.2  NEUTROABS 7.9*  --   --   --   --   --   --   HGB 14.2   < > 13.9 13.9 13.1 13.1 13.6  HCT 43.7   < > 42.3 42.5 39.4 40.3 42.0  MCV 91.0   < > 92.0 92.0 91.0 92.2 93.1  PLT 224   < > 242 253 227 225 231   < > = values in this interval not displayed.    Cardiac Enzymes: No results for input(s): CKTOTAL, CKMB, CKMBINDEX, TROPONINI in the last 168 hours.  BNP: BNP (last 3 results) Recent Labs    05/07/20 1113  BNP 490.8*    ProBNP (last 3 results) No results for input(s): PROBNP in the last 8760 hours.   CBG: Recent Labs  Lab 05/08/20 0611 05/08/20 1145 05/08/20 1655 05/08/20 2054 05/09/20 0623  GLUCAP 107* 129* 142* 129* 127*    Coagulation Studies: No results for input(s): LABPROT, INR in the last 72 hours.   Imaging   DG CHEST PORT 1 VIEW  Result Date: 05/09/2020 CLINICAL DATA:  Community acquired pneumonia EXAM: PORTABLE CHEST 1 VIEW COMPARISON:  Two days ago FINDINGS: Diffuse interstitial prominence that appears increased. There is asymmetric hazy right chest density correlating with airspace disease on chest CT from 4 days ago. Cardiopericardial enlargement. No visible effusion or air leak IMPRESSION: 1. Mildly improved airspace disease. 2. Increased interstitial prominence which may be interval vascular congestion. Electronically Signed   By: Marnee Spring M.D.   On: 05/09/2020 05:53   ECHOCARDIOGRAM COMPLETE  Result Date: 05/08/2020    ECHOCARDIOGRAM REPORT   Patient Name:   Steven Burnett Date of Exam: 05/08/2020 Medical Rec #:  [id number]     Height:       68.0 in Accession #:    [id number]    Weight:       296.0 lb Date of  Birth:  [birth year]      BSA:          2.414 m Patient Age:    59 years      BP:           132/107 mmHg Patient Gender: M             HR:           90 bpm. Exam Location:  Inpatient Procedure: 2D Echo, Cardiac Doppler, Color Doppler and Intracardiac            Opacification Agent Indications:    I48.0 Paroxysmal atrial fibrillation  History:        Patient has no prior history of Echocardiogram examinations.                 Previous Myocardial Infarction; Risk Factors:Hypertension,  Diabetes and Dyslipidemia.  Sonographer:    Elmarie Shiley Dance Referring Phys: [address] F GROCE  Sonographer Comments: No subcostal window. IMPRESSIONS  1. Left ventricular ejection fraction, by estimation, is <20%. The left ventricle has severely decreased function. The left ventricle demonstrates global hypokinesis. Left ventricular diastolic parameters are indeterminate.  2. Right ventricular systolic function is moderately reduced. The right ventricular size is moderately enlarged.  3. Left atrial size was moderately dilated.  4. Right atrial size was mildly dilated.  5. The mitral valve is grossly normal. Trivial mitral valve regurgitation.  6. The aortic valve was not well visualized. Aortic valve regurgitation is not visualized. Comparison(s): No prior Echocardiogram. Conclusion(s)/Recommendation(s): Biventricular Failure. Reaching out to cardiology consultation service. FINDINGS  Left Ventricle: Left ventricular ejection fraction, by estimation, is <20%. The left ventricle has severely decreased function. The left ventricle demonstrates global hypokinesis. Definity contrast agent was given IV to delineate the left ventricular endocardial borders. The left ventricular internal cavity size was normal in size. There is no left ventricular hypertrophy. Left ventricular diastolic parameters are indeterminate. Right Ventricle: The right ventricular size is moderately enlarged. Right vetricular wall thickness was not well  visualized. Right ventricular systolic function is moderately reduced. Left Atrium: Left atrial size was moderately dilated. Right Atrium: Right atrial size was mildly dilated. Pericardium: There is no evidence of pericardial effusion. Mitral Valve: The mitral valve is grossly normal. Trivial mitral valve regurgitation. Tricuspid Valve: The tricuspid valve is not well visualized. Tricuspid valve regurgitation is not demonstrated. Aortic Valve: The aortic valve was not well visualized. Aortic valve regurgitation is not visualized. Pulmonic Valve: The pulmonic valve was not well visualized. Pulmonic valve regurgitation is not visualized. Aorta: The aortic root is normal in size and structure. Venous: The pulmonary veins were not well visualized. The inferior vena cava was not well visualized. IAS/Shunts: The interatrial septum was not well visualized.  LEFT VENTRICLE PLAX 2D LVIDd:         5.12 cm LVIDs:         4.08 cm LV PW:         1.15 cm LV IVS:        1.09 cm LVOT diam:     2.00 cm LV SV:         24 LV SV Index:   10 LVOT Area:     3.14 cm  RIGHT VENTRICLE RV Basal diam:  3.32 cm RV S prime:     5.87 cm/s TAPSE (M-mode): 1.4 cm LEFT ATRIUM             Index       RIGHT ATRIUM           Index LA diam:        4.20 cm 1.74 cm/m  RA Area:     19.70 cm LA Vol (A2C):   86.8 ml 35.95 ml/m RA Volume:   56.70 ml  23.49 ml/m LA Vol (A4C):   92.7 ml 38.40 ml/m LA Biplane Vol: 89.4 ml 37.03 ml/m  AORTIC VALVE LVOT Vmax:   56.40 cm/s LVOT Vmean:  34.850 cm/s LVOT VTI:    0.076 m  AORTA Ao Root diam: 3.30 cm Ao Asc diam:  3.60 cm MITRAL VALVE MV Area (PHT): 3.65 cm     SHUNTS MV Decel Time: 208 msec     Systemic VTI:  0.08 m MV E velocity: 108.00 cm/s  Systemic Diam: 2.00 cm Riley Lam MD Electronically signed by Riley Lam MD Signature Date/Time:  05/08/2020/3:18:13 PM    Final    US Abdomen Limited RUQ (LIVER/GB)  Result Date: 05/08/2020 CLINICAL DATA:  Elevated LFT EXAM: ULTRASOUND ABDOMEN  LIMITED RIGHT UPPER QUADRANT COMPARISON:  CT 05/07/2020 FINDINGS: Gallbladder: No gallstones or wall thickening visualized. No sonographic Murphy sign noted by sonographer. Common bile duct: Diameter: Unable to visualize due to bowel gas and habitus. Liver: Diffusely echogenic. Left lobe not well evaluated. No definite focal hepatic abnormality. Portal vein is patent on color Doppler imaging with normal direction of blood flow towards the liver. Other: None. IMPRESSION: 1. Diffusely echogenic liver consistent with hepatic steatosis and or hepatocellular disease. 2. Unable to visualize common bile duct due to habitus and gas. Electronically Signed   By: Jasmine Pang M.D.   On: 05/08/2020 16:40      Medications:     Current Medications: . apixaban  5 mg Oral BID  . clopidogrel  75 mg Oral Daily  . fenofibrate  160 mg Oral Daily  . furosemide  40 mg Intravenous BID  . guaiFENesin  1,200 mg Oral BID  . insulin aspart  0-15 Units Subcutaneous TID WC  . insulin aspart  0-5 Units Subcutaneous QHS  . lipase/protease/amylase  24,000 Units Oral TID AC  . metoprolol tartrate  50 mg Oral TID  . pantoprazole  40 mg Oral Daily  . sertraline  100 mg Oral QHS  . sertraline  100 mg Oral Daily  . silver sulfADIAZINE   Topical Daily     Infusions: . amiodarone 30 mg/hr (05/08/20 2302)  . azithromycin 500 mg (05/08/20 1531)  . cefTRIAXone (ROCEPHIN)  IV 2 g (05/08/20 1454)  . milrinone        Assessment/Plan   1. Acute Biventricular Heart Failure - Echo shows severe BiV failure, LVEF < 20%, diffuse hypokinesis, RV moderately reduced. Chronicity of HF unknown, no prior studies for comparison.  - Multiple possible etiologies. Has reported coronary disease, s/p remote MI and coronary stenting at outside facility (details unknown). Will need LHC, pending renal function  - could be tachy mediated from rapid AFL, will need attempt at DCCV prior to d/c - ? Viral CM. Being treated for PNA. COVID and flu  negative  - Agree w/ placement of central line to check Co-ox and CVPs - suspect he may need inotropic support to aid in diuresis  - add low dose digoxin  - hold  blocker for now given concern for low output - Plan R/LHC this admit - cMRI if cath unrevealing  - his RV failure is likely 2/2 LV heart failure but will also need to exclude CTEPH given h/o DVT. V/Q scan once more stable. Suspect OSA also contributing factor. Cont CPAP  - ultimately will place on GDMT. Would wait to start Entresto post LHC    2. Atrial Flutter w/ RVR - chronicity unknown  - has underlying OSA, LA moderately dilated on echo  - need to exclude coronary ischemia - may also be driven by infection (PNA) - continue amio gtt - add digoxin to help w/ rate control + inotropic support  - he will need R/LHC. Would change Eliquis back to IV heparin until all invasive procedures are competed - plan TEE/DCCV prior to d/c if no chemical conversion w/ amio gtt  3. PNA - abx per PCCM  - on Azithromycin + Rocephin.  4. OSA - continue CPAP qhs   5. Elevated LFTs - AST 1,323 >> 572 - ALT 712 >>556 - suspect hepatic congestion/ low  output from CHF/RV failure  - anticipate he will need inotrope's and further diuresis  - continue to follow    6. CAD - reported coronary disease, s/p remote MI and coronary stenting at outside facility (details unknown).  - Hs trop peaked at 145 and down-trending, suspect demand ischemia  - Will need LHC to r/o coronary ischemia in the setting of HF and atrial arrhymia  - continue Plavix. No ASA given need for a/c - no statin w/ elevated LFTs    7. T2DM  - hgb A1c 5.7  - consider future SGLT2i  8. AKI - SCr 1.3 on admit (baseline unknown) - SCr peaked to 1.8, now trending down 1.49 - oliguric  - ? Low output. CCM to place central line. Will check co-ox - may need milrinone - follow BMP    Length of Stay: [address], PA-C  05/09/2020, 11:49 AM  Advanced Heart  Failure Team Pager [phone] (M-F; 7a - 4p)  Please contact [facility] for night-coverage after hours (4p -7a ) and weekends on [email or web address]  Patient seen with PA, agree with the above note.    Patient has remote history of CAD/PCI.  He has OSA on CPAP and has history of provoked DVT after surgery.  He has seen a cardiologist in [place] in the past.  He was admitted with shortness of breath and atrial flutter/RVR.  He had been rate-controlled with amiodarone gtt and was on anticoagulation.  Echo showed EF < 20% with moderate RV dysfunction.  With cool extremities and poor UOP/rising creatinine, he was moved to [facility] for closer monitoring.  CVL placed, co-ox 47% with CVP 15. He has been started on milrinone 0.25 mcg/kg/min.  Given dyspnea, Bipap started and he got Lasix 80 mg IV with good response on milrinone.   General: NAD Neck: JVP 14-16 cm, no thyromegaly or thyroid nodule.  Lungs: Decreased at bases.  CV: Nondisplaced PMI.  Heart tachy, irregular S1/S2, no S3/S4, 2/6 SEM RUSB.  Trace ankle edema.  Abdomen: Soft, nontender, no hepatosplenomegaly, no distention.  Skin: Intact without lesions or rashes.  Neurologic: Alert and oriented x 3.  Psych: Normal affect. Extremities: No clubbing or cyanosis.  HEENT: Normal.   1. Acute systolic CHF/cardiogenic shock: Echo this admission with EF < 20%, moderate RV dysfunction.  Cause uncertain, has history of CAD remotely.  He was in atrial flutter with RVR at admission, not sure how long this was present prior.  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  Milrinone gtt 0.25 started with low co-ox.  CVP 15, good response to IV Lasix after milrinone started.  - Follow co-ox, continue milrinone 0.25.  - Lasix 80 mg IV bid.  - Add digoxin 0.125 daily.  - Will need RHC/LHC, will keep on IV heparin gtt and do this tomorrow.  - Will need TEE-guided DCCV after cath and with better volume control.  2. Atrial flutter with RVR: Not reported in the past.   Admitted in atrial flutter with RVR.  As above, ?tachy-mediated CMP. - Amiodarone gtt 60 mg/hr for rate control for now.  - Continue heparin gtt.  - Once he is more diuresed and has had cath, will plan TEE-guided DCCV (possibly Friday).  - Consider eventual atrial fibrillation ablation.  3. CAD: Remote history of CAD/PCI apparently in [place].  No chest pain.  HS-TnI mildly elevated, suspect demand ischemia with CHF/cardiogenic shock.  Will need eventual coronary evaluation to rule out ischemic cardiomyopathy.  - If creatinine is stable tomorrow, will plan  left/right heart cath.  4. H/o DVT: Prior, provoked by surgery.   5. ?PNA: Covering with ceftriaxone/azithromycin.  6. AKI: Creatinine up to 1.8, down to 1.49 today.  Suspect cardiorenal syndrome.  Hopefully will improve with milrinone and diuresis.  7. Elevated LFTs: Suspect shock liver.  Trending down, follow.  8. OSA, suspect OHS: Currently on Bipap with pulmonary edema/dyspnea.   Marca Ancona 05/09/2020 4:39 PM

## 2020-05-10 ENCOUNTER — Ambulatory Visit (HOSPITAL_COMMUNITY): Admission: RE | Admit: 2020-05-10 | Payer: BC Managed Care – PPO | Source: Home / Self Care | Admitting: Cardiology

## 2020-05-10 ENCOUNTER — Inpatient Hospital Stay (HOSPITAL_COMMUNITY)
Admission: EM | Disposition: A | Discharge status: Home / Self Care | Payer: Self-pay | Source: Home / Self Care | Attending: Internal Medicine

## 2020-05-10 DIAGNOSIS — I4891 Unspecified atrial fibrillation: Secondary | ICD-10-CM

## 2020-05-10 DIAGNOSIS — I429 Cardiomyopathy, unspecified: Secondary | ICD-10-CM | POA: Diagnosis not present

## 2020-05-10 DIAGNOSIS — I5021 Acute systolic (congestive) heart failure: Secondary | ICD-10-CM | POA: Diagnosis not present

## 2020-05-10 DIAGNOSIS — I2510 Atherosclerotic heart disease of native coronary artery without angina pectoris: Secondary | ICD-10-CM | POA: Diagnosis not present

## 2020-05-10 DIAGNOSIS — J9601 Acute respiratory failure with hypoxia: Secondary | ICD-10-CM | POA: Diagnosis not present

## 2020-05-10 DIAGNOSIS — I4892 Unspecified atrial flutter: Secondary | ICD-10-CM | POA: Diagnosis not present

## 2020-05-10 HISTORY — PX: RIGHT/LEFT HEART CATH AND CORONARY ANGIOGRAPHY: CATH118266

## 2020-05-10 LAB — POCT I-STAT EG7
Acid-Base Excess: 3 mmol/L — ABNORMAL HIGH (ref 0.0–2.0)
Acid-Base Excess: 5 mmol/L — ABNORMAL HIGH (ref 0.0–2.0)
Bicarbonate: 30.2 mmol/L — ABNORMAL HIGH (ref 20.0–28.0)
Bicarbonate: 31.3 mmol/L — ABNORMAL HIGH (ref 20.0–28.0)
Calcium, Ion: 1.07 mmol/L — ABNORMAL LOW (ref 1.15–1.40)
Calcium, Ion: 1.18 mmol/L (ref 1.15–1.40)
HCT: 37 % — ABNORMAL LOW (ref 39.0–52.0)
HCT: 58 % — ABNORMAL HIGH (ref 39.0–52.0)
Hemoglobin: 12.6 g/dL — ABNORMAL LOW (ref 13.0–17.0)
Hemoglobin: 19.7 g/dL — ABNORMAL HIGH (ref 13.0–17.0)
O2 Saturation: 69 %
O2 Saturation: 71 %
Potassium: 3.5 mmol/L (ref 3.5–5.1)
Potassium: 3.7 mmol/L (ref 3.5–5.1)
Sodium: 148 mmol/L — ABNORMAL HIGH (ref 135–145)
Sodium: 149 mmol/L — ABNORMAL HIGH (ref 135–145)
TCO2: 32 mmol/L (ref 22–32)
TCO2: 33 mmol/L — ABNORMAL HIGH (ref 22–32)
pCO2, Ven: 54.1 mmHg (ref 44.0–60.0)
pCO2, Ven: 55.4 mmHg (ref 44.0–60.0)
pH, Ven: 7.355 (ref 7.250–7.430)
pH, Ven: 7.36 (ref 7.250–7.430)
pO2, Ven: 39 mmHg (ref 32.0–45.0)
pO2, Ven: 40 mmHg (ref 32.0–45.0)

## 2020-05-10 LAB — CULTURE, BLOOD (ROUTINE X 2): Culture: NO GROWTH

## 2020-05-10 LAB — COMPREHENSIVE METABOLIC PANEL
ALT: 386 U/L — ABNORMAL HIGH (ref 0–44)
AST: 288 U/L — ABNORMAL HIGH (ref 15–41)
Albumin: 2.9 g/dL — ABNORMAL LOW (ref 3.5–5.0)
Alkaline Phosphatase: 28 U/L — ABNORMAL LOW (ref 38–126)
Anion gap: 11 (ref 5–15)
BUN: 27 mg/dL — ABNORMAL HIGH (ref 6–20)
CO2: 28 mmol/L (ref 22–32)
Calcium: 8.4 mg/dL — ABNORMAL LOW (ref 8.9–10.3)
Chloride: 105 mmol/L (ref 98–111)
Creatinine, Ser: 1.34 mg/dL — ABNORMAL HIGH (ref 0.61–1.24)
GFR, Estimated: 58 mL/min — ABNORMAL LOW (ref >60)
Glucose, Bld: 127 mg/dL — ABNORMAL HIGH (ref 70–99)
Potassium: 3.4 mmol/L — ABNORMAL LOW (ref 3.5–5.1)
Sodium: 144 mmol/L (ref 135–145)
Total Bilirubin: 0.7 mg/dL (ref 0.3–1.2)
Total Protein: 6.2 g/dL — ABNORMAL LOW (ref 6.5–8.1)

## 2020-05-10 LAB — HEPARIN LEVEL (UNFRACTIONATED): Heparin Unfractionated: 1.5 IU/mL — ABNORMAL HIGH (ref 0.30–0.70)

## 2020-05-10 LAB — APTT: aPTT: 74 seconds — ABNORMAL HIGH (ref 24–36)

## 2020-05-10 LAB — MAGNESIUM: Magnesium: 2.1 mg/dL (ref 1.7–2.4)

## 2020-05-10 LAB — POTASSIUM: Potassium: 3.6 mmol/L (ref 3.5–5.1)

## 2020-05-10 LAB — GLUCOSE, CAPILLARY
Glucose-Capillary: 109 mg/dL — ABNORMAL HIGH (ref 70–99)
Glucose-Capillary: 111 mg/dL — ABNORMAL HIGH (ref 70–99)
Glucose-Capillary: 108 mg/dL — ABNORMAL HIGH (ref 70–99)
Glucose-Capillary: 106 mg/dL — ABNORMAL HIGH (ref 70–99)

## 2020-05-10 LAB — COOXEMETRY PANEL
Carboxyhemoglobin: 0.9 % (ref 0.5–1.5)
Methemoglobin: 0.8 % (ref 0.0–1.5)
O2 Saturation: 75.4 %
Total hemoglobin: 12.7 g/dL (ref 12.0–16.0)

## 2020-05-10 LAB — LEGIONELLA PNEUMOPHILA SEROGP 1 UR AG: L. pneumophila Serogp 1 Ur Ag: NEGATIVE

## 2020-05-10 SURGERY — RIGHT/LEFT HEART CATH AND CORONARY ANGIOGRAPHY
Anesthesia: LOCAL

## 2020-05-10 MED ORDER — IOHEXOL 350 MG/ML SOLN
INTRAVENOUS | Status: DC | PRN
  Administered 2020-05-10: 70 mL

## 2020-05-10 MED ORDER — SODIUM CHLORIDE 0.9 % IV SOLN: 250.0000 mL | INTRAVENOUS | Status: DC | PRN

## 2020-05-10 MED ORDER — POTASSIUM CHLORIDE CRYS ER 20 MEQ PO TBCR
40.0000 meq | EXTENDED_RELEASE_TABLET | ORAL | Status: AC
  Administered 2020-05-10 (×2): 40 meq via ORAL
  Filled 2020-05-10 (×2): qty 2

## 2020-05-10 MED ORDER — HEPARIN (PORCINE) IN NACL 1000-0.9 UT/500ML-% IV SOLN
INTRAVENOUS | Status: AC
  Filled 2020-05-10: qty 1000

## 2020-05-10 MED ORDER — SPIRONOLACTONE 12.5 MG HALF TABLET
12.5000 mg | ORAL_TABLET | Freq: Every day | ORAL | Status: DC
  Administered 2020-05-10: 12.5 mg via ORAL
  Filled 2020-05-10: qty 1

## 2020-05-10 MED ORDER — CHLORHEXIDINE GLUCONATE 0.12 % MT SOLN
15.0000 mL | Freq: Two times a day (BID) | OROMUCOSAL | Status: DC
  Administered 2020-05-10 – 2020-05-13 (×6): 15 mL via OROMUCOSAL
  Filled 2020-05-10 (×7): qty 15

## 2020-05-10 MED ORDER — IBUPROFEN 200 MG PO TABS: 200.0000 mg | ORAL_TABLET | Freq: Three times a day (TID) | ORAL | Status: DC | PRN

## 2020-05-10 MED ORDER — SODIUM CHLORIDE 0.9 % IV SOLN: INTRAVENOUS | Status: DC

## 2020-05-10 MED ORDER — HEPARIN (PORCINE) IN NACL 1000-0.9 UT/500ML-% IV SOLN
INTRAVENOUS | Status: DC | PRN
  Administered 2020-05-10 (×2): 500 mL

## 2020-05-10 MED ORDER — ORAL CARE MOUTH RINSE
15.0000 mL | Freq: Two times a day (BID) | OROMUCOSAL | Status: DC
  Administered 2020-05-12 – 2020-05-13 (×3): 15 mL via OROMUCOSAL

## 2020-05-10 MED ORDER — ASPIRIN 81 MG PO CHEW: 81.0000 mg | CHEWABLE_TABLET | ORAL | Status: DC

## 2020-05-10 MED ORDER — OXYCODONE HCL 5 MG PO TABS: 5.0000 mg | ORAL_TABLET | Freq: Four times a day (QID) | ORAL | Status: DC | PRN

## 2020-05-10 MED ORDER — FENTANYL CITRATE (PF) 100 MCG/2ML IJ SOLN
INTRAMUSCULAR | Status: DC | PRN
  Administered 2020-05-10: 25 ug via INTRAVENOUS

## 2020-05-10 MED ORDER — SODIUM CHLORIDE 0.9% FLUSH: 3.0000 mL | INTRAVENOUS | Status: DC | PRN

## 2020-05-10 MED ORDER — LIDOCAINE HCL (PF) 1 % IJ SOLN
INTRAMUSCULAR | Status: AC
  Filled 2020-05-10: qty 30

## 2020-05-10 MED ORDER — MIDAZOLAM HCL 2 MG/2ML IJ SOLN
INTRAMUSCULAR | Status: AC
  Filled 2020-05-10: qty 2

## 2020-05-10 MED ORDER — HEPARIN SODIUM (PORCINE) 1000 UNIT/ML IJ SOLN
INTRAMUSCULAR | Status: AC
  Filled 2020-05-10: qty 1

## 2020-05-10 MED ORDER — VERAPAMIL HCL 2.5 MG/ML IV SOLN
INTRAVENOUS | Status: DC | PRN
  Administered 2020-05-10: 10 mL via INTRA_ARTERIAL

## 2020-05-10 MED ORDER — LABETALOL HCL 5 MG/ML IV SOLN: 10.0000 mg | INTRAVENOUS | Status: AC | PRN

## 2020-05-10 MED ORDER — HEPARIN SODIUM (PORCINE) 1000 UNIT/ML IJ SOLN
INTRAMUSCULAR | Status: DC | PRN
  Administered 2020-05-10: 6000 [IU] via INTRAVENOUS

## 2020-05-10 MED ORDER — POTASSIUM CHLORIDE CRYS ER 20 MEQ PO TBCR
40.0000 meq | EXTENDED_RELEASE_TABLET | Freq: Once | ORAL | Status: AC
  Administered 2020-05-10: 40 meq via ORAL
  Filled 2020-05-10: qty 2

## 2020-05-10 MED ORDER — LIDOCAINE HCL (PF) 1 % IJ SOLN
INTRAMUSCULAR | Status: DC | PRN
  Administered 2020-05-10: 2 mL

## 2020-05-10 MED ORDER — LOSARTAN POTASSIUM 25 MG PO TABS
12.5000 mg | ORAL_TABLET | Freq: Every day | ORAL | Status: DC
  Administered 2020-05-10: 12.5 mg via ORAL
  Filled 2020-05-10: qty 1

## 2020-05-10 MED ORDER — ACETAMINOPHEN 325 MG PO TABS
650.0000 mg | ORAL_TABLET | Freq: Four times a day (QID) | ORAL | Status: DC | PRN
  Administered 2020-05-10: 650 mg via ORAL
  Filled 2020-05-10: qty 2

## 2020-05-10 MED ORDER — CHLORHEXIDINE GLUCONATE 0.12 % MT SOLN: 15.0000 mL | Freq: Two times a day (BID) | OROMUCOSAL | Status: DC

## 2020-05-10 MED ORDER — VERAPAMIL HCL 2.5 MG/ML IV SOLN
INTRAVENOUS | Status: AC
  Filled 2020-05-10: qty 2

## 2020-05-10 MED ORDER — MIDAZOLAM HCL 2 MG/2ML IJ SOLN
INTRAMUSCULAR | Status: DC | PRN
  Administered 2020-05-10: 0.5 mg via INTRAVENOUS

## 2020-05-10 MED ORDER — HYDRALAZINE HCL 20 MG/ML IJ SOLN: 10.0000 mg | INTRAMUSCULAR | Status: AC | PRN

## 2020-05-10 MED ORDER — ONDANSETRON HCL 4 MG/2ML IJ SOLN: 4.0000 mg | Freq: Four times a day (QID) | INTRAMUSCULAR | Status: DC | PRN

## 2020-05-10 MED ORDER — APIXABAN 5 MG PO TABS
5.0000 mg | ORAL_TABLET | Freq: Two times a day (BID) | ORAL | Status: DC
  Administered 2020-05-10 – 2020-05-13 (×6): 5 mg via ORAL
  Filled 2020-05-10 (×6): qty 1

## 2020-05-10 MED ORDER — ASPIRIN 81 MG PO CHEW
81.0000 mg | CHEWABLE_TABLET | ORAL | Status: AC
  Administered 2020-05-10: 81 mg via ORAL
  Filled 2020-05-10: qty 1

## 2020-05-10 MED ORDER — ACETAMINOPHEN 325 MG PO TABS
650.0000 mg | ORAL_TABLET | ORAL | Status: DC | PRN
  Administered 2020-05-11 – 2020-05-13 (×3): 650 mg via ORAL
  Filled 2020-05-10 (×3): qty 2

## 2020-05-10 MED ORDER — SODIUM CHLORIDE 0.9% FLUSH
3.0000 mL | Freq: Two times a day (BID) | INTRAVENOUS | Status: DC
  Administered 2020-05-10 – 2020-05-13 (×5): 3 mL via INTRAVENOUS

## 2020-05-10 MED ORDER — FENTANYL CITRATE (PF) 100 MCG/2ML IJ SOLN
INTRAMUSCULAR | Status: AC
  Filled 2020-05-10: qty 2

## 2020-05-10 SURGICAL SUPPLY — 12 items
CATH 5FR JL3.5 JR4 ANG PIG MP (CATHETERS) ×2 IMPLANT
CATH BALLN WEDGE 5F 110CM (CATHETERS) ×2 IMPLANT
CATH INFINITI 5 FR 3DRC (CATHETERS) ×2 IMPLANT
DEVICE RAD COMP TR BAND LRG (VASCULAR PRODUCTS) ×2 IMPLANT
GLIDESHEATH SLEND SS 6F .021 (SHEATH) ×2 IMPLANT
GUIDEWIRE INQWIRE 1.5J.035X260 (WIRE) ×1 IMPLANT
HOVERMATT SINGLE USE (MISCELLANEOUS) ×2 IMPLANT
INQWIRE 1.5J .035X260CM (WIRE) ×2
KIT HEART LEFT (KITS) ×2 IMPLANT
PACK CARDIAC CATHETERIZATION (CUSTOM PROCEDURE TRAY) ×2 IMPLANT
SHEATH GLIDE SLENDER 4/5FR (SHEATH) ×2 IMPLANT
TRANSDUCER W/STOPCOCK (MISCELLANEOUS) ×2 IMPLANT

## 2020-05-10 NOTE — Discharge Instructions (Signed)

## 2020-05-10 NOTE — Progress Notes (Signed)
Transitions of Care Pharmacist Note  Steven Burnett is a 59 y.o. male that has been diagnosed with A Fib and will be prescribed Eliquis (apixaban) at discharge.   Patient Education: I provided the following education on apixaban to the patient: How to take the medication Described what the medication is Signs of bleeding Signs/symptoms of VTE and stroke  Answered their questions  Discharge Medications Plan: The patient is uncertain at this time if they want to use the [facility] pharmacy     Thank you,   Lamar Sprinkles, PharmD PGY1 Pharmacy Resident 05/10/2020 6:01 PM  May 10, 2020

## 2020-05-10 NOTE — Progress Notes (Signed)
eLink called in relation to the patient complaining of right sided neck/jaw pain. Pt had been wearning bipap mask. Area not noted to be warm, firm, or swollen in the area of pain. Patient given ice pack to place on area while awaiting new orders.   In addition patient complains that he has not been made aware of plan of care. It was explained that he was lethargic and confused prior to the placement of bipap.

## 2020-05-10 NOTE — Progress Notes (Signed)
eLink Physician-Brief Progress Note Patient Name: Steven Burnett DOB: [birth year] MRN: [id number]   Date of Service  05/10/2020  HPI/Events of Note  Patient c/o pain at new CVL site. AST and ALT markedly elevated, therefore, can't use Tylenol. Creatinine = 1.49.   eICU Interventions  Plan: 1. Motrin 200 mg PO Q 8 hours PRN pain.      Intervention Category Major Interventions: Other:  Ariadne Rissmiller Dennard Nip 05/10/2020, 4:03 AM
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
## 2020-05-10 NOTE — Progress Notes (Signed)
Patient transported to cath lab on BIPAP no complications noted.

## 2020-05-10 NOTE — Plan of Care (Signed)

## 2020-05-10 NOTE — H&P (View-Only) (Signed)
Advanced Heart Failure Rounding Note  PCP-Cardiologist: No primary care provider on file.    Patient Profile   59 y/o obese male, former smoker, with h/o CAD w/ remote MI and coronary stenting (done at outside facility, details unknown), OSA w/ reported compliance w/ CPAP, h/o provoked DVT following knee surgery, T2DM, HTN and HLD, admitted for acute hypoxic respiratory failure 2/2 acute CHF>>cardiogenic shock, AFL w/ RVR and ? PNA.    Subjective:    Transferred to ICU yesterday. Central line placed. Initial co-ox 47%. CVP 15. Started on milrinone 0.25 + IV Lasix.   Co-ox improved, 75% today. UOP increased, -4.2L out yesterday.  Wt down 10 lb. CVP 12   Converted to NSR overnight. HR 80s, w/ PVCs.   Scr trending down, 1.6>>1.5>>1.3.  K 3.4   Much more alert today. Feels better. No chest pain. No active dyspnea.   On for [facility] today.    Objective:   Weight Range: 124.9 kg Body mass index is 41.87 kg/m.   Vital Signs:   Temp:  [97.5 F (36.4 C)-98.6 F (37 C)] 97.9 F (36.6 C) (10/21 0650) Pulse Rate:  [31-119] 85 (10/21 0700) Resp:  [16-33] 22 (10/21 0700) BP: (94-148)/(49-112) 145/101 (10/21 0700) SpO2:  [81 %-100 %] 99 % (10/21 0700) Weight:  [124.9 kg] 124.9 kg (10/21 0610) Last BM Date: 05/08/20  Weight change: Filed Weights   05/04/20 2013 05/09/20 0505 05/10/20 0610  Weight: 134.3 kg 129.5 kg 124.9 kg    Intake/Output:   Intake/Output Summary (Last 24 hours) at 05/10/2020 0736 Last data filed at 05/10/2020 0700 Gross per 24 hour  Intake 1926.93 ml  Output 4175 ml  Net -2248.07 ml      Physical Exam    CVP 12 General:  Well appearing, obese male. No resp difficulty HEENT: Normal Neck: Supple. Thick neck, JVP ~12 cm . + Rt IJ CVC Carotids 2+ bilat; no bruits. No lymphadenopathy or thyromegaly appreciated. Cor: PMI nondisplaced. Regular rate & rhythm. No rubs, gallops or murmurs. Lungs: Clear, no wheezing  Abdomen: obese, soft, nontender,  nondistended. No hepatosplenomegaly. No bruits or masses. Good bowel sounds. Extremities: No cyanosis, clubbing, rash, edema Neuro: Alert & orientedx3, cranial nerves grossly intact. moves all 4 extremities w/o difficulty. Affect pleasant   Telemetry   Now on NSR, w/ PVCs. HR 80s  EKG    No new EKG to review   Labs    CBC Recent Labs    05/08/20 0209 05/09/20 0449  WBC 7.3 7.2  HGB 13.1 13.6  HCT 40.3 42.0  MCV 92.2 93.1  PLT 225 231   Basic Metabolic Panel Recent Labs    48/54/62 0209 05/08/20 0209 05/09/20 0449 05/10/20 0448  NA 139   < > 139 144  K 3.4*   < > 4.2 3.4*  CL 102   < > 105 105  CO2 22   < > 20* 28  GLUCOSE 122*   < > 122* 127*  BUN 27*   < > 31* 27*  CREATININE 1.58*   < > 1.49* 1.34*  CALCIUM 8.6*   < > 8.6* 8.4*  MG 1.9  --  2.1  --    < > = values in this interval not displayed.   Liver Function Tests Recent Labs    05/09/20 0449 05/10/20 0448  AST 572* 288*  ALT 556* 386*  ALKPHOS 29* 28*  BILITOT 0.8 0.7  PROT 6.8 6.2*  ALBUMIN 3.2* 2.9*   No  results for input(s): LIPASE, AMYLASE in the last 72 hours. Cardiac Enzymes No results for input(s): CKTOTAL, CKMB, CKMBINDEX, TROPONINI in the last 72 hours.  BNP: BNP (last 3 results) Recent Labs    05/07/20 1113  BNP 490.8*    ProBNP (last 3 results) No results for input(s): PROBNP in the last 8760 hours.   D-Dimer No results for input(s): DDIMER in the last 72 hours. Hemoglobin A1C No results for input(s): HGBA1C in the last 72 hours. Fasting Lipid Panel No results for input(s): CHOL, HDL, LDLCALC, TRIG, CHOLHDL, LDLDIRECT in the last 72 hours. Thyroid Function Tests No results for input(s): TSH, T4TOTAL, T3FREE, THYROIDAB in the last 72 hours.  Invalid input(s): FREET3  Other results:   Imaging    DG CHEST PORT 1 VIEW  Result Date: 05/09/2020 CLINICAL DATA:  Central line placement EXAM: PORTABLE CHEST 1 VIEW COMPARISON:  05/09/2020 FINDINGS: Right internal  jugular central venous catheter has been placed with its tip overlying the superior vena cava. Multifocal pulmonary infiltrate, more severe within the right upper lobe, is likely stable when accounting for changes in radiographic technique. No pneumothorax or pleural effusion. Mild cardiomegaly is stable. No acute bone abnormality. IMPRESSION: Right internal jugular central venous catheter tip within the superior vena cava. No pneumothorax. Stable pulmonary infiltrates. Electronically Signed   By: Helyn Numbers MD   On: 05/09/2020 14:17      Medications:     Scheduled Medications: . chlorhexidine  15 mL Mouth Rinse BID  . Chlorhexidine Gluconate Cloth  6 each Topical Daily  . clopidogrel  75 mg Oral Daily  . digoxin  0.125 mg Oral Daily  . fenofibrate  160 mg Oral Daily  . furosemide  80 mg Intravenous BID  . guaiFENesin  1,200 mg Oral BID  . insulin aspart  0-15 Units Subcutaneous TID WC  . insulin aspart  0-5 Units Subcutaneous QHS  . lipase/protease/amylase  24,000 Units Oral TID AC  . mouth rinse  15 mL Mouth Rinse q12n4p  . pantoprazole  40 mg Oral Daily  . potassium chloride  40 mEq Oral Once  . sertraline  50 mg Oral BID  . silver sulfADIAZINE   Topical Daily  . sodium chloride flush  10-40 mL Intracatheter Q12H  . sodium chloride flush  3 mL Intravenous Q12H     Infusions: . sodium chloride 250 mL (05/09/20 1441)  . sodium chloride 10 mL/hr at 05/10/20 0700  . amiodarone 60 mg/hr (05/10/20 0700)  . heparin 2,100 Units/hr (05/10/20 0700)  . milrinone 0.25 mcg/kg/min (05/10/20 0700)     PRN Medications:  sodium chloride, ALPRAZolam, ALPRAZolam, ibuprofen, loperamide HCl, ondansetron (ZOFRAN) IV, sodium chloride flush    Assessment/Plan   1. Acute systolic CHF/cardiogenic shock: Echo this admission with EF < 20%, moderate RV dysfunction.  Cause uncertain, has history of CAD remotely.  He was in atrial flutter with RVR at admission, not sure how long this was  present prior.  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 Initial co-ox 47%. Started on milrinone 0.25.  - Co-ox improved 75% today. Good diuresis w/ IV Lasix, Wt down 10 lb. CVP trending down, 12 today. SCr improving - Continue Milrinone gtt 0.25 - Lasix 80 mg IV bid.  - Continue digoxin 0.125 daily.  - plan RHC/LHC today   - plan [facility] soon if renal function remains stable post cath  - continue IV amio while on milrinone to keep in NSR  2. Atrial flutter with RVR: Not reported in the past.  Admitted in atrial flutter with RVR.  As above, ?tachy-mediated CMP. Converted to NSR overnight - Continue Amiodarone gtt 60 mg/hr for rate control for now.  - Continue heparin gtt>> change to DOAC post cath  - Keep K >4.0 and Mg > 2.0  - Consider eventual atrial fibrillation ablation.  3. CAD: Remote history of CAD/PCI apparently in [place].  No chest pain.  HS-TnI mildly elevated, suspect demand ischemia with CHF/cardiogenic shock.  Plan LHC today  evaluation to r/o ischemic cardiomyopathy.  4. H/o DVT: Prior, provoked by surgery.   5. ?PNA: Covering with ceftriaxone/azithromycin.  6. AKI: Creatinine up to 1.8, down to 1.3 today.  Suspect cardiorenal syndrome.   - Continue milrinone and diuresis.  7. Elevated LFTs: Suspect shock liver.  Trending down, follow.  8. OSA, suspect OHS: Currently on Bipap with pulmonary edema/dyspnea.  9. Hypokalemia: K 3.4 - supp w/ KCl    Length of Stay: 5  Brittainy Simmons, PA-C  05/10/2020, 7:36 AM  Advanced Heart Failure Team Pager [phone] (M-F; 7a - 4p)  Please contact [facility] for night-coverage after hours (4p -7a ) and weekends on [email or web address]  Patient seen with PA, agree with the above note.   He is doing much better today.  He is alert/oriented, no further confusion.  He diuresed well, I/Os net negative 2248.  Co-ox 75% on milrinone 0.25.  CVP remains 12. He converted to NSR overnight on amiodarone gtt.  Creatinine down to 1.34.   General:  NAD Neck: Thick, JVP 10-12, no thyromegaly or thyroid nodule.  Lungs: Clear to auscultation bilaterally with normal respiratory effort. CV: Nondisplaced PMI.  Heart regular S1/S2, no S3/S4, no murmur.  No peripheral edema.   Abdomen: Soft, nontender, no hepatosplenomegaly, no distention.  Skin: Intact without lesions or rashes.  Neurologic: Alert and oriented x 3.  Psych: Normal affect. Extremities: No clubbing or cyanosis.  HEENT: Normal.   Continue milrinone for now and continue Lasix 80 mg IV bid, probably to po tomorrow. Will begin weaning milrinone when he is fully diuresed.  Will continue digoxin and add spironolactone 12.5 daily and losartan 12.5 daily, convert to ARNI if creatinine/BP remain stable.   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  Plan for [facility] today.  If no need for PCI, can stop Plavix and when apixaban started.   Continue amiodarone gtt but decrease to 30 mg/hr.  Continue heparin gtt for now, apixaban after cath.  Eventual atrial flutter ablation.   CRITICAL CARE Performed by: Marca Ancona  Total critical care time: 35 minutes  Critical care time was exclusive of separately billable procedures and treating other patients.  Critical care was necessary to treat or prevent imminent or life-threatening deterioration.  Critical care was time spent personally by me on the following activities: development of treatment plan with patient and/or surrogate as well as nursing, discussions with consultants, evaluation of patient's response to treatment, examination of patient, obtaining history from patient or surrogate, ordering and performing treatments and interventions, ordering and review of laboratory studies, ordering and review of radiographic studies, pulse oximetry and re-evaluation of patient's condition.  Marca Ancona 05/10/2020 8:24 AM

## 2020-05-10 NOTE — Interval H&P Note (Signed)
History and Physical Interval Note:  05/10/2020 1:52 PM  Steven Burnett  has presented today for surgery, with the diagnosis of heart failure.  The various methods of treatment have been discussed with the patient and family. After consideration of risks, benefits and other options for treatment, the patient has consented to  Procedure(s): RIGHT/LEFT HEART CATH AND CORONARY ANGIOGRAPHY (N/A) as a surgical intervention.  The patient's history has been reviewed, patient examined, no change in status, stable for surgery.  I have reviewed the patient's chart and labs.  Questions were answered to the patient's satisfaction.     Everlene Cunning [employer]

## 2020-05-10 NOTE — Progress Notes (Signed)
ANTICOAGULATION CONSULT NOTE - Follow Up Consult  Pharmacy Consult for Heparin> Apixaban > Heparin Indication: aflutter  No Known Allergies  Patient Measurements: Height: 5\' 8"  (172.7 cm) Weight: 124.9 kg (275 lb 5.7 oz) IBW/kg (Calculated) : 68.4 Heparin Dosing Weight:    Vital Signs: Temp: 97.9 F (36.6 C) (10/21 0650) Temp Source: Oral (10/21 0650) BP: 137/93 (10/21 0800) Pulse Rate: 85 (10/21 0800)  Labs: Recent Labs    05/08/20 0209 05/09/20 0449 05/10/20 0448  HGB 13.1 13.6  --   HCT 40.3 42.0  --   PLT 225 231  --   APTT  --   --  74*  HEPARINUNFRC 0.41  --  1.50*  CREATININE 1.58* 1.49* 1.34*    Estimated Creatinine Clearance: 77.3 mL/min (A) (by C-G formula based on SCr of 1.34 mg/dL (H)).   Assessment: 59yom admitted with SOB and new Aflutter.  Started on heparin drip for anticoagulation.  10/19 heparin > apixaban.  10/20 patient continued to be SOB increase confusion> CL placed coox 50 stop beta blocker >start  milrinone and increase  furosemide 80mg  IV bid CVP improved 12, coox improved 70 10/21 Plan for cath 10/21 > stopped  apixaban 10/20 and resume heparin until procedures complete.   Will check aptt to dose heparin as apixaban falsely elevated HL  Heparin drip 2100 uts/hr aott 75sec at goal, no bleeding noted, cbc stable  (HL 1.5 - apixaban affect - falsely elevated) Follow up after cath  Goal of Therapy:  Heparin level 0.3-0.7 units/ml  aptt 66-103 Monitor platelets by anticoagulation protocol: Yes   Plan:  Heparin 2100 uts/hr  Follow up aafter cath Daily aptt/HL, CBC Monitor s/s bleeding  2101 Pharm.D. CPP, BCPS Clinical Pharmacist [phone] 05/10/2020 8:18 AM

## 2020-05-10 NOTE — Progress Notes (Signed)
Patient noted to have a potassium level of 3.4. eLink notified and an order for the electrolyte protocol was placed. Patient is scheduled to receive at 0715, this dose was given early.

## 2020-05-10 NOTE — Progress Notes (Signed)
Patient to cath lab.

## 2020-05-10 NOTE — Progress Notes (Addendum)
Advanced Heart Failure Rounding Note  PCP-Cardiologist: No primary care provider on file.    Patient Profile   59 y/o obese male, former smoker, with h/o CAD w/ remote MI and coronary stenting (done at outside facility, details unknown), OSA w/ reported compliance w/ CPAP, h/o provoked DVT following knee surgery, T2DM, HTN and HLD, admitted for acute hypoxic respiratory failure 2/2 acute CHF>>cardiogenic shock, AFL w/ RVR and ? PNA.    Subjective:    Transferred to ICU yesterday. Central line placed. Initial co-ox 47%. CVP 15. Started on milrinone 0.25 + IV Lasix.   Co-ox improved, 75% today. UOP increased, -4.2L out yesterday.  Wt down 10 lb. CVP 12   Converted to NSR overnight. HR 80s, w/ PVCs.   Scr trending down, 1.6>>1.5>>1.3.  K 3.4   Much more alert today. Feels better. No chest pain. No active dyspnea.   On for [facility] today.    Objective:   Weight Range: 124.9 kg Body mass index is 41.87 kg/m.   Vital Signs:   Temp:  [97.5 F (36.4 C)-98.6 F (37 C)] 97.9 F (36.6 C) (10/21 0650) Pulse Rate:  [31-119] 85 (10/21 0700) Resp:  [16-33] 22 (10/21 0700) BP: (94-148)/(49-112) 145/101 (10/21 0700) SpO2:  [81 %-100 %] 99 % (10/21 0700) Weight:  [124.9 kg] 124.9 kg (10/21 0610) Last BM Date: 05/08/20  Weight change: Filed Weights   05/04/20 2013 05/09/20 0505 05/10/20 0610  Weight: 134.3 kg 129.5 kg 124.9 kg    Intake/Output:   Intake/Output Summary (Last 24 hours) at 05/10/2020 0736 Last data filed at 05/10/2020 0700 Gross per 24 hour  Intake 1926.93 ml  Output 4175 ml  Net -2248.07 ml      Physical Exam    CVP 12 General:  Well appearing, obese male. No resp difficulty HEENT: Normal Neck: Supple. Thick neck, JVP ~12 cm . + Rt IJ CVC Carotids 2+ bilat; no bruits. No lymphadenopathy or thyromegaly appreciated. Cor: PMI nondisplaced. Regular rate & rhythm. No rubs, gallops or murmurs. Lungs: Clear, no wheezing  Abdomen: obese, soft, nontender,  nondistended. No hepatosplenomegaly. No bruits or masses. Good bowel sounds. Extremities: No cyanosis, clubbing, rash, edema Neuro: Alert & orientedx3, cranial nerves grossly intact. moves all 4 extremities w/o difficulty. Affect pleasant   Telemetry   Now on NSR, w/ PVCs. HR 80s  EKG    No new EKG to review   Labs    CBC Recent Labs    05/08/20 0209 05/09/20 0449  WBC 7.3 7.2  HGB 13.1 13.6  HCT 40.3 42.0  MCV 92.2 93.1  PLT 225 231   Basic Metabolic Panel Recent Labs    48/54/62 0209 05/08/20 0209 05/09/20 0449 05/10/20 0448  NA 139   < > 139 144  K 3.4*   < > 4.2 3.4*  CL 102   < > 105 105  CO2 22   < > 20* 28  GLUCOSE 122*   < > 122* 127*  BUN 27*   < > 31* 27*  CREATININE 1.58*   < > 1.49* 1.34*  CALCIUM 8.6*   < > 8.6* 8.4*  MG 1.9  --  2.1  --    < > = values in this interval not displayed.   Liver Function Tests Recent Labs    05/09/20 0449 05/10/20 0448  AST 572* 288*  ALT 556* 386*  ALKPHOS 29* 28*  BILITOT 0.8 0.7  PROT 6.8 6.2*  ALBUMIN 3.2* 2.9*   No  results for input(s): LIPASE, AMYLASE in the last 72 hours. Cardiac Enzymes No results for input(s): CKTOTAL, CKMB, CKMBINDEX, TROPONINI in the last 72 hours.  BNP: BNP (last 3 results) Recent Labs    05/07/20 1113  BNP 490.8*    ProBNP (last 3 results) No results for input(s): PROBNP in the last 8760 hours.   D-Dimer No results for input(s): DDIMER in the last 72 hours. Hemoglobin A1C No results for input(s): HGBA1C in the last 72 hours. Fasting Lipid Panel No results for input(s): CHOL, HDL, LDLCALC, TRIG, CHOLHDL, LDLDIRECT in the last 72 hours. Thyroid Function Tests No results for input(s): TSH, T4TOTAL, T3FREE, THYROIDAB in the last 72 hours.  Invalid input(s): FREET3  Other results:   Imaging    DG CHEST PORT 1 VIEW  Result Date: 05/09/2020 CLINICAL DATA:  Central line placement EXAM: PORTABLE CHEST 1 VIEW COMPARISON:  05/09/2020 FINDINGS: Right internal  jugular central venous catheter has been placed with its tip overlying the superior vena cava. Multifocal pulmonary infiltrate, more severe within the right upper lobe, is likely stable when accounting for changes in radiographic technique. No pneumothorax or pleural effusion. Mild cardiomegaly is stable. No acute bone abnormality. IMPRESSION: Right internal jugular central venous catheter tip within the superior vena cava. No pneumothorax. Stable pulmonary infiltrates. Electronically Signed   By: Helyn Numbers MD   On: 05/09/2020 14:17      Medications:     Scheduled Medications: . chlorhexidine  15 mL Mouth Rinse BID  . Chlorhexidine Gluconate Cloth  6 each Topical Daily  . clopidogrel  75 mg Oral Daily  . digoxin  0.125 mg Oral Daily  . fenofibrate  160 mg Oral Daily  . furosemide  80 mg Intravenous BID  . guaiFENesin  1,200 mg Oral BID  . insulin aspart  0-15 Units Subcutaneous TID WC  . insulin aspart  0-5 Units Subcutaneous QHS  . lipase/protease/amylase  24,000 Units Oral TID AC  . mouth rinse  15 mL Mouth Rinse q12n4p  . pantoprazole  40 mg Oral Daily  . potassium chloride  40 mEq Oral Once  . sertraline  50 mg Oral BID  . silver sulfADIAZINE   Topical Daily  . sodium chloride flush  10-40 mL Intracatheter Q12H  . sodium chloride flush  3 mL Intravenous Q12H     Infusions: . sodium chloride 250 mL (05/09/20 1441)  . sodium chloride 10 mL/hr at 05/10/20 0700  . amiodarone 60 mg/hr (05/10/20 0700)  . heparin 2,100 Units/hr (05/10/20 0700)  . milrinone 0.25 mcg/kg/min (05/10/20 0700)     PRN Medications:  sodium chloride, ALPRAZolam, ALPRAZolam, ibuprofen, loperamide HCl, ondansetron (ZOFRAN) IV, sodium chloride flush    Assessment/Plan   1. Acute systolic CHF/cardiogenic shock: Echo this admission with EF < 20%, moderate RV dysfunction.  Cause uncertain, has history of CAD remotely.  He was in atrial flutter with RVR at admission, not sure how long this was  present prior.  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 Initial co-ox 47%. Started on milrinone 0.25.  - Co-ox improved 75% today. Good diuresis w/ IV Lasix, Wt down 10 lb. CVP trending down, 12 today. SCr improving - Continue Milrinone gtt 0.25 - Lasix 80 mg IV bid.  - Continue digoxin 0.125 daily.  - plan RHC/LHC today   - plan [facility] soon if renal function remains stable post cath  - continue IV amio while on milrinone to keep in NSR  2. Atrial flutter with RVR: Not reported in the past.  Admitted in atrial flutter with RVR.  As above, ?tachy-mediated CMP. Converted to NSR overnight - Continue Amiodarone gtt 60 mg/hr for rate control for now.  - Continue heparin gtt>> change to DOAC post cath  - Keep K >4.0 and Mg > 2.0  - Consider eventual atrial fibrillation ablation.  3. CAD: Remote history of CAD/PCI apparently in [place].  No chest pain.  HS-TnI mildly elevated, suspect demand ischemia with CHF/cardiogenic shock.  Plan [facility] today  evaluation to r/o ischemic cardiomyopathy.  4. H/o DVT: Prior, provoked by surgery.   5. ?PNA: Covering with ceftriaxone/azithromycin.  6. AKI: Creatinine up to 1.8, down to 1.3 today.  Suspect cardiorenal syndrome.   - Continue milrinone and diuresis.  7. Elevated LFTs: Suspect shock liver.  Trending down, follow.  8. OSA, suspect OHS: Currently on Bipap with pulmonary edema/dyspnea.  9. Hypokalemia: K 3.4 - supp w/ KCl    Length of Stay: 5  Brittainy Simmons, PA-C  05/10/2020, 7:36 AM  Advanced Heart Failure Team Pager [phone] (M-F; 7a - 4p)  Please contact [facility] for night-coverage after hours (4p -7a ) and weekends on [email or web address]  Patient seen with PA, agree with the above note.   He is doing much better today.  He is alert/oriented, no further confusion.  He diuresed well, I/Os net negative 2248.  Co-ox 75% on milrinone 0.25.  CVP remains 12. He converted to NSR overnight on amiodarone gtt.  Creatinine down to 1.34.   General:  NAD Neck: Thick, JVP 10-12, no thyromegaly or thyroid nodule.  Lungs: Clear to auscultation bilaterally with normal respiratory effort. CV: Nondisplaced PMI.  Heart regular S1/S2, no S3/S4, no murmur.  No peripheral edema.   Abdomen: Soft, nontender, no hepatosplenomegaly, no distention.  Skin: Intact without lesions or rashes.  Neurologic: Alert and oriented x 3.  Psych: Normal affect. Extremities: No clubbing or cyanosis.  HEENT: Normal.   Continue milrinone for now and continue Lasix 80 mg IV bid, probably to po tomorrow. Will begin weaning milrinone when he is fully diuresed.  Will continue digoxin and add spironolactone 12.5 daily and losartan 12.5 daily, convert to ARNI if creatinine/BP remain stable.   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  Plan for [facility] today.  If no need for PCI, can stop Plavix and when apixaban started.   Continue amiodarone gtt but decrease to 30 mg/hr.  Continue heparin gtt for now, apixaban after cath.  Eventual atrial flutter ablation.   CRITICAL CARE Performed by: Marca Ancona  Total critical care time: 35 minutes  Critical care time was exclusive of separately billable procedures and treating other patients.  Critical care was necessary to treat or prevent imminent or life-threatening deterioration.  Critical care was time spent personally by me on the following activities: development of treatment plan with patient and/or surrogate as well as nursing, discussions with consultants, evaluation of patient's response to treatment, examination of patient, obtaining history from patient or surrogate, ordering and performing treatments and interventions, ordering and review of laboratory studies, ordering and review of radiographic studies, pulse oximetry and re-evaluation of patient's condition.  Marca Ancona 05/10/2020 8:24 AM

## 2020-05-11 ENCOUNTER — Encounter (HOSPITAL_COMMUNITY): Payer: Self-pay | Admitting: Cardiology

## 2020-05-11 ENCOUNTER — Inpatient Hospital Stay (HOSPITAL_COMMUNITY): Payer: BC Managed Care – PPO

## 2020-05-11 DIAGNOSIS — J9601 Acute respiratory failure with hypoxia: Secondary | ICD-10-CM | POA: Diagnosis not present

## 2020-05-11 DIAGNOSIS — I5021 Acute systolic (congestive) heart failure: Secondary | ICD-10-CM | POA: Diagnosis not present

## 2020-05-11 LAB — GLUCOSE, CAPILLARY
Glucose-Capillary: 103 mg/dL — ABNORMAL HIGH (ref 70–99)
Glucose-Capillary: 99 mg/dL (ref 70–99)
Glucose-Capillary: 125 mg/dL — ABNORMAL HIGH (ref 70–99)
Glucose-Capillary: 130 mg/dL — ABNORMAL HIGH (ref 70–99)

## 2020-05-11 LAB — COMPREHENSIVE METABOLIC PANEL
ALT: 289 U/L — ABNORMAL HIGH (ref 0–44)
AST: 160 U/L — ABNORMAL HIGH (ref 15–41)
Albumin: 2.8 g/dL — ABNORMAL LOW (ref 3.5–5.0)
Alkaline Phosphatase: 25 U/L — ABNORMAL LOW (ref 38–126)
Anion gap: 9 (ref 5–15)
BUN: 23 mg/dL — ABNORMAL HIGH (ref 6–20)
CO2: 29 mmol/L (ref 22–32)
Calcium: 8.5 mg/dL — ABNORMAL LOW (ref 8.9–10.3)
Chloride: 104 mmol/L (ref 98–111)
Creatinine, Ser: 1.3 mg/dL — ABNORMAL HIGH (ref 0.61–1.24)
GFR, Estimated: >60 mL/min (ref >60)
Glucose, Bld: 112 mg/dL — ABNORMAL HIGH (ref 70–99)
Potassium: 3.9 mmol/L (ref 3.5–5.1)
Sodium: 142 mmol/L (ref 135–145)
Total Bilirubin: 0.6 mg/dL (ref 0.3–1.2)
Total Protein: 6 g/dL — ABNORMAL LOW (ref 6.5–8.1)

## 2020-05-11 LAB — CBC
HCT: 38.1 % — ABNORMAL LOW (ref 39.0–52.0)
Hemoglobin: 12 g/dL — ABNORMAL LOW (ref 13.0–17.0)
MCH: 29.2 pg (ref 26.0–34.0)
MCHC: 31.5 g/dL (ref 30.0–36.0)
MCV: 92.7 fL (ref 80.0–100.0)
Platelets: 234 10*3/uL (ref 150–400)
RBC: 4.11 MIL/uL — ABNORMAL LOW (ref 4.22–5.81)
RDW: 12.8 % (ref 11.5–15.5)
WBC: 4.7 10*3/uL (ref 4.0–10.5)
nRBC: 1.1 % — ABNORMAL HIGH (ref 0.0–0.2)

## 2020-05-11 LAB — COOXEMETRY PANEL
Carboxyhemoglobin: 0.8 % (ref 0.5–1.5)
Carboxyhemoglobin: 0.9 % (ref 0.5–1.5)
Methemoglobin: 1 % (ref 0.0–1.5)
Methemoglobin: 1 % (ref 0.0–1.5)
O2 Saturation: 54.6 %
O2 Saturation: 71.9 %
Total hemoglobin: 12.6 g/dL (ref 12.0–16.0)
Total hemoglobin: 12.3 g/dL (ref 12.0–16.0)

## 2020-05-11 MED ORDER — SACUBITRIL-VALSARTAN 24-26 MG PO TABS
1.0000 | ORAL_TABLET | Freq: Two times a day (BID) | ORAL | Status: DC
  Administered 2020-05-11 – 2020-05-13 (×5): 1 via ORAL
  Filled 2020-05-11 (×6): qty 1

## 2020-05-11 MED ORDER — ALPRAZOLAM 0.5 MG PO TABS
1.0000 mg | ORAL_TABLET | Freq: Three times a day (TID) | ORAL | Status: DC
  Administered 2020-05-11 – 2020-05-13 (×7): 1 mg via ORAL
  Filled 2020-05-11 (×6): qty 2

## 2020-05-11 MED ORDER — SPIRONOLACTONE 25 MG PO TABS
25.0000 mg | ORAL_TABLET | Freq: Every day | ORAL | Status: DC
  Administered 2020-05-11 – 2020-05-13 (×3): 25 mg via ORAL
  Filled 2020-05-11 (×3): qty 1

## 2020-05-11 MED ORDER — ATORVASTATIN CALCIUM 80 MG PO TABS
80.0000 mg | ORAL_TABLET | Freq: Every day | ORAL | Status: DC
  Administered 2020-05-11 – 2020-05-13 (×3): 80 mg via ORAL
  Filled 2020-05-11 (×3): qty 1

## 2020-05-11 MED ORDER — POTASSIUM CHLORIDE CRYS ER 10 MEQ PO TBCR
20.0000 meq | EXTENDED_RELEASE_TABLET | Freq: Once | ORAL | Status: AC
  Administered 2020-05-11: 20 meq via ORAL
  Filled 2020-05-11: qty 2

## 2020-05-11 MED ORDER — LIDOCAINE HCL (PF) 1 % IJ SOLN
INTRAMUSCULAR | Status: AC
  Filled 2020-05-11: qty 30

## 2020-05-11 MED FILL — Verapamil HCl IV Soln 2.5 MG/ML: INTRAVENOUS | Qty: 2 | Status: AC

## 2020-05-11 MED FILL — Lidocaine HCl Local Preservative Free (PF) Inj 1%: INTRAMUSCULAR | Qty: 30 | Status: AC

## 2020-05-11 MED FILL — Heparin Sod (Porcine)-NaCl IV Soln 1000 Unit/500ML-0.9%: INTRAVENOUS | Qty: 500 | Status: AC

## 2020-05-11 NOTE — Progress Notes (Addendum)
Advanced Heart Failure Rounding Note  PCP-Cardiologist: No primary care provider on file.    Patient Profile   59 y/o obese male, former smoker, with h/o CAD w/ remote MI and coronary stenting (done at outside facility, details unknown), OSA w/ reported compliance w/ CPAP, h/o provoked DVT following knee surgery, T2DM, HTN and HLD, admitted for acute hypoxic respiratory failure 2/2 acute CHF>>cardiogenic shock, AFL w/ RVR and ? PNA.    Subjective:   05/10/20 RHC/LHC  LAD 80% far distal stenosis.  RCA 60% distal RCA stenosis. 50% proximal stenosis in PLV. 80% stenosis mid/distal PDA RA mean 8 RV 44/6 PA 40/23, mean 31 PCWP mean 21 LV 130/30 AO 135/87 Oxygen saturations: PA 70% AO 100% Cardiac Output (Fick) 5.61  Cardiac Index (Fick) 2.4  PVR 1.8 WU  Remains on milrinone 0.25 mcg + amio drip. Co-OX 72%.  CVP:  [8 mmHg-11 mmHg] 10 mmHg   Denies SOB. Denies chest pain.   Objective:   Weight Range: 126.3 kg Body mass index is 42.34 kg/m.   Vital Signs:   Temp:  [90 F (32.2 C)-98.9 F (37.2 C)] 98 F (36.7 C) (10/22 0331) Pulse Rate:  [66-89] 86 (10/22 0700) Resp:  [12-25] 25 (10/22 0700) BP: (0-149)/(0-111) 146/96 (10/22 0700) SpO2:  [0 %-100 %] 95 % (10/22 0700) FiO2 (%):  [40 %] 40 % (10/21 1300) Weight:  [126.3 kg] 126.3 kg (10/22 0500) Last BM Date: 05/10/20  Weight change: Filed Weights   05/09/20 0505 05/10/20 0610 05/11/20 0500  Weight: 129.5 kg 124.9 kg 126.3 kg    Intake/Output:   Intake/Output Summary (Last 24 hours) at 05/11/2020 0731 Last data filed at 05/11/2020 0600 Gross per 24 hour  Intake 1838.88 ml  Output 1800 ml  Net 38.88 ml      Physical Exam   CVP 9-10   General:  No resp difficulty HEENT: normal Neck: supple. JVP 9-10 . Carotids 2+ bilat; no bruits. No lymphadenopathy or thryomegaly appreciated. Cor: PMI nondisplaced. Regular rate & rhythm. No rubs, gallops or murmurs. Lungs: clear Abdomen: soft, nontender,  nondistended. No hepatosplenomegaly. No bruits or masses. Good bowel sounds. Extremities: no cyanosis, clubbing, rash, R and LLE 1+ edema Neuro: alert & orientedx3, cranial nerves grossly intact. moves all 4 extremities w/o difficulty. Affect pleasant   Telemetry   SR with PVCs. 80s   EKG    No new EKG to review   Labs    CBC Recent Labs    05/09/20 0449 05/09/20 0449 05/10/20 1405 05/11/20 0355  WBC 7.2  --   --  4.7  HGB 13.6   < > 12.6*  19.7* 12.0*  HCT 42.0   < > 37.0*  58.0* 38.1*  MCV 93.1  --   --  92.7  PLT 231  --   --  234   < > = values in this interval not displayed.   Basic Metabolic Panel Recent Labs    84/16/60 0449 05/09/20 0449 05/10/20 0448 05/10/20 0448 05/10/20 1405 05/10/20 1405 05/10/20 2021 05/11/20 0216  NA 139   < > 144   < > 148*  149*  --   --  142  K 4.2   < > 3.4*   < > 3.7  3.5   < > 3.6 3.9  CL 105   < > 105  --   --   --   --  104  CO2 20*   < > 28  --   --   --   --  29  GLUCOSE 122*   < > 127*  --   --   --   --  112*  BUN 31*   < > 27*  --   --   --   --  23*  CREATININE 1.49*   < > 1.34*  --   --   --   --  1.30*  CALCIUM 8.6*   < > 8.4*  --   --   --   --  8.5*  MG 2.1  --   --   --   --   --  2.1  --    < > = values in this interval not displayed.   Liver Function Tests Recent Labs    05/10/20 0448 05/11/20 0216  AST 288* 160*  ALT 386* 289*  ALKPHOS 28* 25*  BILITOT 0.7 0.6  PROT 6.2* 6.0*  ALBUMIN 2.9* 2.8*   No results for input(s): LIPASE, AMYLASE in the last 72 hours. Cardiac Enzymes No results for input(s): CKTOTAL, CKMB, CKMBINDEX, TROPONINI in the last 72 hours.  BNP: BNP (last 3 results) Recent Labs    05/07/20 1113  BNP 490.8*    ProBNP (last 3 results) No results for input(s): PROBNP in the last 8760 hours.   D-Dimer No results for input(s): DDIMER in the last 72 hours. Hemoglobin A1C No results for input(s): HGBA1C in the last 72 hours. Fasting Lipid Panel No results for  input(s): CHOL, HDL, LDLCALC, TRIG, CHOLHDL, LDLDIRECT in the last 72 hours. Thyroid Function Tests No results for input(s): TSH, T4TOTAL, T3FREE, THYROIDAB in the last 72 hours.  Invalid input(s): FREET3  Other results:   Imaging    CARDIAC CATHETERIZATION  Result Date: 05/10/2020 1. Filling pressures remain mildly elevated. 2. Good cardiac output on milrinone. 3. Distal vessel coronary disease, no target for intervention.     Medications:     Scheduled Medications: . apixaban  5 mg Oral BID  . chlorhexidine  15 mL Mouth Rinse BID  . Chlorhexidine Gluconate Cloth  6 each Topical Daily  . digoxin  0.125 mg Oral Daily  . fenofibrate  160 mg Oral Daily  . furosemide  80 mg Intravenous BID  . guaiFENesin  1,200 mg Oral BID  . insulin aspart  0-15 Units Subcutaneous TID WC  . insulin aspart  0-5 Units Subcutaneous QHS  . lipase/protease/amylase  24,000 Units Oral TID AC  . losartan  12.5 mg Oral Daily  . mouth rinse  15 mL Mouth Rinse q12n4p  . pantoprazole  40 mg Oral Daily  . sertraline  50 mg Oral BID  . silver sulfADIAZINE   Topical Daily  . sodium chloride flush  10-40 mL Intracatheter Q12H  . sodium chloride flush  3 mL Intravenous Q12H  . sodium chloride flush  3 mL Intravenous Q12H  . spironolactone  12.5 mg Oral Daily    Infusions: . sodium chloride 10 mL/hr at 05/11/20 0600  . sodium chloride    . amiodarone 30 mg/hr (05/11/20 0600)  . milrinone 0.25 mcg/kg/min (05/11/20 0600)    PRN Medications: sodium chloride, sodium chloride, acetaminophen, ALPRAZolam, ALPRAZolam, loperamide HCl, ondansetron (ZOFRAN) IV, sodium chloride flush, sodium chloride flush    Assessment/Plan   1. Acute systolic CHF/cardiogenic shock: Echo this admission with EF < 20%, moderate RV dysfunction.  Cause uncertain, has history of CAD remotely.  He was in atrial flutter with RVR at admission, not sure how long this was present prior.  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  Initial co-ox 47%. Started on milrinone 0.25.  -S/P RHC/LHC 10/21  - Todays CO-OX 72% on Milrinone gtt 0.25 - I/O not accurate. CVP 9-10. Continue Lasix 80 mg IV bid.  - Continue digoxin 0.125 daily.  - Add entresto 24-26 mg twice a day.  - Increase spiro 25 mg daily. - Add SGLTi tomorrow.  -Renal function stable.    2. Atrial flutter with RVR: Not reported in the past.  Admitted in atrial flutter with RVR.  As above, ?tachy-mediated CMP. Converted to NSR overnight - Continue Amiodarone gtt 30 mg per hour while on milrinone.   - Continue eliquis 5 mg twice a day.  - Keep K >4.0 and Mg > 2.0  - Consider eventual atrial fibrillation ablation.  3. CAD: Remote history of CAD/PCI apparently in [place].  No chest pain.  HS-TnI mildly elevated, suspect demand ischemia with CHF/cardiogenic shock.   - Distal coronary disease no target for intervention.  - Add atorvastatin 80 mg daily.  4. H/o DVT: Prior, provoked by surgery.   5. ?PNA: Covering with ceftriaxone/azithromycin.  6. AKI: Creatinine up to 1.8, down to 1.3.  Suspect cardiorenal syndrome.   - Continue milrinone and diuresis.  7. Elevated LFTs: Suspect shock liver.  Trending down, follow.  8. OSA, suspect OHS: Currently on Bipap with pulmonary edema/dyspnea.  9. Hypokalemia: K 3.9 . Stable .    Consult PT. Ambulate.   Length of Stay: 6  Amy Clegg, NP  05/11/2020, 7:31 AM  Advanced Heart Failure Team Pager [phone] (M-F; 7a - 4p)  Please contact [facility] for night-coverage after hours (4p -7a ) and weekends on [email or web address]  Patient seen with NP, agree with the above note.   CVP 9-10 with co-ox 72% on milrinone 0.25.  He remains in NSR on amiodarone gtt. No complaints this morning.   General: NAD Neck: Thick, JVP 8-9 cm, no thyromegaly or thyroid nodule.  Lungs: Clear to auscultation bilaterally with normal respiratory effort. CV: Nondisplaced PMI.  Heart regular S1/S2, no S3/S4, no murmur.  No peripheral edema.     Abdomen: Soft, nontender, no hepatosplenomegaly, no distention.  Skin: Intact without lesions or rashes.  Neurologic: Alert and oriented x 3.  Psych: Normal affect. Extremities: No clubbing or cyanosis.  HEENT: Normal.   Today will wean milrinone to 0.125.  Continue Lasix 80 mg IV bid.  Continue digoxin, add Entresto 24/26 bid, increase spironolactone to 25 mg daily.   Continue amiodarone gtt while on milrinone.  Continue Eliquis.  Will need eventual atrial flutter ablation.   Mobilize out of bed, can go to step down.   Marca Ancona 05/11/2020 8:58 AM

## 2020-05-11 NOTE — TOC Benefit Eligibility Note (Signed)
Transition of Care [facility]) Benefit Eligibility Note    Patient Details  Name: Steven Burnett MRN: [id number] Date of Birth: Jan 12, 1961   Medication/Dose: Sherryll Burger 49mg . bid for 30 day supply Eliquis 5mg . bid for 30 day supply  Covered?: Yes  Tier:  (?)  Prescription Coverage Preferred Pharmacy: [facility] [place](mail order pharmacy)  Spoke with Person/Company/Phone Number:: [facility] PH# [phone]  Co-Pay: $50.00 Bobbye Riggs and $30.00 Eliquis  Prior Approval: No  Deductible:  (?)       [phone] Phone Number: 05/11/2020, 12:10 PM

## 2020-05-11 NOTE — Progress Notes (Signed)
Benefit check requested for Entresto 49mg  bid and apixaban 5mg  BID.  Uchechukwu Dhawan LCSW

## 2020-05-12 DIAGNOSIS — I5021 Acute systolic (congestive) heart failure: Secondary | ICD-10-CM | POA: Diagnosis not present

## 2020-05-12 DIAGNOSIS — J9601 Acute respiratory failure with hypoxia: Secondary | ICD-10-CM | POA: Diagnosis not present

## 2020-05-12 DIAGNOSIS — I4892 Unspecified atrial flutter: Secondary | ICD-10-CM | POA: Diagnosis not present

## 2020-05-12 LAB — COMPREHENSIVE METABOLIC PANEL
ALT: 216 U/L — ABNORMAL HIGH (ref 0–44)
AST: 120 U/L — ABNORMAL HIGH (ref 15–41)
Albumin: 2.9 g/dL — ABNORMAL LOW (ref 3.5–5.0)
Alkaline Phosphatase: 27 U/L — ABNORMAL LOW (ref 38–126)
Anion gap: 10 (ref 5–15)
BUN: 17 mg/dL (ref 6–20)
CO2: 28 mmol/L (ref 22–32)
Calcium: 9.1 mg/dL (ref 8.9–10.3)
Chloride: 102 mmol/L (ref 98–111)
Creatinine, Ser: 1.2 mg/dL (ref 0.61–1.24)
GFR, Estimated: >60 mL/min (ref >60)
Glucose, Bld: 110 mg/dL — ABNORMAL HIGH (ref 70–99)
Potassium: 3.6 mmol/L (ref 3.5–5.1)
Sodium: 140 mmol/L (ref 135–145)
Total Bilirubin: 0.9 mg/dL (ref 0.3–1.2)
Total Protein: 6.3 g/dL — ABNORMAL LOW (ref 6.5–8.1)

## 2020-05-12 LAB — CBC
HCT: 41.2 % (ref 39.0–52.0)
Hemoglobin: 13.5 g/dL (ref 13.0–17.0)
MCH: 29.9 pg (ref 26.0–34.0)
MCHC: 32.8 g/dL (ref 30.0–36.0)
MCV: 91.2 fL (ref 80.0–100.0)
Platelets: 291 10*3/uL (ref 150–400)
RBC: 4.52 MIL/uL (ref 4.22–5.81)
RDW: 12.8 % (ref 11.5–15.5)
WBC: 4.3 10*3/uL (ref 4.0–10.5)
nRBC: 0.5 % — ABNORMAL HIGH (ref 0.0–0.2)

## 2020-05-12 LAB — COOXEMETRY PANEL
Carboxyhemoglobin: 0.8 % (ref 0.5–1.5)
Methemoglobin: 0.8 % (ref 0.0–1.5)
O2 Saturation: 67 %
Total hemoglobin: 13.9 g/dL (ref 12.0–16.0)

## 2020-05-12 LAB — GLUCOSE, CAPILLARY
Glucose-Capillary: 190 mg/dL — ABNORMAL HIGH (ref 70–99)
Glucose-Capillary: 208 mg/dL — ABNORMAL HIGH (ref 70–99)
Glucose-Capillary: 97 mg/dL (ref 70–99)
Glucose-Capillary: 138 mg/dL — ABNORMAL HIGH (ref 70–99)

## 2020-05-12 MED ORDER — FUROSEMIDE 10 MG/ML IJ SOLN
80.0000 mg | Freq: Two times a day (BID) | INTRAMUSCULAR | Status: AC
  Administered 2020-05-12: 80 mg via INTRAVENOUS

## 2020-05-12 MED ORDER — FUROSEMIDE 10 MG/ML IJ SOLN
80.0000 mg | Freq: Once | INTRAMUSCULAR | Status: AC
  Administered 2020-05-12: 80 mg via INTRAVENOUS
  Filled 2020-05-12: qty 8

## 2020-05-12 MED ORDER — POTASSIUM CHLORIDE CRYS ER 20 MEQ PO TBCR
40.0000 meq | EXTENDED_RELEASE_TABLET | Freq: Once | ORAL | Status: AC
  Administered 2020-05-12: 40 meq via ORAL
  Filled 2020-05-12: qty 2

## 2020-05-12 MED ORDER — DAPAGLIFLOZIN PROPANEDIOL 10 MG PO TABS
10.0000 mg | ORAL_TABLET | Freq: Every day | ORAL | Status: DC
  Administered 2020-05-12 – 2020-05-13 (×2): 10 mg via ORAL
  Filled 2020-05-12 (×3): qty 1

## 2020-05-12 MED ORDER — AMIODARONE HCL 200 MG PO TABS
200.0000 mg | ORAL_TABLET | Freq: Two times a day (BID) | ORAL | Status: DC
  Administered 2020-05-12 – 2020-05-13 (×3): 200 mg via ORAL
  Filled 2020-05-12 (×3): qty 1

## 2020-05-12 NOTE — Progress Notes (Signed)
Pt does not want to put cpap on now. He will have the nurse contact RT when he is ready to go to bed.

## 2020-05-12 NOTE — Progress Notes (Signed)
CARDIAC REHAB PHASE I   PRE:  Rate/Rhythm:   BP:  Supine:   Sitting:   Standing:    SaO2:   MODE:  Ambulation: 340 ft   POST:  Rate/Rhythm: 95 SR  BP:  Supine:   Sitting: 111/63  Standing:    SaO2: 94 RA 1315-1250 On arrival pt up at door with nurse. I took over there. Assisted X 1 and used walker to ambulate. Gait steady with walker, slow pace. Pt walked 340 feet without c/o. VS stable. Pt to recliner after walk with call light in reach. Left pt Living with Heart Failure booklet and low sodium diet information.  Melina Copa RN 05/12/2020 1:47 PM

## 2020-05-12 NOTE — Progress Notes (Signed)
Patient ID: Steven Burnett, male   DOB: [birth year], 59 y.o.   MRN: [id number]     Advanced Heart Failure Rounding Note  PCP-Cardiologist: No primary care provider on file.    Patient Profile   59 y/o obese male, former smoker, with h/o CAD w/ remote MI and coronary stenting (done at outside facility, details unknown), OSA w/ reported compliance w/ CPAP, h/o provoked DVT following knee surgery, T2DM, HTN and HLD, admitted for acute hypoxic respiratory failure 2/2 acute CHF>>cardiogenic shock, AFL w/ RVR and ? PNA.    Subjective:    No complaints this morning.  He remains on milrinone 0.125 and amiodarone gtt.  Weight down 6 lbs.  CVP about 10 today with co-ox 67%.  He remains in NSR.  RHC/LHC:  Diagnostic Dominance: Right Left Main  No significant disease.  Left Anterior Descending  80% stenosis far distal LAD.  Left Circumflex  Patent stent in OM1. No significant obstructive disease.  Right Coronary Artery  Diffuse luminal irregularities. 60% distal RCA stenosis. 50% proximal stenosis in PLV. 80% stenosis mid/distal PDA.  Intervention  No interventions have been documented. Right Heart  Right Heart Pressures RHC Procedural Findings (milrinone 0.25): Hemodynamics (mmHg) RA mean 8 RV 44/6 PA 40/23, mean 31 PCWP mean 21 LV 130/30 AO 135/87  Oxygen saturations: PA 70% AO 100%  Cardiac Output (Fick) 5.61  Cardiac Index (Fick) 2.4  PVR 1.8 WU     Objective:   Weight Range: 123.8 kg Body mass index is 41.5 kg/m.   Vital Signs:   Temp:  [97.7 F (36.5 C)-98.7 F (37.1 C)] 97.8 F (36.6 C) (10/23 0800) Pulse Rate:  [83-92] 84 (10/23 0800) Resp:  [14-22] 20 (10/23 0800) BP: (102-150)/(61-113) 102/61 (10/23 0800) SpO2:  [85 %-99 %] 99 % (10/23 0800) Weight:  [123.8 kg] 123.8 kg (10/23 0525) Last BM Date: 05/12/20  Weight change: Filed Weights   05/10/20 0610 05/11/20 0500 05/12/20 0525  Weight: 124.9 kg 126.3 kg 123.8 kg     Intake/Output:   Intake/Output Summary (Last 24 hours) at 05/12/2020 0924 Last data filed at 05/12/2020 0800 Gross per 24 hour  Intake 604 ml  Output 2650 ml  Net -2046 ml      Physical Exam  CVP 9-10   General: NAD Neck: Thick, JVP 10 cm, no thyromegaly or thyroid nodule.  Lungs: Clear to auscultation bilaterally with normal respiratory effort. CV: Nondisplaced PMI.  Heart regular S1/S2, no S3/S4, no murmur.  No peripheral edema.   Abdomen: Soft, nontender, no hepatosplenomegaly, no distention.  Skin: Intact without lesions or rashes.  Neurologic: Alert and oriented x 3.  Psych: Normal affect. Extremities: No clubbing or cyanosis.  HEENT: Normal.    Telemetry   SR with PVCs (personally reviewed)  EKG    No new EKG to review   Labs    CBC Recent Labs    05/11/20 0355 05/12/20 0315  WBC 4.7 4.3  HGB 12.0* 13.5  HCT 38.1* 41.2  MCV 92.7 91.2  PLT 234 291   Basic Metabolic Panel Recent Labs    38/46/65 0448 05/10/20 2021 05/11/20 0216 05/12/20 0315  NA   < >  --  142 140  K   < > 3.6 3.9 3.6  CL   < >  --  104 102  CO2   < >  --  29 28  GLUCOSE   < >  --  112* 110*  BUN   < >  --  23* 17  CREATININE   < >  --  1.30* 1.20  CALCIUM   < >  --  8.5* 9.1  MG  --  2.1  --   --    < > = values in this interval not displayed.   Liver Function Tests Recent Labs    05/11/20 0216 05/12/20 0315  AST 160* 120*  ALT 289* 216*  ALKPHOS 25* 27*  BILITOT 0.6 0.9  PROT 6.0* 6.3*  ALBUMIN 2.8* 2.9*   No results for input(s): LIPASE, AMYLASE in the last 72 hours. Cardiac Enzymes No results for input(s): CKTOTAL, CKMB, CKMBINDEX, TROPONINI in the last 72 hours.  BNP: BNP (last 3 results) Recent Labs    05/07/20 1113  BNP 490.8*    ProBNP (last 3 results) No results for input(s): PROBNP in the last 8760 hours.   D-Dimer No results for input(s): DDIMER in the last 72 hours. Hemoglobin A1C No results for input(s): HGBA1C in the last 72  hours. Fasting Lipid Panel No results for input(s): CHOL, HDL, LDLCALC, TRIG, CHOLHDL, LDLDIRECT in the last 72 hours. Thyroid Function Tests No results for input(s): TSH, T4TOTAL, T3FREE, THYROIDAB in the last 72 hours.  Invalid input(s): FREET3  Other results:   Imaging    No results found.   Medications:     Scheduled Medications: . ALPRAZolam  1 mg Oral TID  . amiodarone  200 mg Oral BID  . apixaban  5 mg Oral BID  . atorvastatin  80 mg Oral Daily  . chlorhexidine  15 mL Mouth Rinse BID  . Chlorhexidine Gluconate Cloth  6 each Topical Daily  . digoxin  0.125 mg Oral Daily  . fenofibrate  160 mg Oral Daily  . furosemide  80 mg Intravenous BID  . guaiFENesin  1,200 mg Oral BID  . insulin aspart  0-15 Units Subcutaneous TID WC  . insulin aspart  0-5 Units Subcutaneous QHS  . lipase/protease/amylase  24,000 Units Oral TID AC  . mouth rinse  15 mL Mouth Rinse q12n4p  . pantoprazole  40 mg Oral Daily  . sacubitril-valsartan  1 tablet Oral BID  . sertraline  50 mg Oral BID  . silver sulfADIAZINE   Topical Daily  . sodium chloride flush  10-40 mL Intracatheter Q12H  . sodium chloride flush  3 mL Intravenous Q12H  . sodium chloride flush  3 mL Intravenous Q12H  . spironolactone  25 mg Oral Daily    Infusions: . sodium chloride 10 mL/hr at 05/11/20 1600  . sodium chloride      PRN Medications: sodium chloride, sodium chloride, acetaminophen, loperamide HCl, ondansetron (ZOFRAN) IV, sodium chloride flush, sodium chloride flush    Assessment/Plan   1. Acute systolic CHF/cardiogenic shock: Echo this admission with EF < 20%, moderate RV dysfunction.  He was in atrial flutter with RVR at admission, not sure how long this was present prior.  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 Coronary angiography showed primarily distal vessel CAD, no intervention. He is now back in NSR.  Continues on milrinone 0.125 with co-ox 67%, CVP 9-10.  - Stop milrinone today.  - Will give IV  Lasix today then stop, probably to po torsemide tomorrow.   - Continue digoxin 0.125 daily.  - Continue Entresto 24-26 mg twice a day.  - Continue spironolactone 25 mg daily. - Add Farxiga 10 mg daily.     2. Atrial flutter with RVR: Not reported in the past.  Admitted in atrial flutter with RVR.  As  above, ?tachy-mediated CMP. Converted to NSR.  - Stop amiodarone gtt and start po amiodarone.   - Continue eliquis 5 mg twice a day.  - Will need eventual flutter ablation, will refer to EP as outpatient.  3. CAD: Remote history of CAD/PCI apparently in [place].  No chest pain.  HS-TnI mildly elevated, suspect demand ischemia with CHF/cardiogenic shock.  Coronary angiography this admission with distal coronary disease, no target for intervention.  - Continue statin.  4. H/o DVT: Prior, provoked by surgery.   5. ?PNA: Unlikely, more likely pulmonary edema.  Had course of ceftriaxone/azithromycin.  6. AKI: Creatinine up to 1.8, down to 1.2.  Suspect cardiorenal syndrome.   7. Elevated LFTs: Suspect shock liver.  Trending down, follow.  8. OSA: Using CPAP at night.    Mobilize. Home soon.   Length of Stay: 7  Marca Ancona, MD  05/12/2020, 9:24 AM  Advanced Heart Failure Team Pager [phone] (M-F; 7a - 4p)  Please contact [facility] for night-coverage after hours (4p -7a ) and weekends on [email or web address]

## 2020-05-13 ENCOUNTER — Encounter (HOSPITAL_COMMUNITY): Payer: Self-pay | Admitting: Internal Medicine

## 2020-05-13 DIAGNOSIS — I5021 Acute systolic (congestive) heart failure: Secondary | ICD-10-CM | POA: Diagnosis not present

## 2020-05-13 DIAGNOSIS — J9601 Acute respiratory failure with hypoxia: Secondary | ICD-10-CM | POA: Diagnosis not present

## 2020-05-13 LAB — COMPREHENSIVE METABOLIC PANEL
ALT: 172 U/L — ABNORMAL HIGH (ref 0–44)
AST: 86 U/L — ABNORMAL HIGH (ref 15–41)
Albumin: 3.2 g/dL — ABNORMAL LOW (ref 3.5–5.0)
Alkaline Phosphatase: 28 U/L — ABNORMAL LOW (ref 38–126)
Anion gap: 11 (ref 5–15)
BUN: 15 mg/dL (ref 6–20)
CO2: 30 mmol/L (ref 22–32)
Calcium: 9.4 mg/dL (ref 8.9–10.3)
Chloride: 100 mmol/L (ref 98–111)
Creatinine, Ser: 1.22 mg/dL (ref 0.61–1.24)
GFR, Estimated: >60 mL/min (ref >60)
Glucose, Bld: 107 mg/dL — ABNORMAL HIGH (ref 70–99)
Potassium: 3.9 mmol/L (ref 3.5–5.1)
Sodium: 141 mmol/L (ref 135–145)
Total Bilirubin: 0.8 mg/dL (ref 0.3–1.2)
Total Protein: 7.2 g/dL (ref 6.5–8.1)

## 2020-05-13 LAB — MAGNESIUM: Magnesium: 2.2 mg/dL (ref 1.7–2.4)

## 2020-05-13 LAB — COOXEMETRY PANEL
Carboxyhemoglobin: 0.7 % (ref 0.5–1.5)
Methemoglobin: 0.9 % (ref 0.0–1.5)
O2 Saturation: 57.2 %
Total hemoglobin: 15.6 g/dL (ref 12.0–16.0)

## 2020-05-13 LAB — CBC
HCT: 47.2 % (ref 39.0–52.0)
Hemoglobin: 15.2 g/dL (ref 13.0–17.0)
MCH: 29.3 pg (ref 26.0–34.0)
MCHC: 32.2 g/dL (ref 30.0–36.0)
MCV: 90.9 fL (ref 80.0–100.0)
Platelets: 362 10*3/uL (ref 150–400)
RBC: 5.19 MIL/uL (ref 4.22–5.81)
RDW: 13.1 % (ref 11.5–15.5)
WBC: 5.8 10*3/uL (ref 4.0–10.5)
nRBC: 0 % (ref 0.0–0.2)

## 2020-05-13 LAB — GLUCOSE, CAPILLARY
Glucose-Capillary: 166 mg/dL — ABNORMAL HIGH (ref 70–99)
Glucose-Capillary: 108 mg/dL — ABNORMAL HIGH (ref 70–99)

## 2020-05-13 MED ORDER — POTASSIUM CHLORIDE CRYS ER 20 MEQ PO TBCR
20.0000 meq | EXTENDED_RELEASE_TABLET | Freq: Every day | ORAL | Status: DC
  Administered 2020-05-13: 20 meq via ORAL
  Filled 2020-05-13: qty 1

## 2020-05-13 MED ORDER — AMIODARONE HCL 200 MG PO TABS: ORAL_TABLET | ORAL | 3 refills | Status: DC

## 2020-05-13 MED ORDER — DAPAGLIFLOZIN PROPANEDIOL 10 MG PO TABS: 10.0000 mg | ORAL_TABLET | Freq: Every day | ORAL | 0 refills | Status: DC

## 2020-05-13 MED ORDER — ATORVASTATIN CALCIUM 80 MG PO TABS: 80.0000 mg | ORAL_TABLET | Freq: Every day | ORAL | 1 refills | Status: DC

## 2020-05-13 MED ORDER — APIXABAN 5 MG PO TABS: 5.0000 mg | ORAL_TABLET | Freq: Two times a day (BID) | ORAL | 0 refills | Status: DC

## 2020-05-13 MED ORDER — TORSEMIDE 20 MG PO TABS
20.0000 mg | ORAL_TABLET | Freq: Every day | ORAL | Status: DC
  Administered 2020-05-13: 20 mg via ORAL
  Filled 2020-05-13: qty 1

## 2020-05-13 MED ORDER — APIXABAN 5 MG PO TABS: 5.0000 mg | ORAL_TABLET | Freq: Two times a day (BID) | ORAL | 1 refills | Status: DC

## 2020-05-13 MED ORDER — SPIRONOLACTONE 25 MG PO TABS: 25.0000 mg | ORAL_TABLET | Freq: Every day | ORAL | 1 refills | Status: AC

## 2020-05-13 MED ORDER — SACUBITRIL-VALSARTAN 24-26 MG PO TABS: 1.0000 | ORAL_TABLET | Freq: Two times a day (BID) | ORAL | 1 refills | Status: DC

## 2020-05-13 MED ORDER — DIGOXIN 125 MCG PO TABS: 0.1250 mg | ORAL_TABLET | Freq: Every day | ORAL | 1 refills | Status: DC

## 2020-05-13 MED ORDER — TORSEMIDE 20 MG PO TABS: 20.0000 mg | ORAL_TABLET | Freq: Every day | ORAL | 1 refills | Status: DC

## 2020-05-13 MED ORDER — SACUBITRIL-VALSARTAN 24-26 MG PO TABS: 1.0000 | ORAL_TABLET | Freq: Two times a day (BID) | ORAL | 0 refills | Status: DC

## 2020-05-13 MED ORDER — DAPAGLIFLOZIN PROPANEDIOL 10 MG PO TABS: 10.0000 mg | ORAL_TABLET | Freq: Every day | ORAL | 1 refills | Status: DC

## 2020-05-13 MED ORDER — POTASSIUM CHLORIDE CRYS ER 20 MEQ PO TBCR: 20.0000 meq | EXTENDED_RELEASE_TABLET | Freq: Every day | ORAL | 1 refills | Status: DC

## 2020-05-13 NOTE — Progress Notes (Signed)
Patient ID: Steven Burnett, male   DOB: 1960-12-08, 59 y.o.   MRN: [id number]     Advanced Heart Failure Rounding Note  PCP-Cardiologist: No primary care provider on file.    Patient Profile   59 y/o obese male, former smoker, with h/o CAD w/ remote MI and coronary stenting (done at outside facility, details unknown), OSA w/ reported compliance w/ CPAP, h/o provoked DVT following knee surgery, T2DM, HTN and HLD, admitted for acute hypoxic respiratory failure 2/2 acute CHF>>cardiogenic shock, AFL w/ RVR and ? PNA.    Subjective:    No complaints this morning.  He is off milrinone and on po amiodarone.  He remains in NSR.  Weight down another 3 lbs.  CVP 5 today with co-ox 57%.  He has walked in the hall, no dyspnea.   RHC/LHC:  Diagnostic Dominance: Right Left Main  No significant disease.  Left Anterior Descending  80% stenosis far distal LAD.  Left Circumflex  Patent stent in OM1. No significant obstructive disease.  Right Coronary Artery  Diffuse luminal irregularities. 60% distal RCA stenosis. 50% proximal stenosis in PLV. 80% stenosis mid/distal PDA.  Intervention  No interventions have been documented. Right Heart  Right Heart Pressures RHC Procedural Findings (milrinone 0.25): Hemodynamics (mmHg) RA mean 8 RV 44/6 PA 40/23, mean 31 PCWP mean 21 LV 130/30 AO 135/87  Oxygen saturations: PA 70% AO 100%  Cardiac Output (Fick) 5.61  Cardiac Index (Fick) 2.4  PVR 1.8 WU     Objective:   Weight Range: 122.3 kg Body mass index is 41 kg/m.   Vital Signs:   Temp:  [97.6 F (36.4 C)-98.2 F (36.8 C)] 97.9 F (36.6 C) (10/24 1049) Pulse Rate:  [72-84] 82 (10/24 1049) Resp:  [14-23] 15 (10/24 1049) BP: (102-127)/(75-88) 114/83 (10/24 1049) SpO2:  [95 %-96 %] 96 % (10/24 1049) Weight:  [122.3 kg] 122.3 kg (10/24 0458) Last BM Date: 05/12/20  Weight change: Filed Weights   05/11/20 0500 05/12/20 0525 05/13/20 0458  Weight: 126.3 kg 123.8 kg 122.3 kg     Intake/Output:   Intake/Output Summary (Last 24 hours) at 05/13/2020 1132 Last data filed at 05/13/2020 0800 Gross per 24 hour  Intake 1060 ml  Output 1850 ml  Net -790 ml      Physical Exam  CVP 5  General: NAD Neck: Thick, no JVD, no thyromegaly or thyroid nodule.  Lungs: Clear to auscultation bilaterally with normal respiratory effort. CV: Nondisplaced PMI.  Heart regular S1/S2, no S3/S4, no murmur.  No peripheral edema. Abdomen: Soft, nontender, no hepatosplenomegaly, no distention.  Skin: Intact without lesions or rashes.  Neurologic: Alert and oriented x 3.  Psych: Normal affect. Extremities: No clubbing or cyanosis.  HEENT: Normal.     Telemetry   SR with PVCs (personally reviewed)  EKG    No new EKG to review   Labs    CBC Recent Labs    05/12/20 0315 05/13/20 0506  WBC 4.3 5.8  HGB 13.5 15.2  HCT 41.2 47.2  MCV 91.2 90.9  PLT 291 362   Basic Metabolic Panel Recent Labs    56/38/75 2021 05/11/20 0216 05/12/20 0315 05/13/20 0506  NA  --    < > 140 141  K 3.6   < > 3.6 3.9  CL  --    < > 102 100  CO2  --    < > 28 30  GLUCOSE  --    < > 110* 107*  BUN  --    < >  17 15  CREATININE  --    < > 1.20 1.22  CALCIUM  --    < > 9.1 9.4  MG 2.1  --   --  2.2   < > = values in this interval not displayed.   Liver Function Tests Recent Labs    05/12/20 0315 05/13/20 0506  AST 120* 86*  ALT 216* 172*  ALKPHOS 27* 28*  BILITOT 0.9 0.8  PROT 6.3* 7.2  ALBUMIN 2.9* 3.2*   No results for input(s): LIPASE, AMYLASE in the last 72 hours. Cardiac Enzymes No results for input(s): CKTOTAL, CKMB, CKMBINDEX, TROPONINI in the last 72 hours.  BNP: BNP (last 3 results) Recent Labs    05/07/20 1113  BNP 490.8*    ProBNP (last 3 results) No results for input(s): PROBNP in the last 8760 hours.   D-Dimer No results for input(s): DDIMER in the last 72 hours. Hemoglobin A1C No results for input(s): HGBA1C in the last 72 hours. Fasting Lipid  Panel No results for input(s): CHOL, HDL, LDLCALC, TRIG, CHOLHDL, LDLDIRECT in the last 72 hours. Thyroid Function Tests No results for input(s): TSH, T4TOTAL, T3FREE, THYROIDAB in the last 72 hours.  Invalid input(s): FREET3  Other results:   Imaging    No results found.   Medications:     Scheduled Medications: . ALPRAZolam  1 mg Oral TID  . amiodarone  200 mg Oral BID  . apixaban  5 mg Oral BID  . atorvastatin  80 mg Oral Daily  . chlorhexidine  15 mL Mouth Rinse BID  . Chlorhexidine Gluconate Cloth  6 each Topical Daily  . dapagliflozin propanediol  10 mg Oral Daily  . digoxin  0.125 mg Oral Daily  . fenofibrate  160 mg Oral Daily  . guaiFENesin  1,200 mg Oral BID  . insulin aspart  0-15 Units Subcutaneous TID WC  . insulin aspart  0-5 Units Subcutaneous QHS  . lipase/protease/amylase  24,000 Units Oral TID AC  . mouth rinse  15 mL Mouth Rinse q12n4p  . pantoprazole  40 mg Oral Daily  . potassium chloride  20 mEq Oral Daily  . sacubitril-valsartan  1 tablet Oral BID  . sertraline  50 mg Oral BID  . silver sulfADIAZINE   Topical Daily  . sodium chloride flush  10-40 mL Intracatheter Q12H  . sodium chloride flush  3 mL Intravenous Q12H  . sodium chloride flush  3 mL Intravenous Q12H  . spironolactone  25 mg Oral Daily  . torsemide  20 mg Oral Daily    Infusions: . sodium chloride 10 mL/hr at 05/11/20 1600  . sodium chloride      PRN Medications: sodium chloride, sodium chloride, acetaminophen, loperamide HCl, ondansetron (ZOFRAN) IV, sodium chloride flush, sodium chloride flush    Assessment/Plan   1. Acute systolic CHF/cardiogenic shock: Echo this admission with EF < 20%, moderate RV dysfunction.  He was in atrial flutter with RVR at admission, not sure how long this was present prior.  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 Coronary angiography showed primarily distal vessel CAD, no intervention. He is now back in NSR. He is off milrinone with co-ox 57%,  CVP 5.  - Start torsemide 20 mg daily with KCl 10 daily.  - Continue digoxin 0.125 daily.  - Continue Entresto 24-26 mg twice a day.  - Continue spironolactone 25 mg daily. - Continue Farxiga 10 mg daily.     2. Atrial flutter with RVR: Not reported in the past.  Admitted in  atrial flutter with RVR.  As above, ?tachy-mediated CMP. Converted to NSR.  - Continue po amiodarone.  - Continue eliquis 5 mg twice a day.  - Will need eventual flutter ablation, will refer to EP as outpatient.  3. CAD: Remote history of CAD/PCI apparently in [place].  No chest pain.  HS-TnI mildly elevated, suspect demand ischemia with CHF/cardiogenic shock.  Coronary angiography this admission with distal coronary disease, no target for intervention.  - Continue statin.  4. H/o DVT: Prior, provoked by surgery.   5. ?PNA: Unlikely, more likely pulmonary edema.  Had course of ceftriaxone/azithromycin.  6. AKI: Creatinine up to 1.8, down to 1.2.  Suspect cardiorenal syndrome.   7. Elevated LFTs: Suspect shock liver.  Trended down. 8. OSA: Using CPAP at night.   9. Disposition: Home today.  Needs CHF clinic followup 7-10 days.  Meds for home: digoxin 0.125 daily, torsemide 20 mg daily, KCl 20 mEq daily, spironolactone 25 mg daily, Entresto 24/26 bid, Farxiga 10 mg daily, amiodarone 200 mg bid x 7 days then 200 mg daily, Eliquis 5 mg bid, atorvastatin 80 daily, fenofibrate.   Length of Stay: 8  Marca Ancona, MD  05/13/2020, 11:32 AM  Advanced Heart Failure Team Pager [phone] (M-F; 7a - 4p)  Please contact [facility] for night-coverage after hours (4p -7a ) and weekends on [email or web address]

## 2020-05-13 NOTE — Plan of Care (Signed)

## 2020-05-13 NOTE — Progress Notes (Signed)
Pt got discharged to home, discharge instructions provided and patient showed understanding to it, IV taken out,Telemonitor DC,pt left unit in wheelchair with all of the belongings accompanied with a family member (wife)  Yessenia Maillet,RN 

## 2020-05-13 NOTE — Discharge Summary (Addendum)
Discharge Summary    Patient ID: Steven Burnett MRN: [id number]; DOB: 1960/10/05  Admit date: 05/04/2020 Discharge date: 05/13/2020  Primary Care Provider: Toma Deiters, MD  Primary Cardiologist: Thurmon Fair, MD  Advanced Heart Failure: Dr. Shirlee Latch  Discharge Diagnoses    Principal Problem:   Acute respiratory failure with hypoxia [facility]) Active Problems:   Atrial flutter with rapid ventricular response (HCC)   Suspected COVID-19 virus infection   HLD (hyperlipidemia)   CAD (coronary artery disease)   HTN (hypertension)   Pancreatic insufficiency   DM2 (diabetes mellitus, type 2) (HCC)   Multifocal pneumonia   Acute HFrEF (heart failure with reduced ejection fraction) (HCC)   Diagnostic Studies/Procedures    Echocardiogram: 05/08/2020 IMPRESSIONS    1. Left ventricular ejection fraction, by estimation, is <20%. The left  ventricle has severely decreased function. The left ventricle demonstrates  global hypokinesis. Left ventricular diastolic parameters are  indeterminate.  2. Right ventricular systolic function is moderately reduced. The right  ventricular size is moderately enlarged.  3. Left atrial size was moderately dilated.  4. Right atrial size was mildly dilated.  5. The mitral valve is grossly normal. Trivial mitral valve  regurgitation.  6. The aortic valve was not well visualized. Aortic valve regurgitation  is not visualized.    R/LHC: 05/10/2020 Left Main  No significant disease.  Left Anterior Descending  80% stenosis far distal LAD.  Left Circumflex  Patent stent in OM1. No significant obstructive disease.  Right Coronary Artery  Diffuse luminal irregularities. 60% distal RCA stenosis. 50% proximal stenosis in PLV. 80% stenosis mid/distal PDA.   RHC Procedural Findings (milrinone 0.25): Hemodynamics (mmHg) RA mean 8 RV 44/6 PA 40/23, mean 31 PCWP mean 21 LV 130/30 AO 135/87  Oxygen saturations: PA 70% AO 100%  Cardiac Output  (Fick) 5.61  Cardiac Index (Fick) 2.4  PVR 1.8 WU  1. Filling pressures remain mildly elevated.  2. Good cardiac output on milrinone.  3. Distal vessel coronary disease, no target for intervention.     History of Present Illness     Steven Burnett is a 59 y.o. 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   He was in acute hypoxic respiratory failure while in the ED and saturations improved with nasal cannula supplementation. Was febrile to 100.5. CXR was concerning for PNA and CTA was negative for a PE but showed bilateral airspace opacities consistent with multifocal PNA. Was also found to be in atrial flutter with RVR on admission with IV Cardizem and IV Heparin being initiated. He was also on COVID-19 precautions initially as he had tested negative but his son who is from the home had recently tested positive.    Hospital Course     Consultants: Critical Care; Internal Medicine  Critical Care was consulted due to his worsening respiratory status and he was started on antibiotic therapy due to concerns for aspiration PNA in the setting of him having vomiting episodes. Cardiology was consulted on 05/08/2020 as an echocardiogram was obtained and showed EF < 20% with global HK, moderately reduced RV function and trivial MR. Given his echo results, IV Cardizem was discontinued and transitioned to BB therapy along with IV Amiodarone being initiated and he converted  back to NSR within 48 hours of starting Amiodarone. AHF was consulted on 05/09/2020 and initial co-ox was at 47% with CVP at 15. Was started on IV Milrinone and Lasix was further titrated. He underwent R/LHC on 05/10/2020 with this showing distal vessel disease with 80% distal LAD stenosis  and 80% mid/distal PDA stenosis with patent stent along OM1, therefore his cardiomyopathy was felt to be more tachycardia-mediated. Filling pressures remained elevated but he did have good cardiac output on Milrinone (Cardiac Output (Fick) 5.61, Cardiac Index (Fick) 2.4, PVR 1.8 WU).   He continued to diurese well and Milrinone was discontinued on 10/23. IV Lasix was transitioned to PO Torsemide 20mg  daily. He was last examined by Dr. Shirlee Latch on 05/13/2020 and deemed stable for discharge. He had a recorded output of -5.2L (doubt accuracy) but weight declined by 27 lbs (discharge weight of 269 lbs). Creatinine was stable at 1.22 with co-ox at 57% on most recent check. In regards to his cardiomyopathy, he was started on Digoxin 0.125mg  daily, Entresto 24-26mg  BID, Spironolactone 25mg  daily and [place] 10mg  daily. He did maintain NSR and it was recommended he continue Amiodarone 200mg  BID for 7 days with instructions to reduce to 200mg  daily afterwards. Was also started on Eliquis 5mg  BID for anticoagulation. 30-day cards and co-pay cards were provided for [place], Eliquis and Entresto. Was recommended to refer to EP as an outpatient for atrial flutter. He did experience liver shock but LFT's continued to improve (AST 86, ALT 172 on 10/24) and statin therapy was reinitiated at discharge.   A staff message has been sent to the [facility] to arrange for follow-up in 7-10 days. He was discharged home in good condition.    _____________  Discharge Vitals Blood pressure 114/83, pulse 82, temperature 97.9 F (36.6 C), temperature source Oral, resp. rate 15, height 5\' 8"  (1.727 m), weight 122.3 kg, SpO2 96 %.  Filed Weights   05/11/20 0500 05/12/20 0525 05/13/20 0458  Weight: 126.3 kg 123.8 kg 122.3 kg    Labs & Radiologic Studies    CBC Recent Labs    05/12/20 0315 05/13/20 0506  WBC 4.3 5.8  HGB 13.5 15.2  HCT 41.2 47.2  MCV 91.2 90.9  PLT 291 362   Basic Metabolic  Panel Recent Labs    05/10/20 2021 05/11/20 0216 05/12/20 0315 05/13/20 0506  NA  --    < > 140 141  K 3.6   < > 3.6 3.9  CL  --    < > 102 100  CO2  --    < > 28 30  GLUCOSE  --    < > 110* 107*  BUN  --    < > 17 15  CREATININE  --    < > 1.20 1.22  CALCIUM  --    < > 9.1 9.4  MG 2.1  --   --  2.2   < > = values in this interval not displayed.   Liver Function Tests Recent Labs    05/12/20 0315 05/13/20 0506  AST 120* 86*  ALT 216* 172*  ALKPHOS 27* 28*  BILITOT 0.9 0.8  PROT 6.3* 7.2  ALBUMIN 2.9* 3.2*   No results for input(s): LIPASE, AMYLASE in the last 72 hours. High Sensitivity Troponin:   Recent Labs  Lab 05/04/20 2202 05/05/20 0042 05/05/20 0548 05/05/20 0829 05/05/20 1407  TROPONINIHS 145* 149* 121* 133* 115*    BNP Invalid input(s): POCBNP D-Dimer No results for  input(s): DDIMER in the last 72 hours. Hemoglobin A1C No results for input(s): HGBA1C in the last 72 hours. Fasting Lipid Panel No results for input(s): CHOL, HDL, LDLCALC, TRIG, CHOLHDL, LDLDIRECT in the last 72 hours. Thyroid Function Tests No results for input(s): TSH, T4TOTAL, T3FREE, THYROIDAB in the last 72 hours.  Invalid input(s): FREET3 _____________  CT ABDOMEN PELVIS WO CONTRAST  Result Date: 05/07/2020 CLINICAL DATA:  59 year old male with abdominal pain. Concern for perforation or peritonitis. EXAM: CT ABDOMEN AND PELVIS WITHOUT CONTRAST TECHNIQUE: Multidetector CT imaging of the abdomen and pelvis was performed following the standard protocol without IV contrast. COMPARISON:  None. FINDINGS: Evaluation of this exam is limited in the absence of intravenous contrast. Lower chest: Three-vessel coronary vascular calcification noted. There is diffuse interstitial prominence and hazy airspace opacity in the visualized lung bases which may represent edema or pneumonia. Clinical correlation is recommended. No intra-abdominal free air or free fluid. Hepatobiliary: Diffuse fatty liver.  No intrahepatic biliary dilatation. The gallbladder is unremarkable. Pancreas: Unremarkable. No pancreatic ductal dilatation or surrounding inflammatory changes. Spleen: Normal in size without focal abnormality. Adrenals/Urinary Tract: The adrenal glands unremarkable. There is a punctate nonobstructing left renal interpolar calculus. No hydronephrosis. The right kidney is unremarkable. The visualized ureters and urinary bladder appear unremarkable. Stomach/Bowel: There is sigmoid diverticulosis without active inflammatory changes. There is no bowel obstruction or active inflammation. The appendix is normal. Vascular/Lymphatic: Mild aortoiliac atherosclerotic disease. The IVC is unremarkable. No portal venous gas. There is no adenopathy. Reproductive: The prostate and seminal vesicles are grossly unremarkable. Other: Midline vertical anterior abdominal wall incisional scar. Musculoskeletal: No acute or significant osseous findings. Degenerative changes. IMPRESSION: 1. Bilateral lower lung field pulmonary opacities may represent edema or pneumonia. Clinical correlation is recommended. 2. No acute intra-abdominal or pelvic pathology. No bowel obstruction. Normal appendix. 3. Fatty liver. 4. Punctate nonobstructing left renal interpolar calculus. No hydronephrosis. 5. Aortic Atherosclerosis (ICD10-I70.0). Electronically Signed   By: Elgie Collard M.D.   On: 05/07/2020 22:45   CT ANGIO CHEST PE W OR WO CONTRAST  Result Date: 05/05/2020 CLINICAL DATA:  Chest pain and shortness of breath. EXAM: CT ANGIOGRAPHY CHEST WITH CONTRAST TECHNIQUE: Multidetector CT imaging of the chest was performed using the standard protocol during bolus administration of intravenous contrast. Multiplanar CT image reconstructions and MIPs were obtained to evaluate the vascular anatomy. CONTRAST:  76mL OMNIPAQUE IOHEXOL 350 MG/ML SOLN COMPARISON:  Chest radiograph dated 05/04/2020. FINDINGS: Cardiovascular: There is satisfactory  opacification of the pulmonary arteries to the segmental level. There is no evidence of a pulmonary embolism. Heart is normal in size. No pericardial effusion. Three-vessel coronary artery calcifications. Great vessels are normal in caliber. Mediastinum/Nodes: Shotty right paratracheal adenopathy is noted, largest node measuring approximately 1.6 cm in short axis left paratracheal superior mediastinal node measures 1.7 cm in short axis no mediastinal or hilar masses. Trachea and esophagus are unremarkable. Lungs/Pleura: There are bilateral patchy ground-glass and more confluent airspace opacities, greater on the right. Interstitial thickening is noted in the lower lungs. Trace right pleural effusion. No pneumothorax. Upper Abdomen: No acute findings. Decreased attenuation of the liver consistent with fatty infiltration. Tiny nonobstructing stone in the visualized left kidney. Musculoskeletal: No fracture or acute finding. No osteoblastic or osteolytic lesions. Review of the MIP images confirms the above findings. IMPRESSION: 1. No evidence of a pulmonary embolism. 2. Right greater than left bilateral airspace lung opacities, both ground-glass and more confluent, associated with lower lung zone interstitial thickening and a trace right  pleural effusion. There is also mild shoddy right paratracheal and left superior mediastinal adenopathy. Findings consistent with multifocal pneumonia. Consider asymmetric pulmonary edema as the etiology or a component if symptoms are atypical for pneumonia. Electronically Signed   By: Amie Portland M.D.   On: 05/05/2020 12:45   DG CHEST PORT 1 VIEW  Result Date: 05/09/2020 CLINICAL DATA:  Central line placement EXAM: PORTABLE CHEST 1 VIEW COMPARISON:  05/09/2020 FINDINGS: Right internal jugular central venous catheter has been placed with its tip overlying the superior vena cava. Multifocal pulmonary infiltrate, more severe within the right upper lobe, is likely stable when  accounting for changes in radiographic technique. No pneumothorax or pleural effusion. Mild cardiomegaly is stable. No acute bone abnormality. IMPRESSION: Right internal jugular central venous catheter tip within the superior vena cava. No pneumothorax. Stable pulmonary infiltrates. Electronically Signed   By: Helyn Numbers MD   On: 05/09/2020 14:17   DG CHEST PORT 1 VIEW  Result Date: 05/09/2020 CLINICAL DATA:  Community acquired pneumonia EXAM: PORTABLE CHEST 1 VIEW COMPARISON:  Two days ago FINDINGS: Diffuse interstitial prominence that appears increased. There is asymmetric hazy right chest density correlating with airspace disease on chest CT from 4 days ago. Cardiopericardial enlargement. No visible effusion or air leak IMPRESSION: 1. Mildly improved airspace disease. 2. Increased interstitial prominence which may be interval vascular congestion. Electronically Signed   By: Marnee Spring M.D.   On: 05/09/2020 05:53   DG CHEST PORT 1 VIEW  Result Date: 05/07/2020 CLINICAL DATA:  Acute respiratory failure. EXAM: PORTABLE CHEST 1 VIEW COMPARISON:  05/04/2020 radiography.  CT 05/05/2020. FINDINGS: Worsening of perihilar airspace filling right more than left. Differential diagnosis remains that of asymmetric pneumonia versus is asymmetric pulmonary edema. Infectious pneumonia is favored. The pattern does not appear typical of coronavirus pneumonia by imaging. IMPRESSION: Worsening of perihilar airspace filling right more than left. Ejection and favored over asymmetric edema. Pattern is not typical of coronavirus. Electronically Signed   By: Paulina Fusi M.D.   On: 05/07/2020 09:19   DG Chest Port 1 View  Result Date: 05/04/2020 CLINICAL DATA:  Chest pain, shortness of breath. Patient max needed but living with sign waist COVID positive. EXAM: PORTABLE CHEST 1 VIEW COMPARISON:  Chest x-ray 10/24/2008 report without images FINDINGS: The heart size and mediastinal contours are within normal limits  with mildly prominent cardiac silhouette likely due to AP portable technique. Increased interstitial markings and airspace opacities of the mid to lower lung zones. No pleural effusion. No pneumothorax. No acute osseous abnormality. IMPRESSION: Findings suggestive of atypical/viral pneumonia. COVID-19 infection not excluded. Electronically Signed   By: Tish Frederickson M.D.   On: 05/04/2020 20:47   US Abdomen Limited RUQ (LIVER/GB)  Result Date: 05/08/2020 CLINICAL DATA:  Elevated LFT EXAM: ULTRASOUND ABDOMEN LIMITED RIGHT UPPER QUADRANT COMPARISON:  CT 05/07/2020 FINDINGS: Gallbladder: No gallstones or wall thickening visualized. No sonographic Murphy sign noted by sonographer. Common bile duct: Diameter: Unable to visualize due to bowel gas and habitus. Liver: Diffusely echogenic. Left lobe not well evaluated. No definite focal hepatic abnormality. Portal vein is patent on color Doppler imaging with normal direction of blood flow towards the liver. Other: None. IMPRESSION: 1. Diffusely echogenic liver consistent with hepatic steatosis and or hepatocellular disease. 2. Unable to visualize common bile duct due to habitus and gas. Electronically Signed   By: Jasmine Pang M.D.   On: 05/08/2020 16:40   Disposition   Pt is being discharged home today in good  condition.  Follow-up Plans & Appointments     Follow-up Information    Laurey Morale, MD Follow up.   Specialty: Cardiology Why: The office should contact you within 1-2 business days to arrange follow-up. If you do not hear from them within this timeframe, please call the number provided.  Contact information: [address] [place] [address] [phone]              Discharge Instructions    Diet - low sodium heart healthy   Complete by: As directed    Discharge instructions   Complete by: As directed    PLEASE REMEMBER TO BRING ALL OF YOUR MEDICATIONS TO EACH OF YOUR FOLLOW-UP OFFICE VISITS.  PLEASE ATTEND ALL  SCHEDULED FOLLOW-UP APPOINTMENTS.   Activity: Increase activity slowly as tolerated. You may shower, but no soaking baths (or swimming) for 1 week. No lifting over 5 lbs for 1 week. No sexual activity for 1 week.    Wound Care: You may wash cath site gently with soap and water. Keep cath site clean and dry. If you notice pain, swelling, bleeding or pus at your cath site, please call [phone].   Increase activity slowly   Complete by: As directed    Remove dressing in 24 hours   Complete by: As directed       Discharge Medications   Allergies as of 05/13/2020   No Known Allergies     Medication List    STOP taking these medications   aspirin EC 81 MG tablet   carvedilol 6.25 MG tablet Commonly known as: COREG   clopidogrel 75 MG tablet Commonly known as: PLAVIX   Flax Oil   ibuprofen 200 MG tablet Commonly known as: ADVIL   losartan 100 MG tablet Commonly known as: COZAAR   metFORMIN 500 MG tablet Commonly known as: GLUCOPHAGE   simvastatin 40 MG tablet Commonly known as: ZOCOR     TAKE these medications   acetaminophen 325 MG tablet Commonly known as: TYLENOL Take 325-650 mg by mouth every 6 (six) hours as needed for mild pain or headache.   allopurinol 300 MG tablet Commonly known as: ZYLOPRIM Take 300 mg by mouth 2 (two) times daily.   ALPRAZolam 1 MG tablet Commonly known as: XANAX Take 1 mg by mouth 3 (three) times daily. Notes to patient: For your anxiety   amiodarone 200 MG tablet Commonly known as: PACERONE Take 1 tablet (200mg ) by mouth twice daily for 7 days then reduce to 1 tablet (200mg ) once daily.   apixaban 5 MG Tabs tablet Commonly known as: ELIQUIS Take 1 tablet (5 mg total) by mouth 2 (two) times daily.   atorvastatin 80 MG tablet Commonly known as: LIPITOR Take 1 tablet (80 mg total) by mouth daily. Start taking on: May 14, 2020   Creon 24000-76000 units Cpep Generic drug: Pancrelipase (Lip-Prot-Amyl) Take 1 capsule  by mouth 3 (three) times daily before meals.   dapagliflozin propanediol 10 MG Tabs tablet Commonly known as: FARXIGA Take 1 tablet (10 mg total) by mouth daily. Start taking on: May 14, 2020   digoxin 0.125 MG tablet Commonly known as: LANOXIN Take 1 tablet (0.125 mg total) by mouth daily. Start taking on: May 14, 2020   fenofibrate 145 MG tablet Commonly known as: TRICOR Take 145 mg by mouth daily.   pantoprazole 40 MG tablet Commonly known as: PROTONIX Take 40 mg by mouth daily.   potassium chloride SA 20 MEQ tablet Commonly known as:  KLOR-CON Take 1 tablet (20 mEq total) by mouth daily. Start taking on: May 14, 2020   sacubitril-valsartan 24-26 MG Commonly known as: ENTRESTO Take 1 tablet by mouth 2 (two) times daily.   sertraline 100 MG tablet Commonly known as: ZOLOFT Take 50 mg by mouth in the morning and at bedtime.   silver sulfADIAZINE 1 % cream Commonly known as: SILVADENE Apply 1 application topically See admin instructions. Apply to affected areas of the right leg 2 times a day- apply a clean dressing afterwards   spironolactone 25 MG tablet Commonly known as: ALDACTONE Take 1 tablet (25 mg total) by mouth daily. Start taking on: May 14, 2020   torsemide 20 MG tablet Commonly known as: DEMADEX Take 1 tablet (20 mg total) by mouth daily. Start taking on: May 14, 2020          Outstanding Labs/Studies   BMET at follow-up; FLP and LFT's in 6-8 weeks.   Duration of Discharge Encounter   Greater than 30 minutes including physician time.  Signed, Ellsworth Lennox, PA-C 05/13/2020, 12:45 PM

## 2020-05-15 ENCOUNTER — Telehealth (HOSPITAL_COMMUNITY): Payer: Self-pay | Admitting: Vascular Surgery

## 2020-05-15 NOTE — Telephone Encounter (Signed)
Left pt message giving hosp f/u 11/4 @11  am w/ [facility], asked pt to call back to confirm

## 2020-05-24 ENCOUNTER — Encounter (HOSPITAL_COMMUNITY): Payer: BC Managed Care – PPO | Admitting: Cardiology

## 2020-06-04 ENCOUNTER — Ambulatory Visit (HOSPITAL_COMMUNITY)
Admission: RE | Admit: 2020-06-04 | Discharge: 2020-06-04 | Disposition: A | Discharge status: Home / Self Care | Payer: BC Managed Care – PPO | Source: Ambulatory Visit | Attending: Adult Health | Admitting: Adult Health

## 2020-06-04 ENCOUNTER — Encounter (HOSPITAL_COMMUNITY): Payer: Self-pay

## 2020-06-04 ENCOUNTER — Telehealth (HOSPITAL_COMMUNITY): Payer: Self-pay | Admitting: Adult Health

## 2020-06-04 ENCOUNTER — Other Ambulatory Visit: Payer: Self-pay

## 2020-06-04 VITALS — BP 122/88 | HR 84 | Wt 271.0 lb

## 2020-06-04 DIAGNOSIS — Z7901 Long term (current) use of anticoagulants: Secondary | ICD-10-CM | POA: Insufficient documentation

## 2020-06-04 DIAGNOSIS — I2510 Atherosclerotic heart disease of native coronary artery without angina pectoris: Secondary | ICD-10-CM | POA: Insufficient documentation

## 2020-06-04 DIAGNOSIS — Z9989 Dependence on other enabling machines and devices: Secondary | ICD-10-CM

## 2020-06-04 DIAGNOSIS — I480 Paroxysmal atrial fibrillation: Secondary | ICD-10-CM

## 2020-06-04 DIAGNOSIS — Z79899 Other long term (current) drug therapy: Secondary | ICD-10-CM | POA: Diagnosis not present

## 2020-06-04 DIAGNOSIS — I4892 Unspecified atrial flutter: Secondary | ICD-10-CM | POA: Diagnosis not present

## 2020-06-04 DIAGNOSIS — I252 Old myocardial infarction: Secondary | ICD-10-CM | POA: Insufficient documentation

## 2020-06-04 DIAGNOSIS — E785 Hyperlipidemia, unspecified: Secondary | ICD-10-CM | POA: Diagnosis not present

## 2020-06-04 DIAGNOSIS — E119 Type 2 diabetes mellitus without complications: Secondary | ICD-10-CM | POA: Diagnosis not present

## 2020-06-04 DIAGNOSIS — R7989 Other specified abnormal findings of blood chemistry: Secondary | ICD-10-CM | POA: Insufficient documentation

## 2020-06-04 DIAGNOSIS — G4733 Obstructive sleep apnea (adult) (pediatric): Secondary | ICD-10-CM | POA: Diagnosis not present

## 2020-06-04 DIAGNOSIS — I5022 Chronic systolic (congestive) heart failure: Secondary | ICD-10-CM | POA: Insufficient documentation

## 2020-06-04 DIAGNOSIS — Z87891 Personal history of nicotine dependence: Secondary | ICD-10-CM | POA: Insufficient documentation

## 2020-06-04 DIAGNOSIS — Z6841 Body Mass Index (BMI) 40.0 and over, adult: Secondary | ICD-10-CM | POA: Diagnosis not present

## 2020-06-04 DIAGNOSIS — I110 Hypertensive heart disease with heart failure: Secondary | ICD-10-CM | POA: Insufficient documentation

## 2020-06-04 DIAGNOSIS — Z7984 Long term (current) use of oral hypoglycemic drugs: Secondary | ICD-10-CM | POA: Insufficient documentation

## 2020-06-04 DIAGNOSIS — K7200 Acute and subacute hepatic failure without coma: Secondary | ICD-10-CM | POA: Diagnosis not present

## 2020-06-04 DIAGNOSIS — Z86718 Personal history of other venous thrombosis and embolism: Secondary | ICD-10-CM | POA: Diagnosis not present

## 2020-06-04 DIAGNOSIS — E669 Obesity, unspecified: Secondary | ICD-10-CM | POA: Diagnosis not present

## 2020-06-04 LAB — COMPREHENSIVE METABOLIC PANEL
ALT: 54 U/L — ABNORMAL HIGH (ref 0–44)
AST: 52 U/L — ABNORMAL HIGH (ref 15–41)
Albumin: 3.9 g/dL (ref 3.5–5.0)
Alkaline Phosphatase: 30 U/L — ABNORMAL LOW (ref 38–126)
Anion gap: 11 (ref 5–15)
BUN: 20 mg/dL (ref 6–20)
CO2: 27 mmol/L (ref 22–32)
Calcium: 9.6 mg/dL (ref 8.9–10.3)
Chloride: 102 mmol/L (ref 98–111)
Creatinine, Ser: 1.75 mg/dL — ABNORMAL HIGH (ref 0.61–1.24)
GFR, Estimated: 45 mL/min — ABNORMAL LOW (ref >60)
Glucose, Bld: 123 mg/dL — ABNORMAL HIGH (ref 70–99)
Potassium: 4.2 mmol/L (ref 3.5–5.1)
Sodium: 140 mmol/L (ref 135–145)
Total Bilirubin: 0.7 mg/dL (ref 0.3–1.2)
Total Protein: 6.6 g/dL (ref 6.5–8.1)

## 2020-06-04 LAB — CBC
HCT: 45.2 % (ref 39.0–52.0)
Hemoglobin: 14.5 g/dL (ref 13.0–17.0)
MCH: 29 pg (ref 26.0–34.0)
MCHC: 32.1 g/dL (ref 30.0–36.0)
MCV: 90.4 fL (ref 80.0–100.0)
Platelets: 171 10*3/uL (ref 150–400)
RBC: 5 MIL/uL (ref 4.22–5.81)
RDW: 12.1 % (ref 11.5–15.5)
WBC: 5.3 10*3/uL (ref 4.0–10.5)
nRBC: 0 % (ref 0.0–0.2)

## 2020-06-04 LAB — DIGOXIN LEVEL: Digoxin Level: 1.6 ng/mL (ref 1.0–2.0)

## 2020-06-04 MED ORDER — CARVEDILOL 3.125 MG PO TABS: 3.1250 mg | ORAL_TABLET | Freq: Two times a day (BID) | ORAL | 3 refills | Status: DC

## 2020-06-04 MED ORDER — TORSEMIDE 20 MG PO TABS: 10.0000 mg | ORAL_TABLET | Freq: Every day | ORAL | 2 refills | Status: DC

## 2020-06-04 NOTE — Telephone Encounter (Signed)
  I called Mr Shawgo with the results.  Renal funcyion and digoxin elevated.   1 Hold torsemide x2 days then he will start 10 mg torsemide daily on THursday.    2 Stop potassium  3 Stop digoxin.  4. Check BMET next week  The office will call him tomorrow regarding repeat lab work. He would like to set up in [place].   Jalyne Brodzinski NP-C  7:17 PM

## 2020-06-04 NOTE — Progress Notes (Signed)
PCP: Primary Cardiologist: Dr Shirlee Latch   HPI: [name] is a 59 y/o obese male, former smoker, with h/o CAD w/ remote MI and coronary stenting (done at outside facility, details unknown), OSA w/ reported compliance w/ CPAP, h/o provoked DVT following knee surgery, T2DM, HTN, HLD, PAF, and chronic systolic heart failure.   Admitted 05/13/20 for acute hypoxic respiratory failure 2/2 acute CHF>>cardiogenic shock, AFL w/ RVR and ? PNA. Prior to admit he was drinking 48 ounces of alcohol 3-4 days a week. ECHO was < 20%. Briefly on milrinone but gradaully weaned off.Placed on amio drip and chemically converted to NSR. Started GDMT with plan to restart carvedilol as an outpatient.  Had cath that showed gogood cardiac output and distal coronary disease. No targets for intervention. Discharge weight 269 pounds.   Today he returns for HF follow up.Overall feeling fine. Says he has been anxious about his diagnosis. Denies SOB/PND/Orthopnea. No chest pain. No bleeding issues.  Appetite ok. No fever or chills. Weight at home 265-267 pounds.  Taking all medications. 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   ECHO 05/08/2020 EF < 20% RV moderately reduced.   RHC/LHC  Dominance: Right Left Main  No significant disease.  Left Anterior Descending  80% stenosis far distal LAD.  Left Circumflex  Patent stent in OM1. No significant obstructive disease.  Right Coronary Artery  Diffuse luminal irregularities. 60% distal RCA stenosis. 50% proximal stenosis in PLV. 80% stenosis mid/distal PDA.  Intervention  No interventions have been documented. Right Heart  Right Heart Pressures RHC Procedural Findings (milrinone 0.25): Hemodynamics (mmHg) RA mean 8 RV 44/6 PA 40/23, mean 31 PCWP mean 21 LV 130/30 AO 135/87  Oxygen saturations: PA 70% AO 100%  Cardiac Output (Fick) 5.61  Cardiac Index (Fick) 2.4  PVR 1.8 WU     ROS: All systems negative except as listed in HPI, PMH and Problem  List.  SH:  Social History   Socioeconomic History  . Marital status: Married    Spouse name: Not on file  . Number of children: Not on file  . Years of education: Not on file  . Highest education level: Not on file  Occupational History  . Not on file  Tobacco Use  . Smoking status: Former Games developer  . Smokeless tobacco: Never Used  . Tobacco comment: Quit in 2014  Substance and Sexual Activity  . Alcohol use: Not Currently    Comment: Former, quit in 2014  . Drug use: Never  . Sexual activity: Not on file  Other Topics Concern  . Not on file  Social History Narrative  . Not on file   Social Determinants of Health   Financial Resource Strain:   . Difficulty of Paying Living Expenses: Not on file  Food Insecurity:   . Worried About Programme researcher, broadcasting/film/video in the Last Year: Not on file  . Ran Out of Food in the Last Year: Not on file  Transportation Needs:   . Lack of Transportation (Medical): Not on file  . Lack of Transportation (Non-Medical): Not on file  Physical Activity:   . Days of Exercise per Week: Not on file  . Minutes of Exercise per Session: Not on file  Stress:   . Feeling of Stress : Not on file  Social Connections:   . Frequency of Communication with Friends and Family: Not on file  . Frequency of Social Gatherings with Friends and Family: Not on file  . Attends  Religious Services: Not on file  . Active Member of Clubs or Organizations: Not on file  . Attends Banker Meetings: Not on file  . Marital Status: Not on file  Intimate Partner Violence:   . Fear of Current or Ex-Partner: Not on file  . Emotionally Abused: Not on file  . Physically Abused: Not on file  . Sexually Abused: Not on file    FH:  Family History  Problem Relation Age of Onset  . Kidney failure Son        s/p transplant  . Stroke Mother     Past Medical History:  Diagnosis Date  . CAD (coronary artery disease)    a. s/p prior MI in 2014 with details not  available. b. 04/2020: cath showing distal LAD and distal PDA stenosis with patent stent along OM1 and med management of cardiomyopathy recommended.   . Cardiogenic shock (HCC)    a. 04/2020: EF < 20% and possibly tachycardia-mediated in the setting of atrial flutter with RVR  . DM2 (diabetes mellitus, type 2) (HCC)   . DVT (deep venous thrombosis) (HCC)    Provoked by knee surgery  . HLD (hyperlipidemia)   . HTN (hypertension)   . MI (myocardial infarction) (HCC) 2014   3 stents  . OSA (obstructive sleep apnea)   . Pancreatitis     Current Outpatient Medications  Medication Sig Dispense Refill  . acetaminophen (TYLENOL) 325 MG tablet Take 325-650 mg by mouth every 6 (six) hours as needed for mild pain or headache.    . allopurinol (ZYLOPRIM) 300 MG tablet Take 300 mg by mouth 2 (two) times daily.    Marland Kitchen ALPRAZolam (XANAX) 1 MG tablet Take 1 mg by mouth 3 (three) times daily.     Marland Kitchen amiodarone (PACERONE) 200 MG tablet Take 200 mg by mouth daily.    Marland Kitchen apixaban (ELIQUIS) 5 MG TABS tablet Take 1 tablet (5 mg total) by mouth 2 (two) times daily. 180 tablet 1  . atorvastatin (LIPITOR) 80 MG tablet Take 1 tablet (80 mg total) by mouth daily. 90 tablet 1  . CREON 24000-76000 units CPEP Take 1 capsule by mouth 3 (three) times daily before meals.     . dapagliflozin propanediol (FARXIGA) 10 MG TABS tablet Take 1 tablet (10 mg total) by mouth daily. 90 tablet 1  . digoxin (LANOXIN) 0.125 MG tablet Take 1 tablet (0.125 mg total) by mouth daily. 90 tablet 1  . fenofibrate (TRICOR) 145 MG tablet Take 145 mg by mouth daily.     . pantoprazole (PROTONIX) 40 MG tablet Take 40 mg by mouth daily.    . potassium chloride SA (KLOR-CON) 20 MEQ tablet Take 1 tablet (20 mEq total) by mouth daily. 90 tablet 1  . sacubitril-valsartan (ENTRESTO) 24-26 MG Take 1 tablet by mouth 2 (two) times daily. 180 tablet 1  . sertraline (ZOLOFT) 100 MG tablet Take 50 mg by mouth in the morning and at bedtime.     . silver  sulfADIAZINE (SILVADENE) 1 % cream Apply 1 application topically See admin instructions. Apply to affected areas of the right leg 2 times a day- apply a clean dressing afterwards    . spironolactone (ALDACTONE) 25 MG tablet Take 1 tablet (25 mg total) by mouth daily. 90 tablet 1  . torsemide (DEMADEX) 20 MG tablet Take 1 tablet (20 mg total) by mouth daily. 90 tablet 1   No current facility-administered medications for this encounter.    Vitals:  06/04/20 1552  BP: 122/88  Pulse: 84  SpO2: 97%  Weight: 122.9 kg (271 lb)   Wt Readings from Last 3 Encounters:  06/04/20 122.9 kg (271 lb)  05/13/20 122.3 kg (269 lb 10 oz)    PHYSICAL EXAM: General:  Well appearing. No resp difficulty HEENT: normal Neck: supple. JVP flat. Carotids 2+ bilaterally; no bruits. No lymphadenopathy or thryomegaly appreciated. Cor: PMI normal. Regular rate & rhythm. No rubs, gallops or murmurs. Lungs: clear Abdomen: soft, nontender, nondistended. No hepatosplenomegaly. No bruits or masses. Good bowel sounds. Extremities: no cyanosis, clubbing, rash, edema Neuro: alert & orientedx3, cranial nerves grossly intact. Moves all 4 extremities w/o difficulty. Affect pleasant.   ECG: SR 78 bpm QRS 122 ms    ASSESSMENT & PLAN: 1. Chronic Systolic Heart Failure.  H/O Acute systolic CHF/cardiogenic shock: Echo 04/2020 EF <20%, moderate RV dysfunction. He was in atrial flutter with RVR at admission, not sure how long this was present prior.  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 Coronary angiography showed primarily distal vessel CAD, no intervention.   NYHA II. Volume status stable.  Continue torsemide 20 mg daily.  - Add coreg 3.125 mg twice a day.  - Continue digoxin 0.125 daily.  - Continue Entresto 24-26 mg twice a day.  - Continue spironolactone 25 mg daily. - Continue Farxiga 10 mg daily.     - Check BMET and dig level.  - Plan to repeat ECHO in 3 months after HF meds optimized.  2. Atrial flutter with  RVR: Not reported in the past. Admitted in atrial flutter with RVR on recent admit.  . As above, ?tachy-mediated CMP. Chrmically converted to NSR with IV amio.   - Continue 200 mg po daily.   - Continue eliquis 5 mg twice a day.  - Will need eventual flutter ablation, will refer to EP as outpatient.  - Refer to EP next visit. Check CBC  3. CAD: Remote history of CAD/PCI apparently in [place]. No chest pain. HS-TnI mildly elevated, suspect demand ischemia with CHF/cardiogenic shock. Coronary angiography this admission with distal coronary disease, no target for intervention.  - Continue statin. No chest pain.  - Check LFTs today.  4. H/o DVT: Prior, provoked by surgery. On eliquis.  6. H/O AKI : Creatinine up to 1.8, down to 1.2. Suspect cardiorenal syndrome. Check renal function.  7. Elevated LFTs: Had shock liver.  8. OSA: Using CPAP at night.  9. Obesity: Body mass index is 41.21 kg/m. Discussed portion control. Asked him to start walking everyday.    Discussed medication changes and plan for next 3 months. Check CMET, CBC, dig level.   Follow up in 3 weeks with pharmacy  Follow up in 6 weeks with APP Follow up in 12 weeks with Dr Shirlee Latch and an ECHO  Lovette Merta NP-C  4:46 PM

## 2020-06-04 NOTE — Patient Instructions (Signed)
START Coreg 3.125 mg(1 tab) 2 times a day  Lab work drawn today,we will call you will any abnormal results  Please follow up with our heart failure pharmacist in  3 weeks  Your physician recommends that you schedule a follow-up appointment in:  3 months with an echocardiogram  If you have any questions or concerns before your next appointment please send [place] a message through [place] or call our office at [phone].    TO LEAVE A MESSAGE FOR THE NURSE SELECT OPTION 2, PLEASE LEAVE A MESSAGE INCLUDING: . YOUR NAME . DATE OF BIRTH . CALL BACK NUMBER . REASON FOR CALL**this is important as we prioritize the call backs  YOU WILL RECEIVE A CALL BACK THE SAME DAY AS LONG AS YOU CALL BEFORE 4:00 PM   At [facility], you and your health needs are our priority. As part of our continuing mission to provide you with exceptional heart care, we have created designated Provider Care Teams. These Care Teams include your primary Cardiologist (physician) and Advanced Practice Providers (APPs- Physician Assistants and Nurse Practitioners) who all work together to provide you with the care you need, when you need it.   You may see any of the following providers on your designated Care Team at your next follow up: Marland Kitchen Dr Arvilla Meres . Dr Marca Ancona . Tonye Becket, NP . Robbie Lis, PA . Karle Plumber, PharmD   Please be sure to bring in all your medications bottles to every appointment.

## 2020-06-06 ENCOUNTER — Telehealth: Payer: Self-pay | Admitting: Medical

## 2020-06-06 ENCOUNTER — Telehealth (HOSPITAL_COMMUNITY): Payer: Self-pay | Admitting: Cardiology

## 2020-06-06 DIAGNOSIS — I5022 Chronic systolic (congestive) heart failure: Secondary | ICD-10-CM

## 2020-06-06 NOTE — Telephone Encounter (Signed)
Pt aware reports he will have labs repeated in [place]

## 2020-06-06 NOTE — Telephone Encounter (Signed)
   Patient called the after hours line with concerns regarding his blood pressure and heart rate. He reports BP 111/64 and HR 54. He has no complaints of dizziness, lightheadedness, or syncope. Suspect he has increased anxiety with recent hospitalization and diagnosis of CHF. Reassured patient that these readings are stable and discussed indication to call back if changes in symptoms/vitals. We reviewed recent medication changes (discontinued digoxin and potassium, and plans to hold torsemide x2 days and restart at a lower dose) per Tonye Becket, NP after post-hospital blood work revealed Cr 1.7. He is planning for repeat blood work in 1 week. He was appreciative of the call and in agreement with the plan of watchful waiting.   Beatriz Stallion, PA-C 06/06/20; 6:59 PM

## 2020-06-06 NOTE — Telephone Encounter (Signed)
-----   Message from Sherald Hess, NP sent at 06/04/2020  7:12 PM EST ----- I called him. Instructed to stop digoxin . Dig level elevated. Renal function elevated. Instruct to hold torsemide x 2 days then torsemide start  torsemide 10 mg daily. Stop potassium. Repeat BMET in 7 days. Hgb and WBC stable.  Repeat BMET in 7 days.

## 2020-06-18 NOTE — Progress Notes (Signed)
PCP: Primary Cardiologist: Dr Shirlee Latch   HPI:  Steven Burnett is a 59 y/o obese male, former smoker, with h/o CAD w/ remote MI and coronary stenting (done at outside facility, details unknown), OSA w/ reported compliance w/ CPAP, h/o provoked DVT following knee surgery, T2DM, HTN, HLD, PAF, and chronic systolic heart failure.   ECHO 05/08/2020 EF < 20% RV moderately reduced.   Admitted 05/13/20 for acute hypoxic respiratory failure 2/2 acute CHF>>cardiogenic shock, AFL w/ RVR and ? PNA. Prior to admit he was drinking 48 ounces of alcohol 3-4 days a week. ECHO was < 20%. Briefly on milrinone but gradaully weaned off. Placed on amiodarone drip and chemically converted to NSR. Started GDMT with plan to restart carvedilol as an outpatient.  Had cath that showed good cardiac output and distal coronary disease. No targets for intervention. Discharge weight 269 pounds.   Recently returned to HF Clinic for follow up on 06/04/20. Overall was feeling fine. Stated he had been anxious about his diagnosis. Denied SOB/PND/Orthopnea. No chest pain. No bleeding issues.  Appetite was ok. No fever or chills. Weight at home was 265-267 pounds. 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    Today he returns to HF clinic for pharmacist medication titration. At last visit with [facility], carvedilol 3.125 mg BID was initiated. Additionally, his labs showed a Scr bump from 1.22 to 1.75, so torsemide was held for 2 days and then restarted at 10 mg daily. His digoxin and potassium supplements were discontinued. Unfortunately, Scr remained elevated at 1.74 on repeat lab draw 1 week later (personally reviewed in clinic). Overall he is feeling well today. No dizziness, lightheadedness, chest pain or palpitations. No SOB/DOE. Has lost 5 lbs per his home scale over the last 2-3 weeks. Taking torsemide 10 mg daily. No LEE, PND or orthopnea.   HF Medications: Carvedilol 3.125 mg BID Entresto 24/26 mg  BID Spironolactone 25 mg daily Dapagliflozin 10 mg daily Torsemide 10 mg daily  Has the patient been experiencing any side effects to the medications prescribed?  no  Does the patient have any problems obtaining medications due to transportation or finances?   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 Patient understands to avoid decongestants.    Pertinent Lab Values (06/16/20): Marland Kitchen Serum creatinine 1.74, BUN 17, Potassium 4.1  Vital Signs: . Weight: 266 lbs (last clinic weight: 271 lbs) . Blood pressure: 126/76 - did not take AM medications.  . Heart rate: 67   Assessment: 1. Chronic Systolic Heart Failure.  H/O Acute systolic CHF/cardiogenic shock: Echo 04/2020 EF <20%, moderate RV dysfunction. He was in atrial flutter with RVR at admission, not sure how long this was present prior. 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 Coronary angiography showed primarily distal vessel CAD, no intervention.   - NYHA II. Euvolemic-dry on exam - Labs 06/16/20: Scr remained elevated at 1.74, K 4.1  - Decrease torsemide to 20 mg PRN only. Repeat BMET in 1 week.   - Continue carvedilol 3.125 mg twice a day.  - Continue Entresto 24-26 mg twice a day.  - Continue spironolactone 25 mg daily. -ContinueFarxiga 10 mg daily.  - Digoxin previously discontinued due to elevated level of 1.6 ng/mL. - Plan to repeat ECHO in 3 months after HF meds optimized.  2. Atrial flutter with RVR: Not reported in the past. Admitted in atrial flutter with RVR on recent admit.  Marland Kitchen  As above, ?tachy-mediated CMP. Chronically converted to NSR with IV amio.   -Continue amiodarone 200 mg po daily.  - Continue eliquis 5 mg twice a day.  - Will need eventual flutter ablation, referred to EP as outpatient.  3. CAD: Remote history of CAD/PCI apparently in [place]. No chest pain. HS-TnI  mildly elevated, suspect demand ischemia with CHF/cardiogenic shock. Coronary angiography this admission with distal coronary disease, no target for intervention.  - Continue statin. No chest pain.  4. H/o DVT: Prior, provoked by surgery. On eliquis.  6. H/O AKI : Creatinine up to 1.8, down to 1.2, then back up to 1.75 on recent labs.  - Decrease torsemide as above 7. Elevated LFTs: Had shock liver.  - LFTs downtrending 8. OSA: Using CPAP at night. 9. Obesity: Body mass index is 41.21 kg/m. Discussed portion control. Asked him to start walking everyday.    Plan: 1) Medication changes: Based on clinical presentation, vital signs and recent labs will decrease torsemide to 20 mg PRN only 2) Labs: repeat BMET in 1 week 3) Follow-up: 4 weeks with [facility] Clinic   Karle Plumber, PharmD, BCPS, BCCP, CPP Heart Failure Clinic Pharmacist [phone]

## 2020-06-29 ENCOUNTER — Telehealth (HOSPITAL_COMMUNITY): Payer: Self-pay

## 2020-06-29 NOTE — Telephone Encounter (Signed)
Patient called wanting to know what kind of medication he could take for a cold. I advised him that he could take plain muccinex or robitussin per HF guidelines.

## 2020-07-02 ENCOUNTER — Other Ambulatory Visit: Payer: Self-pay

## 2020-07-02 ENCOUNTER — Ambulatory Visit (HOSPITAL_COMMUNITY)
Admission: RE | Admit: 2020-07-02 | Discharge: 2020-07-02 | Disposition: A | Discharge status: Home / Self Care | Payer: BC Managed Care – PPO | Source: Ambulatory Visit | Attending: Internal Medicine | Admitting: Internal Medicine

## 2020-07-02 VITALS — BP 126/76 | HR 67 | Wt 266.2 lb

## 2020-07-02 DIAGNOSIS — I2510 Atherosclerotic heart disease of native coronary artery without angina pectoris: Secondary | ICD-10-CM | POA: Insufficient documentation

## 2020-07-02 DIAGNOSIS — Z7901 Long term (current) use of anticoagulants: Secondary | ICD-10-CM | POA: Diagnosis not present

## 2020-07-02 DIAGNOSIS — I5022 Chronic systolic (congestive) heart failure: Secondary | ICD-10-CM | POA: Insufficient documentation

## 2020-07-02 DIAGNOSIS — I110 Hypertensive heart disease with heart failure: Secondary | ICD-10-CM | POA: Insufficient documentation

## 2020-07-02 DIAGNOSIS — E669 Obesity, unspecified: Secondary | ICD-10-CM | POA: Diagnosis not present

## 2020-07-02 DIAGNOSIS — Z86718 Personal history of other venous thrombosis and embolism: Secondary | ICD-10-CM | POA: Diagnosis not present

## 2020-07-02 DIAGNOSIS — G4733 Obstructive sleep apnea (adult) (pediatric): Secondary | ICD-10-CM | POA: Diagnosis not present

## 2020-07-02 DIAGNOSIS — Z5181 Encounter for therapeutic drug level monitoring: Secondary | ICD-10-CM | POA: Diagnosis not present

## 2020-07-02 DIAGNOSIS — I4892 Unspecified atrial flutter: Secondary | ICD-10-CM | POA: Insufficient documentation

## 2020-07-02 DIAGNOSIS — Z6841 Body Mass Index (BMI) 40.0 and over, adult: Secondary | ICD-10-CM | POA: Diagnosis not present

## 2020-07-02 DIAGNOSIS — Z955 Presence of coronary angioplasty implant and graft: Secondary | ICD-10-CM | POA: Insufficient documentation

## 2020-07-02 MED ORDER — TORSEMIDE 20 MG PO TABS
20.0000 mg | ORAL_TABLET | Freq: Every day | ORAL | 2 refills | Status: DC | PRN
Start: 2020-07-02 — End: 2020-09-06

## 2020-07-02 NOTE — Patient Instructions (Addendum)
It was a pleasure seeing you today!  MEDICATIONS: -We are changing your medications today -Decrease torsemide to 20 mg as needed only (for weight gain of 3 lbs in 1 day or 5 lbs in 1 week) -Call if you have questions about your medications.  LABS: -Repeat labs in [place] in 1 week  NEXT APPOINTMENT: Return to clinic in 4 weeks with APP.  In general, to take care of your heart failure: -Limit your fluid intake to 2 Liters (half-gallon) per day.   -Limit your salt intake to ideally 2-3 grams (2000-3000 mg) per day. -Weigh yourself daily and record, and bring that "weight diary" to your next appointment.  (Weight gain of 2-3 pounds in 1 day typically means fluid weight.) -The medications for your heart are to help your heart and help you live longer.   -Please contact us before stopping any of your heart medications.  Call the clinic at [phone] with questions or to reschedule future appointments.

## 2020-07-05 ENCOUNTER — Other Ambulatory Visit (HOSPITAL_COMMUNITY): Payer: Self-pay | Admitting: Cardiology

## 2020-07-18 ENCOUNTER — Telehealth (HOSPITAL_COMMUNITY): Payer: Self-pay | Admitting: *Deleted

## 2020-07-18 NOTE — Telephone Encounter (Signed)
Order for bmet and lipid panel faxed to [facility] ([phone]) per pts request.

## 2020-07-29 NOTE — Progress Notes (Signed)
PCP: Dr Jeronimo Greaves  Primary Cardiologist: Dr Shirlee Latch   HPI: [name] is a 60 y/o obese male, former smoker, with h/o CAD w/ remote MI and coronary stenting (done at outside facility, details unknown), OSA w/ reported compliance w/ CPAP, h/o provoked DVT following knee surgery, T2DM, HTN, HLD, PAF, and chronic systolic heart failure.   Admitted 05/13/20 for acute hypoxic respiratory failure 2/2 acute CHF>>cardiogenic shock, AFL w/ RVR and ? PNA. Prior to admit he was drinking 48 ounces of alcohol 3-4 days a week. ECHO was < 20%. Briefly on milrinone but gradaully weaned off.Placed on amio drip and chemically converted to NSR. Started GDMT with plan to restart carvedilol as an outpatient.  Had cath that showed gogood cardiac output and distal coronary disease. No targets for intervention. Discharge weight 269 pounds.   Today he returns for HF follow up.Overall feeling fine. Rarely short of breath. Denies PND/Orthopnea. Uses CPAP every night. Denies chest pain. Appetite ok. No fever or chills. Weight at home stable. Taking all medications. 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   ECHO 05/08/2020 EF < 20% RV moderately reduced.   RHC/LHC  Dominance: Right Left Main  No significant disease.  Left Anterior Descending  80% stenosis far distal LAD.  Left Circumflex  Patent stent in OM1. No significant obstructive disease.  Right Coronary Artery  Diffuse luminal irregularities. 60% distal RCA stenosis. 50% proximal stenosis in PLV. 80% stenosis mid/distal PDA.  Intervention  No interventions have been documented. Right Heart  Right Heart Pressures RHC Procedural Findings (milrinone 0.25): Hemodynamics (mmHg) RA mean 8 RV 44/6 PA 40/23, mean 31 PCWP mean 21 LV 130/30 AO 135/87  Oxygen saturations: PA 70% AO 100%  Cardiac Output (Fick) 5.61  Cardiac Index (Fick) 2.4  PVR 1.8 WU     ROS: All systems negative except as listed in HPI, PMH and Problem List.  SH:  Social History    Socioeconomic History  . Marital status: Married    Spouse name: Not on file  . Number of children: Not on file  . Years of education: Not on file  . Highest education level: Not on file  Occupational History  . Not on file  Tobacco Use  . Smoking status: Former Games developer  . Smokeless tobacco: Never Used  . Tobacco comment: Quit in 2014  Substance and Sexual Activity  . Alcohol use: Not Currently    Comment: Former, quit in 2014  . Drug use: Never  . Sexual activity: Not on file  Other Topics Concern  . Not on file  Social History Narrative  . Not on file   Social Determinants of Health   Financial Resource Strain: Not on file  Food Insecurity: Not on file  Transportation Needs: Not on file  Physical Activity: Not on file  Stress: Not on file  Social Connections: Not on file  Intimate Partner Violence: Not on file    FH:  Family History  Problem Relation Age of Onset  . Kidney failure Son        s/p transplant  . Stroke Mother     Past Medical History:  Diagnosis Date  . CAD (coronary artery disease)    a. s/p prior MI in 2014 with details not available. b. 04/2020: cath showing distal LAD and distal PDA stenosis with patent stent along OM1 and med management of cardiomyopathy recommended.   . Cardiogenic shock (HCC)    a. 04/2020: EF < 20% and possibly tachycardia-mediated in the setting  of atrial flutter with RVR  . DM2 (diabetes mellitus, type 2) (HCC)   . DVT (deep venous thrombosis) (HCC)    Provoked by knee surgery  . HLD (hyperlipidemia)   . HTN (hypertension)   . MI (myocardial infarction) (HCC) 2014   3 stents  . OSA (obstructive sleep apnea)   . Pancreatitis     Current Outpatient Medications  Medication Sig Dispense Refill  . acetaminophen (TYLENOL) 325 MG tablet Take 325-650 mg by mouth every 6 (six) hours as needed for mild pain or headache.    . allopurinol (ZYLOPRIM) 300 MG tablet Take 300 mg by mouth 2 (two) times daily.    Marland Kitchen ALPRAZolam  (XANAX) 1 MG tablet Take 1 mg by mouth 3 (three) times daily.     Marland Kitchen amiodarone (PACERONE) 200 MG tablet Take 200 mg by mouth daily.    Marland Kitchen apixaban (ELIQUIS) 5 MG TABS tablet Take 1 tablet (5 mg total) by mouth 2 (two) times daily. 180 tablet 1  . atorvastatin (LIPITOR) 80 MG tablet Take 1 tablet (80 mg total) by mouth daily. 90 tablet 1  . carvedilol (COREG) 3.125 MG tablet Take 1 tablet (3.125 mg total) by mouth 2 (two) times daily. 60 tablet 3  . CREON 24000-76000 units CPEP Take 1 capsule by mouth 3 (three) times daily before meals.     . dapagliflozin propanediol (FARXIGA) 10 MG TABS tablet Take 1 tablet (10 mg total) by mouth daily. 90 tablet 1  . fenofibrate (TRICOR) 145 MG tablet Take 145 mg by mouth daily.     . pantoprazole (PROTONIX) 40 MG tablet Take 40 mg by mouth daily.    . sertraline (ZOLOFT) 100 MG tablet Take 50 mg by mouth in the morning and at bedtime.     Marland Kitchen spironolactone (ALDACTONE) 25 MG tablet Take 1 tablet (25 mg total) by mouth daily. 90 tablet 1  . tadalafil (CIALIS) 5 MG tablet Take 5 mg by mouth daily as needed for erectile dysfunction.    . torsemide (DEMADEX) 20 MG tablet Take 1 tablet (20 mg total) by mouth daily as needed. 90 tablet 2   No current facility-administered medications for this encounter.    Vitals:   07/30/20 1514  BP: 100/70  Pulse: (!) 58  SpO2: 96%  Weight: 122.8 kg (270 lb 12.8 oz)   Wt Readings from Last 3 Encounters:  07/30/20 122.8 kg (270 lb 12.8 oz)  07/02/20 120.7 kg (266 lb 3.2 oz)  06/04/20 122.9 kg (271 lb)    PHYSICAL EXAM: General:  Well appearing. No resp difficulty HEENT: normal Neck: supple. no JVD. Carotids 2+ bilat; no bruits. No lymphadenopathy or thryomegaly appreciated. Cor: PMI nondisplaced. Regular rate & rhythm. No rubs, gallops or murmurs. Lungs: clear Abdomen: soft, nontender, nondistended. No hepatosplenomegaly. No bruits or masses. Good bowel sounds. Extremities: no cyanosis, clubbing, rash, edema Neuro:  alert & orientedx3, cranial nerves grossly intact. moves all 4 extremities w/o difficulty. Affect pleasant  EKG: SR 57 bpm personally reviewed.   ASSESSMENT & PLAN: 1. Chronic Systolic Heart Failure.  H/O Acute systolic CHF/cardiogenic shock: Echo 04/2020 EF <20%, moderate RV dysfunction. He was in atrial flutter with RVR at admission, not sure how long this was present prior.  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 Coronary angiography showed primarily distal vessel CAD, no intervention.   NYHA II. Volume status stable. Continue torsemide as needed.  - Continue coreg 3.125 mg twice a day.  - Continue Entresto 24-26 mg twice a day.  -  Continue spironolactone 25 mg daily. - Continue Farxiga 10 mg daily.     - Plan to repeat ECHO next visit. If EF remains low will need referral to EP.   2. Atrial flutter with RVR: Not reported in the past. Admitted in atrial flutter with RVR on recent admit.  . As above, ?tachy-mediated CMP. Chemically converted to NSR with IV amio.   - Maintaining NSR today.  Stop amiodarone.    - Continue eliquis 5 mg twice a day.  - Will need eventual flutter ablation, will refer to EP as outpatient.  - Refer to EP next visit.  3. CAD: Remote history of CAD/PCI apparently in [place]. No chest pain. HS-TnI mildly elevated, suspect demand ischemia with CHF/cardiogenic shock. Coronary angiography this admission with distal coronary disease, no target for intervention.  - Continue statin. No chest pain.  4. H/o DVT: Prior, provoked by surgery. On eliquis.  6. CKD Stage III: Creatinine up to 1.8, down to 1.42. Suspect cardiorenal syndrome. Had recent labs at PCP. Will request copy.   7. Elevated LFTs: Had shock liver.  8. OSA: Continue CPAP every night.   9. Obesity: Body mass index is 41.17 kg/m. Discussed portion control. Asked him to start walking everyday.   Follow up in 4 weeks with Dr Shirlee Latch and an ECHO Greater than 50% of the (total minutes 25) visit  spent in counseling/coordination of care regarding the above plan to include medications changes as well as what guideline directed medical therapy means for heart failure patients.   Chella Chapdelaine NP-C  3:24 PM

## 2020-07-30 ENCOUNTER — Ambulatory Visit (HOSPITAL_COMMUNITY)
Admission: RE | Admit: 2020-07-30 | Discharge: 2020-07-30 | Disposition: A | Discharge status: Home / Self Care | Payer: BC Managed Care – PPO | Source: Ambulatory Visit | Attending: Cardiology | Admitting: Cardiology

## 2020-07-30 ENCOUNTER — Other Ambulatory Visit: Payer: Self-pay

## 2020-07-30 ENCOUNTER — Telehealth (HOSPITAL_COMMUNITY): Payer: Self-pay | Admitting: Cardiology

## 2020-07-30 ENCOUNTER — Encounter (HOSPITAL_COMMUNITY): Payer: Self-pay

## 2020-07-30 VITALS — BP 100/70 | HR 58 | Wt 270.8 lb

## 2020-07-30 DIAGNOSIS — I252 Old myocardial infarction: Secondary | ICD-10-CM | POA: Diagnosis not present

## 2020-07-30 DIAGNOSIS — N183 Chronic kidney disease, stage 3 unspecified: Secondary | ICD-10-CM | POA: Insufficient documentation

## 2020-07-30 DIAGNOSIS — Z79899 Other long term (current) drug therapy: Secondary | ICD-10-CM | POA: Insufficient documentation

## 2020-07-30 DIAGNOSIS — I4892 Unspecified atrial flutter: Secondary | ICD-10-CM | POA: Insufficient documentation

## 2020-07-30 DIAGNOSIS — Z7901 Long term (current) use of anticoagulants: Secondary | ICD-10-CM | POA: Insufficient documentation

## 2020-07-30 DIAGNOSIS — I480 Paroxysmal atrial fibrillation: Secondary | ICD-10-CM | POA: Diagnosis not present

## 2020-07-30 DIAGNOSIS — Z87891 Personal history of nicotine dependence: Secondary | ICD-10-CM | POA: Insufficient documentation

## 2020-07-30 DIAGNOSIS — E1122 Type 2 diabetes mellitus with diabetic chronic kidney disease: Secondary | ICD-10-CM | POA: Insufficient documentation

## 2020-07-30 DIAGNOSIS — I130 Hypertensive heart and chronic kidney disease with heart failure and stage 1 through stage 4 chronic kidney disease, or unspecified chronic kidney disease: Secondary | ICD-10-CM | POA: Diagnosis not present

## 2020-07-30 DIAGNOSIS — Z6841 Body Mass Index (BMI) 40.0 and over, adult: Secondary | ICD-10-CM | POA: Insufficient documentation

## 2020-07-30 DIAGNOSIS — I5022 Chronic systolic (congestive) heart failure: Secondary | ICD-10-CM | POA: Diagnosis not present

## 2020-07-30 DIAGNOSIS — Z86718 Personal history of other venous thrombosis and embolism: Secondary | ICD-10-CM | POA: Insufficient documentation

## 2020-07-30 DIAGNOSIS — N1831 Chronic kidney disease, stage 3a: Secondary | ICD-10-CM

## 2020-07-30 DIAGNOSIS — E785 Hyperlipidemia, unspecified: Secondary | ICD-10-CM | POA: Diagnosis not present

## 2020-07-30 DIAGNOSIS — Z7984 Long term (current) use of oral hypoglycemic drugs: Secondary | ICD-10-CM | POA: Insufficient documentation

## 2020-07-30 DIAGNOSIS — I2510 Atherosclerotic heart disease of native coronary artery without angina pectoris: Secondary | ICD-10-CM | POA: Diagnosis not present

## 2020-07-30 DIAGNOSIS — E669 Obesity, unspecified: Secondary | ICD-10-CM | POA: Insufficient documentation

## 2020-07-30 DIAGNOSIS — G4733 Obstructive sleep apnea (adult) (pediatric): Secondary | ICD-10-CM | POA: Insufficient documentation

## 2020-07-30 DIAGNOSIS — Z9989 Dependence on other enabling machines and devices: Secondary | ICD-10-CM

## 2020-07-30 NOTE — Patient Instructions (Signed)
STOP Amiodarone  Keep follow up as scheduled with Dr Shirlee Latch next month   Do the following things EVERYDAY: 1) Weigh yourself in the morning before breakfast. Write it down and keep it in a log. 2) Take your medicines as prescribed 3) Eat low salt foods-Limit salt (sodium) to 2000 mg per day.  4) Stay as active as you can everyday 5) Limit all fluids for the day to less than 2 liters   If you have any questions or concerns before your next appointment please send [place] a message through [place] or call our office at [phone].    TO LEAVE A MESSAGE FOR THE NURSE SELECT OPTION 2, PLEASE LEAVE A MESSAGE INCLUDING: . YOUR NAME . DATE OF BIRTH . CALL BACK NUMBER . REASON FOR CALL**this is important as we prioritize the call backs  YOU WILL RECEIVE A CALL BACK THE SAME DAY AS LONG AS YOU CALL BEFORE 4:00 PM

## 2020-07-30 NOTE — Telephone Encounter (Signed)
Appointment reminder call made Appointment details left for patient

## 2020-08-09 ENCOUNTER — Other Ambulatory Visit (HOSPITAL_COMMUNITY): Payer: Self-pay | Admitting: Cardiology

## 2020-09-06 ENCOUNTER — Other Ambulatory Visit: Payer: Self-pay

## 2020-09-06 ENCOUNTER — Ambulatory Visit (HOSPITAL_BASED_OUTPATIENT_CLINIC_OR_DEPARTMENT_OTHER)
Admission: RE | Admit: 2020-09-06 | Discharge: 2020-09-06 | Disposition: A | Discharge status: Home / Self Care | Payer: BC Managed Care – PPO | Source: Ambulatory Visit | Attending: Cardiology | Admitting: Cardiology

## 2020-09-06 ENCOUNTER — Ambulatory Visit (HOSPITAL_COMMUNITY)
Admission: RE | Admit: 2020-09-06 | Discharge: 2020-09-06 | Disposition: A | Discharge status: Home / Self Care | Payer: BC Managed Care – PPO | Source: Ambulatory Visit | Attending: Cardiology | Admitting: Cardiology

## 2020-09-06 ENCOUNTER — Encounter (HOSPITAL_COMMUNITY): Payer: Self-pay | Admitting: Cardiology

## 2020-09-06 VITALS — BP 110/70 | HR 63 | Wt 275.0 lb

## 2020-09-06 DIAGNOSIS — Z7901 Long term (current) use of anticoagulants: Secondary | ICD-10-CM | POA: Diagnosis not present

## 2020-09-06 DIAGNOSIS — I5022 Chronic systolic (congestive) heart failure: Secondary | ICD-10-CM | POA: Diagnosis not present

## 2020-09-06 DIAGNOSIS — Z79899 Other long term (current) drug therapy: Secondary | ICD-10-CM | POA: Diagnosis not present

## 2020-09-06 DIAGNOSIS — Z7984 Long term (current) use of oral hypoglycemic drugs: Secondary | ICD-10-CM | POA: Diagnosis not present

## 2020-09-06 DIAGNOSIS — Z87891 Personal history of nicotine dependence: Secondary | ICD-10-CM | POA: Insufficient documentation

## 2020-09-06 DIAGNOSIS — E1122 Type 2 diabetes mellitus with diabetic chronic kidney disease: Secondary | ICD-10-CM | POA: Insufficient documentation

## 2020-09-06 DIAGNOSIS — Z86718 Personal history of other venous thrombosis and embolism: Secondary | ICD-10-CM | POA: Insufficient documentation

## 2020-09-06 DIAGNOSIS — N183 Chronic kidney disease, stage 3 unspecified: Secondary | ICD-10-CM | POA: Diagnosis not present

## 2020-09-06 DIAGNOSIS — I252 Old myocardial infarction: Secondary | ICD-10-CM | POA: Diagnosis not present

## 2020-09-06 DIAGNOSIS — I480 Paroxysmal atrial fibrillation: Secondary | ICD-10-CM | POA: Insufficient documentation

## 2020-09-06 DIAGNOSIS — G4733 Obstructive sleep apnea (adult) (pediatric): Secondary | ICD-10-CM | POA: Diagnosis not present

## 2020-09-06 DIAGNOSIS — I4892 Unspecified atrial flutter: Secondary | ICD-10-CM | POA: Insufficient documentation

## 2020-09-06 DIAGNOSIS — I2510 Atherosclerotic heart disease of native coronary artery without angina pectoris: Secondary | ICD-10-CM | POA: Insufficient documentation

## 2020-09-06 DIAGNOSIS — I130 Hypertensive heart and chronic kidney disease with heart failure and stage 1 through stage 4 chronic kidney disease, or unspecified chronic kidney disease: Secondary | ICD-10-CM | POA: Insufficient documentation

## 2020-09-06 DIAGNOSIS — Z6841 Body Mass Index (BMI) 40.0 and over, adult: Secondary | ICD-10-CM | POA: Insufficient documentation

## 2020-09-06 DIAGNOSIS — E785 Hyperlipidemia, unspecified: Secondary | ICD-10-CM | POA: Insufficient documentation

## 2020-09-06 DIAGNOSIS — E669 Obesity, unspecified: Secondary | ICD-10-CM | POA: Diagnosis not present

## 2020-09-06 DIAGNOSIS — Z955 Presence of coronary angioplasty implant and graft: Secondary | ICD-10-CM | POA: Diagnosis not present

## 2020-09-06 HISTORY — DX: Heart failure, unspecified: I50.9

## 2020-09-06 LAB — CBC
HCT: 44.5 % (ref 39.0–52.0)
Hemoglobin: 14.5 g/dL (ref 13.0–17.0)
MCH: 28.8 pg (ref 26.0–34.0)
MCHC: 32.6 g/dL (ref 30.0–36.0)
MCV: 88.5 fL (ref 80.0–100.0)
Platelets: 195 10*3/uL (ref 150–400)
RBC: 5.03 MIL/uL (ref 4.22–5.81)
RDW: 13.4 % (ref 11.5–15.5)
WBC: 4.8 10*3/uL (ref 4.0–10.5)
nRBC: 0 % (ref 0.0–0.2)

## 2020-09-06 LAB — ECHOCARDIOGRAM COMPLETE
Area-P 1/2: 2.32 cm2
Calc EF: 39.5 %
S' Lateral: 4 cm
Single Plane A2C EF: 40.6 %
Single Plane A4C EF: 38.1 %

## 2020-09-06 LAB — BASIC METABOLIC PANEL
Anion gap: 9 (ref 5–15)
BUN: 22 mg/dL — ABNORMAL HIGH (ref 6–20)
CO2: 27 mmol/L (ref 22–32)
Calcium: 9.3 mg/dL (ref 8.9–10.3)
Chloride: 103 mmol/L (ref 98–111)
Creatinine, Ser: 1.54 mg/dL — ABNORMAL HIGH (ref 0.61–1.24)
GFR, Estimated: 52 mL/min — ABNORMAL LOW (ref >60)
Glucose, Bld: 116 mg/dL — ABNORMAL HIGH (ref 70–99)
Potassium: 3.7 mmol/L (ref 3.5–5.1)
Sodium: 139 mmol/L (ref 135–145)

## 2020-09-06 LAB — BRAIN NATRIURETIC PEPTIDE: B Natriuretic Peptide: 30.2 pg/mL (ref 0.0–100.0)

## 2020-09-06 MED ORDER — ENTRESTO 49-51 MG PO TABS
1.0000 | ORAL_TABLET | Freq: Two times a day (BID) | ORAL | 3 refills | Status: DC
Start: 2020-09-06 — End: 2023-02-05

## 2020-09-06 MED ORDER — PERFLUTREN LIPID MICROSPHERE
1.0000 mL | INTRAVENOUS | Status: DC | PRN
  Administered 2020-09-06: 4 mL via INTRAVENOUS
  Filled 2020-09-06: qty 10

## 2020-09-06 NOTE — Patient Instructions (Addendum)
Labs done today. We will contact you only if your labs are abnormal.  INCREASE Entresto to 49-51mg  (1 tablet) by mouth 2 times daily  No other medication changes were made. Please continue all current medications as prescribed.  You have been referred to [facility] Cardiac Electrophysiology. They will contact you to schedule an appointment.  Your physician recommends that you schedule a follow-up appointment in:10 days for a lab only appointment in [place](a paper prescription was provided to you in office today) 3-4 weeks with our Clinic Pharmacist, Lauren and in 2 months with Dr. Marca Ancona.   If you have any questions or concerns before your next appointment please send [place] a message through [place] or call our office at [phone].    TO LEAVE A MESSAGE FOR THE NURSE SELECT OPTION 2, PLEASE LEAVE A MESSAGE INCLUDING: . YOUR NAME . DATE OF BIRTH . CALL BACK NUMBER . REASON FOR CALL**this is important as we prioritize the call backs  YOU WILL RECEIVE A CALL BACK THE SAME DAY AS LONG AS YOU CALL BEFORE 4:00 PM   Do the following things EVERYDAY: 1) Weigh yourself in the morning before breakfast. Write it down and keep it in a log. 2) Take your medicines as prescribed 3) Eat low salt foods--Limit salt (sodium) to 2000 mg per day.  4) Stay as active as you can everyday 5) Limit all fluids for the day to less than 2 liters   At [facility], you and your health needs are our priority. As part of our continuing mission to provide you with exceptional heart care, we have created designated Provider Care Teams. These Care Teams include your primary Cardiologist (physician) and Advanced Practice Providers (APPs- Physician Assistants and Nurse Practitioners) who all work together to provide you with the care you need, when you need it.   You may see any of the following providers on your designated Care Team at your next follow up: Marland Kitchen Dr Arvilla Meres . Dr Marca Ancona . Tonye Becket, NP . Robbie Lis, PA . Karle Plumber, PharmD   Please be sure to bring in all your medications bottles to every appointment.

## 2020-09-06 NOTE — Progress Notes (Signed)
  Echocardiogram 2D Echocardiogram has been performed.  Steven Burnett 09/06/2020, 3:33 PM

## 2020-09-07 NOTE — Progress Notes (Signed)
PCP: Dr Olena Leatherwood Cardiology: Dr Shirlee Latch   HPI: [name] is a 60 y.o. obese male, former smoker, with h/o CAD w/ remote MI and coronary stenting (done at outside facility, details unknown), OSA w/ reported compliance w/ CPAP, h/o provoked DVT following knee surgery, T2DM, HTN, HLD, PAF, and chronic systolic heart failure.   Admitted 05/13/20 for acute hypoxic respiratory failure due to acute CHF>>cardiogenic shock, atrial flutter w/ RVR and ? PNA. Prior to admit he was drinking 48 ounces of alcohol 3-4 days a week. Echo in 10/21 showed EF < 20%, moderately decreased RV systolic function. Briefly on milrinone but weaned off.  Placed on amiodarone drip and chemically converted to NSR. Started GDMT.  Had cath that showed good cardiac output and distal coronary disease, no targets for intervention. Discharge weight 269 pounds.   Echo was done today and reviewed, EF 40% with normal RV and normal IVC.   Today he returns for HF follow up.  Works full time in a Programmer, applications business.  He has been feeling good overall.  Using torsemide only prn.  No BRBPR/melena with apixaban.  No significant exertional dyspnea.  No chest pain.  Weight is up 5 lbs, not exercising as much in the winter.  He is in NSR today, he is no longer taking amiodarone.   ECG (personally reviewed): NSR, poor RWP  Labs (12/21): LDL 67, K 4, creatinine 1.48  PMH: 1. OSA: Uses CPAP.  2. H/o DVT: Provoked, after knee surgery.  3. Type 2 diabetes. 4. CKD: Stage 3, possible diabetic nephropathy.  5. Atrial flutter: noted in 10/21, converted to NSR on amiodarone.  6. CAD: PCI in the remote past.   - LHC (10/21): 80% far distal LAD, patent OM1 stent, 60% dRCA, 50% proximal PLV, 80% mid to distal PDA. No interventional target.  7. 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  - Echo (10/21): EF <20%, moderately decreased RV systolic function.  - RHC  (10/21): mean RA 8, PA 40/23, mean PCWP 21. CI 2.4.  - Echo (2/22): EF 40%, normal RV, normal IVC.    ROS: All systems negative except as listed in HPI, PMH and Problem List.  SH:  Social History   Socioeconomic History  . Marital status: Married    Spouse name: Not on file  . Number of children: Not on file  . Years of education: Not on file  . Highest education level: Not on file  Occupational History  . Not on file  Tobacco Use  . Smoking status: Former Games developer  . Smokeless tobacco: Never Used  . Tobacco comment: Quit in 2014  Substance and Sexual Activity  . Alcohol use: Not Currently    Comment: Former, quit in 2014  . Drug use: Never  . Sexual activity: Not on file  Other Topics Concern  . Not on file  Social History Narrative  . Not on file   Social Determinants of Health   Financial Resource Strain: Not on file  Food Insecurity: Not on file  Transportation Needs: Not on file  Physical Activity: Not on file  Stress: Not on file  Social Connections: Not on file  Intimate Partner Violence: Not on file    FH:  Family History  Problem Relation Age of Onset  . Kidney failure Son        s/p transplant  . Stroke Mother     Past Medical History:  Diagnosis Date  . CAD (  coronary artery disease)    a. s/p prior MI in 2014 with details not available. b. 04/2020: cath showing distal LAD and distal PDA stenosis with patent stent along OM1 and med management of cardiomyopathy recommended.   . Cardiogenic shock (HCC)    a. 04/2020: EF < 20% and possibly tachycardia-mediated in the setting of atrial flutter with RVR  . CHF (congestive heart failure) (HCC)   . DM2 (diabetes mellitus, type 2) (HCC)   . DVT (deep venous thrombosis) (HCC)    Provoked by knee surgery  . HLD (hyperlipidemia)   . HTN (hypertension)   . MI (myocardial infarction) (HCC) 2014   3 stents  . OSA (obstructive sleep apnea)   . Pancreatitis     Current Outpatient Medications  Medication  Sig Dispense Refill  . acetaminophen (TYLENOL) 325 MG tablet Take 325-650 mg by mouth every 6 (six) hours as needed for mild pain or headache.    . allopurinol (ZYLOPRIM) 300 MG tablet Take 300 mg by mouth daily.    Marland Kitchen ALPRAZolam (XANAX) 1 MG tablet Take 1 mg by mouth 3 (three) times daily.     Marland Kitchen apixaban (ELIQUIS) 5 MG TABS tablet Take 1 tablet (5 mg total) by mouth 2 (two) times daily. 180 tablet 1  . atorvastatin (LIPITOR) 80 MG tablet Take 1 tablet (80 mg total) by mouth daily. 90 tablet 1  . carvedilol (COREG) 3.125 MG tablet Take 1 tablet (3.125 mg total) by mouth 2 (two) times daily. 60 tablet 3  . CREON 24000-76000 units CPEP Take 1 capsule by mouth 3 (three) times daily before meals.     . dapagliflozin propanediol (FARXIGA) 10 MG TABS tablet Take 1 tablet (10 mg total) by mouth daily. 90 tablet 1  . fenofibrate (TRICOR) 145 MG tablet Take 145 mg by mouth daily.     . pantoprazole (PROTONIX) 40 MG tablet Take 40 mg by mouth daily.    . sacubitril-valsartan (ENTRESTO) 49-51 MG Take 1 tablet by mouth 2 (two) times daily. 180 tablet 3  . sertraline (ZOLOFT) 100 MG tablet Take 50 mg by mouth in the morning and at bedtime.     Marland Kitchen spironolactone (ALDACTONE) 25 MG tablet Take 1 tablet (25 mg total) by mouth daily. 90 tablet 1  . tadalafil (CIALIS) 5 MG tablet Take 5 mg by mouth daily as needed for erectile dysfunction.     No current facility-administered medications for this encounter.    Vitals:   09/06/20 1542  BP: 110/70  Pulse: 63  SpO2: 97%  Weight: 124.7 kg (275 lb)   Wt Readings from Last 3 Encounters:  09/06/20 124.7 kg (275 lb)  07/30/20 122.8 kg (270 lb 12.8 oz)  07/02/20 120.7 kg (266 lb 3.2 oz)    PHYSICAL EXAM: General: NAD Neck: Thick. No JVD, no thyromegaly or thyroid nodule.  Lungs: Clear to auscultation bilaterally with normal respiratory effort. CV: Nondisplaced PMI.  Heart regular S1/S2, no S3/S4, no murmur.  No peripheral edema.  No carotid bruit.  Normal  pedal pulses.  Abdomen: Soft, nontender, no hepatosplenomegaly, no distention.  Skin: Intact without lesions or rashes.  Neurologic: Alert and oriented x 3.  Psych: Normal affect. Extremities: No clubbing or cyanosis.  HEENT: Normal.   ASSESSMENT & PLAN: 1. Chronic systolic CHF: Echo 04/2020 EF <58%, moderate RV dysfunction. He was in atrial flutter with RVR at admission, not sure how long this was present prior.  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 Coronary angiography  showed primarily distal vessel CAD, no intervention.  Echo today showed EF up to about 40%.   NYHA class II symptoms.  He is not volume overloaded on exam.  - Continue torsemide as needed.  - Continue Coreg 3.125 mg twice a day.  - Increase Entresto to 49/51 bid, BMET today and in 10 days.  - Continue spironolactone 25 mg daily. - Continue Farxiga 10 mg daily.     - Echo today showed that EF is out of ICD range at this point.  2. Atrial flutter: Admitted in atrial flutter with RVR on 10/21 admit. 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 Chemically converted to NSR with IV amiodarone.  He is now maintaining NSR off amiodarone.     - Continue Eliquis 5 mg twice a day.  - I would like him to have atrial flutter ablation, will refer to EP.  3. CAD: Remote history of CAD/PCI apparently in [place]. No chest pain. HS-TnI mildly elevated, suspect demand ischemia with CHF/cardiogenic shock. Coronary angiography in 10/21 with distal coronary disease, no target for intervention.  - Continue statin.  - No ASA given apixaban use.  4. H/o DVT: Prior, provoked by surgery. On Eliquis.  6. CKD Stage III: Creatinine 1.48, follow closely.  7. OSA: Continue CPAP every night.   8. Obesity: Body mass index is 41.81 kg/m. Discussed portion control. Asked him to start walking everyday.   Follow up in 1 month in pharmacy clinic for med titration, see me in 2 months.   Marca Ancona   09/07/2020

## 2020-09-20 ENCOUNTER — Ambulatory Visit: Payer: BC Managed Care – PPO | Admitting: Internal Medicine

## 2020-09-20 ENCOUNTER — Encounter: Payer: Self-pay | Admitting: Internal Medicine

## 2020-09-20 ENCOUNTER — Other Ambulatory Visit: Payer: Self-pay

## 2020-09-20 VITALS — BP 116/82 | HR 69 | Ht 68.0 in | Wt 268.0 lb

## 2020-09-20 DIAGNOSIS — I4892 Unspecified atrial flutter: Secondary | ICD-10-CM

## 2020-09-20 NOTE — Addendum Note (Signed)
Addended by: Roney Mans A on: 09/20/2020 03:01 PM   Modules accepted: Orders

## 2020-09-20 NOTE — Patient Instructions (Addendum)
Medication Instructions:  Your physician recommends that you continue on your current medications as directed. Please refer to the Current Medication list given to you today.  Labwork: None ordered.  Testing/Procedures: None ordered.  Follow-Up:  SEE INSTRUCTION LETTER  Any Other Special Instructions Will Be Listed Below (If Applicable).  If you need a refill on your cardiac medications before your next appointment, please call your pharmacy.     Cardiac electrophysiology: From cell to bedside (7th ed., pp. 1239-1252). [place], [place]: Elsevier.">  Cardiac Ablation Cardiac ablation is a procedure to destroy, or ablate, a small amount of heart tissue in very specific places. The heart has many electrical connections. Sometimes these connections are abnormal and can cause the heart to beat very fast or irregularly. Ablating some of the areas that cause problems can improve the heart's rhythm or return it to normal. Ablation may be done for people who:  Have Wolff-Parkinson-White syndrome.  Have fast heart rhythms (tachycardia).  Have taken medicines for an abnormal heart rhythm (arrhythmia) that were not effective or caused side effects.  Have a high-risk heartbeat that may be life-threatening. During the procedure, a small incision is made in the neck or the groin, and a long, thin tube (catheter) is inserted into the incision and moved to the heart. Small devices (electrodes) on the tip of the catheter will send out electrical currents. A type of X-ray (fluoroscopy) will be used to help guide the catheter and to provide images of the heart. Tell a health care provider about:  Any allergies you have.  All medicines you are taking, including vitamins, herbs, eye drops, creams, and over-the-counter medicines.  Any problems you or family members have had with anesthetic medicines.  Any blood disorders you have.  Any surgeries you have had.  Any medical conditions you have,  such as kidney failure.  Whether you are pregnant or may be pregnant. What are the risks? Generally, this is a safe procedure. However, problems may occur, including:  Infection.  Bruising and bleeding at the catheter insertion site.  Bleeding into the chest, especially into the sac that surrounds the heart. This is a serious complication.  Stroke or blood clots.  Damage to nearby structures or organs.  Allergic reaction to medicines or dyes.  Need for a permanent pacemaker if the normal electrical system is damaged. A pacemaker is a small computer that sends electrical signals to the heart and helps your heart beat normally.  The procedure not being fully effective. This may not be recognized until months later. Repeat ablation procedures are sometimes done. What happens before the procedure? Medicines Ask your health care provider about:  Changing or stopping your regular medicines. This is especially important if you are taking diabetes medicines or blood thinners.  Taking medicines such as aspirin and ibuprofen. These medicines can thin your blood. Do not take these medicines unless your health care provider tells you to take them.  Taking over-the-counter medicines, vitamins, herbs, and supplements. General instructions  Follow instructions from your health care provider about eating or drinking restrictions.  Plan to have someone take you home from the hospital or clinic.  If you will be going home right after the procedure, plan to have someone with you for 24 hours.  Ask your health care provider what steps will be taken to prevent infection. What happens during the procedure?  An IV will be inserted into one of your veins.  You will be given a medicine to help you relax (  sedative).  The skin on your neck or groin will be numbed.  An incision will be made in your neck or your groin.  A needle will be inserted through the incision and into a large vein in your  neck or groin.  A catheter will be inserted into the needle and moved to your heart.  Dye may be injected through the catheter to help your surgeon see the area of the heart that needs treatment.  Electrical currents will be sent from the catheter to ablate heart tissue in desired areas. There are three types of energy that may be used to do this: ? Heat (radiofrequency energy). ? Laser energy. ? Extreme cold (cryoablation).  When the tissue has been ablated, the catheter will be removed.  Pressure will be held on the insertion area to prevent a lot of bleeding.  A bandage (dressing) will be placed over the insertion area. The exact procedure may vary among health care providers and hospitals.   What happens after the procedure?  Your blood pressure, heart rate, breathing rate, and blood oxygen level will be monitored until you leave the hospital or clinic.  Your insertion area will be monitored for bleeding. You will need to lie still for a few hours to ensure that you do not bleed from the insertion area.  Do not drive for 24 hours or as long as told by your health care provider. Summary  Cardiac ablation is a procedure to destroy, or ablate, a small amount of heart tissue using an electrical current. This procedure can improve the heart rhythm or return it to normal.  Tell your health care provider about any medical conditions you may have and all medicines you are taking to treat them.  This is a safe procedure, but problems may occur. Problems may include infection, bruising, damage to nearby organs or structures, or allergic reactions to medicines.  Follow your health care provider's instructions about eating and drinking before the procedure. You may also be told to change or stop some of your medicines.  After the procedure, do not drive for 24 hours or as long as told by your health care provider. This information is not intended to replace advice given to you by your  health care provider. Make sure you discuss any questions you have with your health care provider. Document Revised: 05/16/2019 Document Reviewed: 05/16/2019 Elsevier Patient Education  2021 Elsevier Inc.      

## 2020-09-20 NOTE — Progress Notes (Signed)
HPI [name] is referred by Dr. Shirlee Latch for consideration of atrial flutter ablation. He is a pleasant 60yo man who presented several months ago with a tachy-induced CM and was found to have atrial flutter with 2:1 AV conduction and was treated with IV amiodarone. He reverted back to NSR. His EF has improved. He saw Dr. Shirlee Latch who recommended catheter ablation. He did not know he was in atrial flutter until developing florid heart failure. He admits to prior ETOH abuse. He is no longer drinking.  No Known Allergies   Current Outpatient Medications  Medication Sig Dispense Refill  . acetaminophen (TYLENOL) 325 MG tablet Take 325-650 mg by mouth every 6 (six) hours as needed for mild pain or headache.    . allopurinol (ZYLOPRIM) 300 MG tablet Take 300 mg by mouth daily.    Marland Kitchen ALPRAZolam (XANAX) 1 MG tablet Take 1 mg by mouth 3 (three) times daily.     Marland Kitchen apixaban (ELIQUIS) 5 MG TABS tablet Take 1 tablet (5 mg total) by mouth 2 (two) times daily. 180 tablet 1  . atorvastatin (LIPITOR) 80 MG tablet Take 1 tablet (80 mg total) by mouth daily. 90 tablet 1  . carvedilol (COREG) 3.125 MG tablet Take 1 tablet (3.125 mg total) by mouth 2 (two) times daily. 60 tablet 3  . CREON 24000-76000 units CPEP Take 1 capsule by mouth 3 (three) times daily before meals.     . dapagliflozin propanediol (FARXIGA) 10 MG TABS tablet Take 1 tablet (10 mg total) by mouth daily. 90 tablet 1  . fenofibrate (TRICOR) 145 MG tablet Take 145 mg by mouth daily.     . pantoprazole (PROTONIX) 40 MG tablet Take 40 mg by mouth daily.    . sacubitril-valsartan (ENTRESTO) 49-51 MG Take 1 tablet by mouth 2 (two) times daily. 180 tablet 3  . sertraline (ZOLOFT) 100 MG tablet Take 50 mg by mouth in the morning and at bedtime.     Marland Kitchen spironolactone (ALDACTONE) 25 MG tablet Take 1 tablet (25 mg total) by mouth daily. 90 tablet 1  . tadalafil (CIALIS) 5 MG tablet Take 5 mg by mouth daily as needed for erectile dysfunction.     No  current facility-administered medications for this visit.     Past Medical History:  Diagnosis Date  . CAD (coronary artery disease)    a. s/p prior MI in 2014 with details not available. b. 04/2020: cath showing distal LAD and distal PDA stenosis with patent stent along OM1 and med management of cardiomyopathy recommended.   . Cardiogenic shock (HCC)    a. 04/2020: EF < 20% and possibly tachycardia-mediated in the setting of atrial flutter with RVR  . CHF (congestive heart failure) (HCC)   . DM2 (diabetes mellitus, type 2) (HCC)   . DVT (deep venous thrombosis) (HCC)    Provoked by knee surgery  . HLD (hyperlipidemia)   . HTN (hypertension)   . MI (myocardial infarction) (HCC) 2014   3 stents  . OSA (obstructive sleep apnea)   . Pancreatitis     ROS:   All systems reviewed and negative except as noted in the HPI.   Past Surgical History:  Procedure Laterality Date  . ABDOMINAL SURGERY    . CORONARY STENT INTERVENTION    . KNEE SURGERY    . RIGHT/LEFT HEART CATH AND CORONARY ANGIOGRAPHY N/A 05/10/2020   Procedure: RIGHT/LEFT HEART CATH AND CORONARY ANGIOGRAPHY;  Surgeon: Laurey Morale, MD;  Location: [facility]  Procedure: RIGHT/LEFT HEART CATH AND CORONARY ANGIOGRAPHY;  Surgeon: McLean, Dalton S, MD;  Location: [facility] INVASIVE CV LAB;  Service: Cardiovascular;  Laterality: N/A;          Family History  Problem Relation Age of Onset  . Kidney failure Son        s/p transplant  . Stroke Mother      Social History        Socioeconomic History  . Marital status: Married    Spouse name: Not on file  . Number of children: Not on file  . Years of education: Not on file  . Highest education level: Not on file  Occupational History  . Not on file  Tobacco Use  . Smoking status: Former Smoker  . Smokeless tobacco: Never Used  . Tobacco comment: Quit in 2014  Substance and Sexual Activity  . Alcohol use: Not Currently    Comment: Former, quit in 2014  . Drug use: Never  . Sexual activity:  Not on file  Other Topics Concern  . Not on file  Social History Narrative  . Not on file   Social Determinants of Health   Financial Resource Strain: Not on file  Food Insecurity: Not on file  Transportation Needs: Not on file  Physical Activity: Not on file  Stress: Not on file  Social Connections: Not on file  Intimate Partner Violence: Not on file     BP 116/82   Pulse 69   Ht 5' 8" (1.727 m)   Wt 268 lb (121.6 kg)   SpO2 98%   BMI 40.75 kg/m   Physical Exam:  Well appearing NAD HEENT: Unremarkable Neck:  No JVD, no thyromegally Lymphatics:  No adenopathy Back:  No CVA tenderness Lungs:  Clear with no wheezes HEART:  Regular rate rhythm, no murmurs, no rubs, no clicks Abd:  soft, positive bowel sounds, no organomegally, no rebound, no guarding Ext:  2 plus pulses, no edema, no cyanosis, no clubbing Skin:  No rashes no nodules Neuro:  CN II through XII intact, motor grossly intact  EKG - nsr   Assess/Plan: 1. ATrial flutter - I have discussed the treatment options with the patient and his wife. He has been referred for ablation by Dr. McLean. I have discussed the inidcations/risks/benefits/goals/expectations of EPS/RFA and he wishes to proceed. 2. CHronic systolic heart failure - his symptoms are class 2 and his EF has improved. He is encouraged to maintain a low sodium diet and not to miss his meds. 3. Obesity - I encouarged him to lose weight. 4. Sleep apnea - he claims to be taking his CPAP and his symptoms are minimal. His wife corroborates his story.   [name],MD  

## 2020-09-21 NOTE — Progress Notes (Incomplete)
***In Progress*** PCP: Dr Olena Leatherwood Cardiology: Dr Shirlee Latch   HPI:  Mr Kalisz is a58 y/o obese male, former smoker, with h/o CAD w/ remote MI and coronary stenting (done at outside facility, details unknown), OSA w/ reported compliance w/ CPAP, h/o provoked DVT following knee surgery, T2DM, HTN,HLD, PAF, and chronic systolic heart failure.  ECHO 05/08/2020 EF < 20% RV moderately reduced.  Admitted10/24/21for acute hypoxic respiratory failure 2/2 acute CHF>>cardiogenic shock, AFL w/ RVR and ? PNA. Prior to admit he was drinking 48ounces of alcohol 3-4 days a week. ECHO was <20%. Briefly on milrinone but gradaully weaned off. Placed on amiodarone drip and chemically converted to NSR. Started GDMT with plan to restart carvedilol as an outpatient. Had cath that showed good cardiac output and distal coronary disease. No targets for intervention. Discharge weight 269 pounds.   Echo was done 09/06/20 and reviewed, EF 40% with normal RV and normal IVC.   Recently returned to HF Clinic for follow up on 09/06/20.  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  He had been feeling good overall.  Was only using torsemide PRN.  No BRBPR/melena with apixaban.  No significant exertional dyspnea.  No chest pain.  Weight was up 5 lbs, not exercising as much in the winter.  He was in NSR in clinic, he was no longer taking amiodarone.   Today he returns to HF clinic for pharmacist medication titration. At last visit with MD, Sherryll Burger was increased to 49/51 mg BID.  He was also referred to Dr. Ladona Ridgel for atrial flutter ablation. Per note on 09/20/20, patient wished to proceed with ablation and procedure has been scheduled for 11/05/20.    Marland Kitchen Shortness of breath/dyspnea on exertion? {YES [id number]  . Orthopnea/PND? {YES [id number] . Edema? {YES [id number] . Lightheadedness/dizziness? {YES [id number] . Daily weights at home? {YES [id number] . Blood pressure/heart rate monitoring at home? {YES  [id number] . Following low-sodium/fluid-restricted diet? {YES NO:22349}  HF Medications: Carvedilol 3.125 mg BID Entresto 49/51 mg BID Spironolactone 25 mg daily Farxiga 10 mg daily Torsemide 20 mg PRN only  Has the patient been experiencing any side effects to the medications prescribed?  {YES NO:22349}  Does the patient have any problems obtaining medications due to transportation or finances?   {YES NO:22349}  Understanding of regimen: {excellent/good/fair/poor:19665} Understanding of indications: {excellent/good/fair/poor:19665} Potential of compliance: {excellent/good/fair/poor:19665} Patient understands to avoid NSAIDs. Patient understands to avoid decongestants.    Pertinent Lab Values: . Serum creatinine ***, BUN ***, Potassium ***, Sodium ***, BNP ***, Magnesium ***, Digoxin ***   Vital Signs: . Weight: *** (last clinic weight: ***) . Blood pressure: ***  . Heart rate: ***   Assessment/Plan: 1. Chronic systolic CHF: Echo 04/2020 EF <09%, moderate RV dysfunction. He was in atrial flutter with RVR at admission, not sure how long this was present prior. 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 Coronary angiography showed primarily distal vessel CAD, no intervention.  Echo 09/06/20 showed EF up to about 40%.    - NYHA class II symptoms.  He is not volume overloaded on exam.  - Continue torsemide PRN only - Continue carvedilol 3.125 mg twice a day.  - Continue Entresto 49/51 mg BID. - Continue spironolactone 25 mg daily. -ContinueFarxiga 10 mg daily.  - Echo today showed that EF is out of ICD range at this point.  2. Atrial flutter: Admitted in atrial flutter with RVR on 10/21 admit. 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 Chemically converted to NSR with IV amiodarone.  He is now maintaining NSR off amiodarone.    - Continue Eliquis 5 mg twice a day.  - Planned Atrial flutter ablation for 10/2020 with Dr. Ladona Ridgel.  3. CAD: Remote  history of CAD/PCI apparently in [place]. No chest pain. HS-TnI mildly elevated, suspect demand ischemia with CHF/cardiogenic shock. Coronary angiography in 10/21 with distal coronary disease, no target for intervention.  - Continue statin.  - No ASA given apixaban use.  4. H/o DVT: Prior, provoked by surgery. On Eliquis.  6. CKD Stage III: Creatinine 1.48, follow closely.  7. OSA: Continue CPAP every night.  8. Obesity: Body mass index is 41.81 kg/m. Discussed portion control. Asked him to start walking everyday.    Karle Plumber, PharmD, BCPS, BCCP, CPP Heart Failure Clinic Pharmacist [phone]

## 2020-09-29 ENCOUNTER — Other Ambulatory Visit (HOSPITAL_COMMUNITY): Payer: Self-pay | Admitting: Adult Health

## 2020-10-01 ENCOUNTER — Telehealth (HOSPITAL_COMMUNITY): Payer: Self-pay | Admitting: Cardiology

## 2020-10-01 NOTE — Telephone Encounter (Signed)
If he cancels for tomorrow, he can be rescheduled for next available pharmacy visit. Looks like this will be mid-April.

## 2020-10-01 NOTE — Telephone Encounter (Signed)
Patient request medication consult, wants to know if he could put off pharmacy appt until later. Lab appt scheduled Friday. Pt can be reached [phone], please advise

## 2020-10-02 ENCOUNTER — Inpatient Hospital Stay (HOSPITAL_COMMUNITY): Admission: RE | Admit: 2020-10-02 | Payer: Self-pay | Source: Ambulatory Visit

## 2020-10-10 ENCOUNTER — Other Ambulatory Visit: Payer: Self-pay | Admitting: Student

## 2020-10-27 ENCOUNTER — Other Ambulatory Visit: Payer: Self-pay | Admitting: Student

## 2020-10-29 NOTE — Telephone Encounter (Signed)
Age 60, weight 122kg, SCr 1.54 on 09/06/20 Last visit March 2022, aflutter indication

## 2020-11-02 ENCOUNTER — Other Ambulatory Visit (HOSPITAL_COMMUNITY)
Admission: RE | Admit: 2020-11-02 | Discharge: 2020-11-02 | Disposition: A | Discharge status: Home / Self Care | Payer: BC Managed Care – PPO | Source: Ambulatory Visit | Attending: Internal Medicine | Admitting: Internal Medicine

## 2020-11-02 DIAGNOSIS — I5022 Chronic systolic (congestive) heart failure: Secondary | ICD-10-CM | POA: Diagnosis not present

## 2020-11-02 DIAGNOSIS — G4730 Sleep apnea, unspecified: Secondary | ICD-10-CM | POA: Diagnosis not present

## 2020-11-02 DIAGNOSIS — I4892 Unspecified atrial flutter: Secondary | ICD-10-CM | POA: Diagnosis present

## 2020-11-02 DIAGNOSIS — I110 Hypertensive heart disease with heart failure: Secondary | ICD-10-CM | POA: Diagnosis not present

## 2020-11-02 DIAGNOSIS — Z7901 Long term (current) use of anticoagulants: Secondary | ICD-10-CM | POA: Diagnosis not present

## 2020-11-02 DIAGNOSIS — E669 Obesity, unspecified: Secondary | ICD-10-CM | POA: Diagnosis not present

## 2020-11-02 DIAGNOSIS — Z01812 Encounter for preprocedural laboratory examination: Secondary | ICD-10-CM | POA: Insufficient documentation

## 2020-11-02 DIAGNOSIS — Z20822 Contact with and (suspected) exposure to covid-19: Secondary | ICD-10-CM | POA: Insufficient documentation

## 2020-11-02 DIAGNOSIS — Z7984 Long term (current) use of oral hypoglycemic drugs: Secondary | ICD-10-CM | POA: Diagnosis not present

## 2020-11-02 DIAGNOSIS — Z79899 Other long term (current) drug therapy: Secondary | ICD-10-CM | POA: Diagnosis not present

## 2020-11-02 DIAGNOSIS — Z6841 Body Mass Index (BMI) 40.0 and over, adult: Secondary | ICD-10-CM | POA: Diagnosis not present

## 2020-11-02 DIAGNOSIS — I471 Supraventricular tachycardia: Secondary | ICD-10-CM | POA: Diagnosis not present

## 2020-11-02 DIAGNOSIS — Z87891 Personal history of nicotine dependence: Secondary | ICD-10-CM | POA: Diagnosis not present

## 2020-11-02 LAB — SARS CORONAVIRUS 2 (TAT 6-24 HRS): SARS Coronavirus 2: NEGATIVE

## 2020-11-02 NOTE — Pre-Procedure Instructions (Signed)
Attempted to call patient regarding procedure instructions for tomorrow.  Left voicemail on the following items: Arrival time 0730 Nothing to eat or drink after midnight No meds AM of procedure Responsible person to drive you home and stay with you for 24 hrs  Have you missed any doses of anti-coagulant Eliquis

## 2020-11-05 ENCOUNTER — Ambulatory Visit (HOSPITAL_COMMUNITY): Payer: BC Managed Care – PPO | Admitting: Anesthesiology

## 2020-11-05 ENCOUNTER — Ambulatory Visit (HOSPITAL_COMMUNITY)
Admission: RE | Admit: 2020-11-05 | Discharge: 2020-11-05 | Disposition: A | Discharge status: Home / Self Care | Payer: BC Managed Care – PPO | Attending: Internal Medicine | Admitting: Internal Medicine

## 2020-11-05 ENCOUNTER — Other Ambulatory Visit: Payer: Self-pay

## 2020-11-05 ENCOUNTER — Encounter (HOSPITAL_COMMUNITY)
Admission: RE | Disposition: A | Discharge status: Home / Self Care | Payer: Self-pay | Source: Home / Self Care | Attending: Internal Medicine

## 2020-11-05 ENCOUNTER — Encounter (HOSPITAL_COMMUNITY): Payer: Self-pay | Admitting: Internal Medicine

## 2020-11-05 DIAGNOSIS — Z6841 Body Mass Index (BMI) 40.0 and over, adult: Secondary | ICD-10-CM | POA: Insufficient documentation

## 2020-11-05 DIAGNOSIS — I483 Typical atrial flutter: Secondary | ICD-10-CM

## 2020-11-05 DIAGNOSIS — Z7901 Long term (current) use of anticoagulants: Secondary | ICD-10-CM | POA: Insufficient documentation

## 2020-11-05 DIAGNOSIS — Z7984 Long term (current) use of oral hypoglycemic drugs: Secondary | ICD-10-CM | POA: Insufficient documentation

## 2020-11-05 DIAGNOSIS — I471 Supraventricular tachycardia: Secondary | ICD-10-CM | POA: Insufficient documentation

## 2020-11-05 DIAGNOSIS — Z79899 Other long term (current) drug therapy: Secondary | ICD-10-CM | POA: Insufficient documentation

## 2020-11-05 DIAGNOSIS — Z20822 Contact with and (suspected) exposure to covid-19: Secondary | ICD-10-CM | POA: Insufficient documentation

## 2020-11-05 DIAGNOSIS — G4730 Sleep apnea, unspecified: Secondary | ICD-10-CM | POA: Insufficient documentation

## 2020-11-05 DIAGNOSIS — Z87891 Personal history of nicotine dependence: Secondary | ICD-10-CM | POA: Insufficient documentation

## 2020-11-05 DIAGNOSIS — I5022 Chronic systolic (congestive) heart failure: Secondary | ICD-10-CM | POA: Insufficient documentation

## 2020-11-05 DIAGNOSIS — I4892 Unspecified atrial flutter: Secondary | ICD-10-CM | POA: Diagnosis not present

## 2020-11-05 DIAGNOSIS — E669 Obesity, unspecified: Secondary | ICD-10-CM | POA: Insufficient documentation

## 2020-11-05 DIAGNOSIS — I110 Hypertensive heart disease with heart failure: Secondary | ICD-10-CM | POA: Insufficient documentation

## 2020-11-05 HISTORY — PX: A-FLUTTER ABLATION: EP1230

## 2020-11-05 LAB — BASIC METABOLIC PANEL
Anion gap: 6 (ref 5–15)
BUN: 19 mg/dL (ref 6–20)
CO2: 27 mmol/L (ref 22–32)
Calcium: 9.3 mg/dL (ref 8.9–10.3)
Chloride: 107 mmol/L (ref 98–111)
Creatinine, Ser: 1.52 mg/dL — ABNORMAL HIGH (ref 0.61–1.24)
GFR, Estimated: 53 mL/min — ABNORMAL LOW (ref >60)
Glucose, Bld: 98 mg/dL (ref 70–99)
Potassium: 4.2 mmol/L (ref 3.5–5.1)
Sodium: 140 mmol/L (ref 135–145)

## 2020-11-05 LAB — GLUCOSE, CAPILLARY
Glucose-Capillary: 113 mg/dL — ABNORMAL HIGH (ref 70–99)
Glucose-Capillary: 117 mg/dL — ABNORMAL HIGH (ref 70–99)

## 2020-11-05 LAB — CBC
HCT: 44.6 % (ref 39.0–52.0)
Hemoglobin: 14.4 g/dL (ref 13.0–17.0)
MCH: 28.4 pg (ref 26.0–34.0)
MCHC: 32.3 g/dL (ref 30.0–36.0)
MCV: 88 fL (ref 80.0–100.0)
Platelets: 205 10*3/uL (ref 150–400)
RBC: 5.07 MIL/uL (ref 4.22–5.81)
RDW: 13.3 % (ref 11.5–15.5)
WBC: 5.6 10*3/uL (ref 4.0–10.5)
nRBC: 0 % (ref 0.0–0.2)

## 2020-11-05 SURGERY — A-FLUTTER ABLATION
Anesthesia: General

## 2020-11-05 MED ORDER — SODIUM CHLORIDE 0.9 % IV SOLN: 250.0000 mL | INTRAVENOUS | Status: DC | PRN

## 2020-11-05 MED ORDER — SODIUM CHLORIDE 0.9 % IV SOLN: INTRAVENOUS | Status: DC

## 2020-11-05 MED ORDER — ACETAMINOPHEN 325 MG PO TABS
650.0000 mg | ORAL_TABLET | ORAL | Status: DC | PRN
  Administered 2020-11-05: 650 mg via ORAL
  Filled 2020-11-05 (×3): qty 2

## 2020-11-05 MED ORDER — MIDAZOLAM HCL 2 MG/2ML IJ SOLN
INTRAMUSCULAR | Status: DC | PRN
  Administered 2020-11-05: 2 mg via INTRAVENOUS

## 2020-11-05 MED ORDER — SUGAMMADEX SODIUM 200 MG/2ML IV SOLN
INTRAVENOUS | Status: DC | PRN
  Administered 2020-11-05: 400 mg via INTRAVENOUS

## 2020-11-05 MED ORDER — EPHEDRINE SULFATE-NACL 50-0.9 MG/10ML-% IV SOSY
PREFILLED_SYRINGE | INTRAVENOUS | Status: DC | PRN
  Administered 2020-11-05: 10 mg via INTRAVENOUS

## 2020-11-05 MED ORDER — LIDOCAINE 2% (20 MG/ML) 5 ML SYRINGE
INTRAMUSCULAR | Status: DC | PRN
  Administered 2020-11-05: 60 mg via INTRAVENOUS

## 2020-11-05 MED ORDER — SODIUM CHLORIDE 0.9% FLUSH
3.0000 mL | INTRAVENOUS | Status: DC | PRN
Start: 2020-11-05 — End: 2020-11-06

## 2020-11-05 MED ORDER — ONDANSETRON HCL 4 MG/2ML IJ SOLN
INTRAMUSCULAR | Status: DC | PRN
  Administered 2020-11-05: 4 mg via INTRAVENOUS

## 2020-11-05 MED ORDER — FENTANYL CITRATE (PF) 100 MCG/2ML IJ SOLN
25.0000 ug | INTRAMUSCULAR | Status: DC | PRN
Start: 2020-11-05 — End: 2020-11-06

## 2020-11-05 MED ORDER — OXYCODONE HCL 5 MG/5ML PO SOLN
5.0000 mg | Freq: Once | ORAL | Status: DC | PRN
Start: 2020-11-05 — End: 2020-11-06

## 2020-11-05 MED ORDER — DEXAMETHASONE SODIUM PHOSPHATE 10 MG/ML IJ SOLN
INTRAMUSCULAR | Status: DC | PRN
  Administered 2020-11-05: 5 mg via INTRAVENOUS

## 2020-11-05 MED ORDER — HEPARIN SODIUM (PORCINE) 1000 UNIT/ML IJ SOLN
INTRAMUSCULAR | Status: DC | PRN
  Administered 2020-11-05: 1000 [IU] via INTRAVENOUS

## 2020-11-05 MED ORDER — OXYCODONE HCL 5 MG PO TABS
5.0000 mg | ORAL_TABLET | Freq: Once | ORAL | Status: DC | PRN
Start: 2020-11-05 — End: 2020-11-06

## 2020-11-05 MED ORDER — HEPARIN (PORCINE) IN NACL 1000-0.9 UT/500ML-% IV SOLN
INTRAVENOUS | Status: DC | PRN
  Administered 2020-11-05: 500 mL

## 2020-11-05 MED ORDER — SODIUM CHLORIDE 0.9% FLUSH: 3.0000 mL | Freq: Two times a day (BID) | INTRAVENOUS | Status: DC

## 2020-11-05 MED ORDER — PROMETHAZINE HCL 25 MG/ML IJ SOLN
6.2500 mg | INTRAMUSCULAR | Status: DC | PRN
  Filled 2020-11-05: qty 1

## 2020-11-05 MED ORDER — HEPARIN SODIUM (PORCINE) 1000 UNIT/ML IJ SOLN
INTRAMUSCULAR | Status: AC
  Filled 2020-11-05: qty 1

## 2020-11-05 MED ORDER — ONDANSETRON HCL 4 MG/2ML IJ SOLN: 4.0000 mg | Freq: Four times a day (QID) | INTRAMUSCULAR | Status: DC | PRN

## 2020-11-05 MED ORDER — ROCURONIUM BROMIDE 10 MG/ML (PF) SYRINGE
PREFILLED_SYRINGE | INTRAVENOUS | Status: DC | PRN
  Administered 2020-11-05: 20 mg via INTRAVENOUS
  Administered 2020-11-05: 60 mg via INTRAVENOUS

## 2020-11-05 MED ORDER — FENTANYL CITRATE (PF) 250 MCG/5ML IJ SOLN
INTRAMUSCULAR | Status: DC | PRN
  Administered 2020-11-05 (×2): 50 ug via INTRAVENOUS

## 2020-11-05 MED ORDER — HEPARIN (PORCINE) IN NACL 1000-0.9 UT/500ML-% IV SOLN
INTRAVENOUS | Status: AC
  Filled 2020-11-05: qty 500

## 2020-11-05 MED ORDER — PROPOFOL 10 MG/ML IV BOLUS
INTRAVENOUS | Status: DC | PRN
  Administered 2020-11-05: 120 mg via INTRAVENOUS

## 2020-11-05 SURGICAL SUPPLY — 12 items
BLANKET WARM UNDERBOD FULL ACC (MISCELLANEOUS) ×2 IMPLANT
CATH JOSEPH QUAD ALLRED 6F REP (CATHETERS) ×2 IMPLANT
CATH SMTCH THERMOCOOL SF DF (CATHETERS) ×2 IMPLANT
CATH WEBSTER BI DIR CS D-F CRV (CATHETERS) ×2 IMPLANT
PACK EP LATEX FREE (CUSTOM PROCEDURE TRAY) ×2
PACK EP LF (CUSTOM PROCEDURE TRAY) ×1 IMPLANT
PAD PRO RADIOLUCENT 2001M-C (PAD) ×2 IMPLANT
PATCH CARTO3 (PAD) ×2 IMPLANT
SHEATH PINNACLE 6F 10CM (SHEATH) ×2 IMPLANT
SHEATH PINNACLE 7F 10CM (SHEATH) ×2 IMPLANT
SHEATH PINNACLE 8F 10CM (SHEATH) ×2 IMPLANT
TUBING SMART ABLATE COOLFLOW (TUBING) ×2 IMPLANT

## 2020-11-05 NOTE — Anesthesia Procedure Notes (Signed)
Procedure Name: Intubation Date/Time: 11/05/2020 11:05 AM Performed by: Lelon Perla, CRNA Pre-anesthesia Checklist: Patient identified, Emergency Drugs available, Suction available and Patient being monitored Patient Re-evaluated:Patient Re-evaluated prior to induction Oxygen Delivery Method: Circle system utilized Preoxygenation: Pre-oxygenation with 100% oxygen Induction Type: IV induction Ventilation: Mask ventilation without difficulty Laryngoscope Size: Glidescope and 4 Grade View: Grade I Tube type: Oral Tube size: 7.5 mm Number of attempts: 1 Airway Equipment and Method: Stylet and Oral airway Placement Confirmation: ETT inserted through vocal cords under direct vision,  positive ETCO2 and breath sounds checked- equal and bilateral Secured at: 24 cm Tube secured with: Tape Dental Injury: Teeth and Oropharynx as per pre-operative assessment

## 2020-11-05 NOTE — Progress Notes (Signed)
1715 took care of patient post report/ Janne Napoleon RN

## 2020-11-05 NOTE — H&P (Signed)
HPI Steven Burnett is referred by Dr. Shirlee Latch for consideration of atrial flutter ablation. He is a pleasant 60yo man who presented several months ago with a tachy-induced CM and was found to have atrial flutter with 2:1 AV conduction and was treated with IV amiodarone. He reverted back to NSR. His EF has improved. He saw Dr. Shirlee Latch who recommended catheter ablation. He did not know he was in atrial flutter until developing florid heart failure. He admits to prior ETOH abuse. He is no longer drinking.  No Known Allergies         Current Outpatient Medications  Medication Sig Dispense Refill  . acetaminophen (TYLENOL) 325 MG tablet Take 325-650 mg by mouth every 6 (six) hours as needed for mild pain or headache.    . allopurinol (ZYLOPRIM) 300 MG tablet Take 300 mg by mouth daily.    Marland Kitchen ALPRAZolam (XANAX) 1 MG tablet Take 1 mg by mouth 3 (three) times daily.     Marland Kitchen apixaban (ELIQUIS) 5 MG TABS tablet Take 1 tablet (5 mg total) by mouth 2 (two) times daily. 180 tablet 1  . atorvastatin (LIPITOR) 80 MG tablet Take 1 tablet (80 mg total) by mouth daily. 90 tablet 1  . carvedilol (COREG) 3.125 MG tablet Take 1 tablet (3.125 mg total) by mouth 2 (two) times daily. 60 tablet 3  . CREON 24000-76000 units CPEP Take 1 capsule by mouth 3 (three) times daily before meals.     . dapagliflozin propanediol (FARXIGA) 10 MG TABS tablet Take 1 tablet (10 mg total) by mouth daily. 90 tablet 1  . fenofibrate (TRICOR) 145 MG tablet Take 145 mg by mouth daily.     . pantoprazole (PROTONIX) 40 MG tablet Take 40 mg by mouth daily.    . sacubitril-valsartan (ENTRESTO) 49-51 MG Take 1 tablet by mouth 2 (two) times daily. 180 tablet 3  . sertraline (ZOLOFT) 100 MG tablet Take 50 mg by mouth in the morning and at bedtime.     Marland Kitchen spironolactone (ALDACTONE) 25 MG tablet Take 1 tablet (25 mg total) by mouth daily. 90 tablet 1  . tadalafil (CIALIS) 5 MG tablet Take 5 mg by mouth daily as needed for  erectile dysfunction.     No current facility-administered medications for this visit.         Past Medical History:  Diagnosis Date  . CAD (coronary artery disease)    a. s/p prior MI in 2014 with details not available. b. 04/2020: cath showing distal LAD and distal PDA stenosis with patent stent along OM1 and med management of cardiomyopathy recommended.   . Cardiogenic shock (HCC)    a. 04/2020: EF < 20% and possibly tachycardia-mediated in the setting of atrial flutter with RVR  . CHF (congestive heart failure) (HCC)   . DM2 (diabetes mellitus, type 2) (HCC)   . DVT (deep venous thrombosis) (HCC)    Provoked by knee surgery  . HLD (hyperlipidemia)   . HTN (hypertension)   . MI (myocardial infarction) (HCC) 2014   3 stents  . OSA (obstructive sleep apnea)   . Pancreatitis     ROS:   All systems reviewed and negative except as noted in the HPI.        Past Surgical History:  Procedure Laterality Date  . ABDOMINAL SURGERY    . CORONARY STENT INTERVENTION    . KNEE SURGERY    . RIGHT/LEFT HEART CATH AND CORONARY ANGIOGRAPHY N/A 05/10/2020  Procedure: RIGHT/LEFT HEART CATH AND CORONARY ANGIOGRAPHY;  Surgeon: Laurey Morale, MD;  Location: [facility] INVASIVE CV LAB;  Service: Cardiovascular;  Laterality: N/A;          Family History  Problem Relation Age of Onset  . Kidney failure Son        s/p transplant  . Stroke Mother      Social History        Socioeconomic History  . Marital status: Married    Spouse name: Not on file  . Number of children: Not on file  . Years of education: Not on file  . Highest education level: Not on file  Occupational History  . Not on file  Tobacco Use  . Smoking status: Former Games developer  . Smokeless tobacco: Never Used  . Tobacco comment: Quit in 2014  Substance and Sexual Activity  . Alcohol use: Not Currently    Comment: Former, quit in 2014  . Drug use: Never  . Sexual activity:  Not on file  Other Topics Concern  . Not on file  Social History Narrative  . Not on file   Social Determinants of Health   Financial Resource Strain: Not on file  Food Insecurity: Not on file  Transportation Needs: Not on file  Physical Activity: Not on file  Stress: Not on file  Social Connections: Not on file  Intimate Partner Violence: Not on file     BP 116/82   Pulse 69   Ht 5\' 8"  (1.727 m)   Wt 268 lb (121.6 kg)   SpO2 98%   BMI 40.75 kg/m   Physical Exam:  Well appearing NAD HEENT: Unremarkable Neck:  No JVD, no thyromegally Lymphatics:  No adenopathy Back:  No CVA tenderness Lungs:  Clear with no wheezes HEART:  Regular rate rhythm, no murmurs, no rubs, no clicks Abd:  soft, positive bowel sounds, no organomegally, no rebound, no guarding Ext:  2 plus pulses, no edema, no cyanosis, no clubbing Skin:  No rashes no nodules Neuro:  CN II through XII intact, motor grossly intact  EKG - nsr   Assess/Plan: 1. ATrial flutter - I have discussed the treatment options with the patient and his wife. He has been referred for ablation by Dr. . I have discussed the inidcations/risks/benefits/goals/expectations of EPS/RFA and he wishes to proceed. 2. CHronic systolic heart failure - his symptoms are class 2 and his EF has improved. He is encouraged to maintain a low sodium diet and not to miss his meds. 3. Obesity - I encouarged him to lose weight. 4. Sleep apnea - he claims to be taking his CPAP and his symptoms are minimal. His wife corroborates his story.   Shirlee Latch Steven Bonner,MD

## 2020-11-05 NOTE — Transfer of Care (Signed)
Immediate Anesthesia Transfer of Care Note  Patient: Steven Burnett  Procedure(s) Performed: A-FLUTTER ABLATION (N/A )  Patient Location: Cath Lab  Anesthesia Type:General  Level of Consciousness: awake, alert  and oriented  Airway & Oxygen Therapy: Patient Spontanous Breathing and Patient connected to nasal cannula oxygen  Post-op Assessment: Report given to RN and Post -op Vital signs reviewed and stable  Post vital signs: Reviewed and stable  Last Vitals:  Vitals Value Taken Time  BP 122/53 11/05/20 1234  Temp 36.4 C 11/05/20 1232  Pulse 71 11/05/20 1235  Resp 18 11/05/20 1235  SpO2 99 % 11/05/20 1235  Vitals shown include unvalidated device data.  Last Pain:  Vitals:   11/05/20 1232  TempSrc: Temporal  PainSc: Asleep      Patients Stated Pain Goal: 3 (11/05/20 0827)  Complications: No complications documented.

## 2020-11-05 NOTE — Anesthesia Preprocedure Evaluation (Addendum)
Anesthesia Evaluation  Patient identified by MRN, date of birth, ID band Patient awake    Reviewed: Allergy & Precautions, NPO status , Patient's Chart, lab work & pertinent test results, reviewed documented beta blocker date and time   History of Anesthesia Complications Negative for: history of anesthetic complications  Airway Mallampati: II  TM Distance: >3 FB Neck ROM: Full    Dental  (+) Dental Advisory Given, Teeth Intact   Pulmonary sleep apnea and Continuous Positive Airway Pressure Ventilation , former smoker,    Pulmonary exam normal        Cardiovascular hypertension, Pt. on home beta blockers and Pt. on medications + CAD, + Past MI, + Cardiac Stents, +CHF and + DVT  Normal cardiovascular exam   '22 TTE - EF 40%. Global hypokinesis. Grade I diastolic  dysfunction (impaired relaxation). Left atrial size was mildly dilated.     Neuro/Psych negative neurological ROS  negative psych ROS   GI/Hepatic Neg liver ROS, GERD  Controlled and Medicated,  Endo/Other  diabetes, Type obesity  Renal/GU negative Renal ROS     Musculoskeletal negative musculoskeletal ROS (+)   Abdominal (+) + obese,   Peds  Hematology  On eliquis    Anesthesia Other Findings Covid test negative   Reproductive/Obstetrics                            Anesthesia Physical Anesthesia Plan  ASA: III  Anesthesia Plan: General   Post-op Pain Management:    Induction: Intravenous  PONV Risk Score and Plan: 2 and Treatment may vary due to age or medical condition, Ondansetron, Dexamethasone and Midazolam  Airway Management Planned: Oral ETT  Additional Equipment: None  Intra-op Plan:   Post-operative Plan: Extubation in OR  Informed Consent: I have reviewed the patients History and Physical, chart, labs and discussed the procedure including the risks, benefits and alternatives for the proposed  anesthesia with the patient or authorized representative who has indicated his/her understanding and acceptance.     Dental advisory given  Plan Discussed with: CRNA and Anesthesiologist  Anesthesia Plan Comments:        Anesthesia Quick Evaluation

## 2020-11-05 NOTE — Discharge Instructions (Signed)
Cardiac Ablation, Care After RESTART Eliquis  TONIGHT ONE DOSE This sheet gives you information about how to care for yourself after your procedure. Your health care provider may also give you more specific instructions. If you have problems or questions, contact your health care provider. What can I expect after the procedure? After the procedure, it is common to have:  Bruising around your puncture site.  Tenderness around your puncture site.  Skipped heartbeats.  Tiredness (fatigue).  Follow these instructions at home: Puncture site care   Follow instructions from your health care provider about how to take care of your puncture site. Make sure you: ? If present, leave stitches (sutures), skin glue, or adhesive strips in place. These skin closures may need to stay in place for up to 2 weeks. If adhesive strip edges start to loosen and curl up, you may trim the loose edges. Do not remove adhesive strips completely unless your health care provider tells you to do that. ? If a large square bandage is present, this may be removed 24 hours after surgery.   Check your puncture site every day for signs of infection. Check for: ? Redness, swelling, or pain. ? Fluid or blood. If your puncture site starts to bleed, lie down on your back, apply firm pressure to the area, and contact your health care provider. ? Warmth. ? Pus or a bad smell. Driving  Do not drive for at least 4 days after your procedure or however long your health care provider recommends. (Do not resume driving if you have previously been instructed not to drive for other health reasons.)  Do not drive or use heavy machinery while taking prescription pain medicine. Activity  Avoid activities that take a lot of effort for at least 7 days after your procedure.  Do not lift anything that is heavier than 5 lb (4.5 kg) for one week.   No sexual activity for 1 week.   Return to your normal activities as told by your health  care provider. Ask your health care provider what activities are safe for you. General instructions  Take over-the-counter and prescription medicines only as told by your health care provider.  Do not use any products that contain nicotine or tobacco, such as cigarettes and e-cigarettes. If you need help quitting, ask your health care provider.  You may shower after 24 hours, but Do not take baths, swim, or use a hot tub for 1 week.   Do not drink alcohol for 24 hours after your procedure.  Keep all follow-up visits as told by your health care provider. This is important. Contact a health care provider if:  You have redness, mild swelling, or pain around your puncture site.  You have fluid or blood coming from your puncture site that stops after applying firm pressure to the area.  Your puncture site feels warm to the touch.  You have pus or a bad smell coming from your puncture site.  You have a fever.  You have chest pain or discomfort that spreads to your neck, jaw, or arm.  You are sweating a lot.  You feel nauseous.  You have a fast or irregular heartbeat.  You have shortness of breath.  You are dizzy or light-headed and feel the need to lie down.  You have pain or numbness in the arm or leg closest to your puncture site. Get help right away if:  Your puncture site suddenly swells.  Your puncture site is bleeding and the  bleeding does not stop after applying firm pressure to the area. These symptoms may represent a serious problem that is an emergency. Do not wait to see if the symptoms will go away. Get medical help right away. Call your local emergency services (911 in the [place] Do not drive yourself to the hospital. Summary  After the procedure, it is normal to have bruising and tenderness at the puncture site in your groin, neck, or forearm.  Check your puncture site every day for signs of infection.  Get help right away if your puncture site is bleeding and  the bleeding does not stop after applying firm pressure to the area. This is a medical emergency. This information is not intended to replace advice given to you by your health care provider. Make sure you discuss any questions you have with your health care provider.

## 2020-11-05 NOTE — Progress Notes (Signed)
Site area: rt groin fv sheaths x3 Site Prior to Removal:  Level 0 Pressure Applied For: 15 minutes Manual:   yes Patient Status During Pull:  stable Post Pull Site:  Level 0 Post Pull Instructions Given:  yes Post Pull Pulses Present: rt dp palpable Dressing Applied:  Gauze and tegaderm Bedrest begins @ 1300 Comments:   

## 2020-11-06 ENCOUNTER — Encounter (HOSPITAL_COMMUNITY): Payer: Self-pay | Admitting: Cardiology

## 2020-11-06 ENCOUNTER — Encounter (HOSPITAL_COMMUNITY): Payer: Self-pay | Admitting: Internal Medicine

## 2020-11-06 NOTE — Anesthesia Postprocedure Evaluation (Signed)
Anesthesia Post Note  Patient: Steven Burnett  Procedure(s) Performed: A-FLUTTER ABLATION (N/A )     Patient location during evaluation: PACU Anesthesia Type: General Level of consciousness: awake and alert Pain management: pain level controlled Vital Signs Assessment: post-procedure vital signs reviewed and stable Respiratory status: spontaneous breathing, nonlabored ventilation and respiratory function stable Cardiovascular status: blood pressure returned to baseline and stable Postop Assessment: no apparent nausea or vomiting Anesthetic complications: no   No complications documented.  Last Vitals:  Vitals:   11/05/20 1537 11/05/20 1744  BP: 137/64 134/84  Pulse: 83 83  Resp: 18 19  Temp:    SpO2: 97% 98%    Last Pain:  Vitals:   11/05/20 1630  TempSrc:   PainSc: Asleep                 [name]

## 2020-11-16 ENCOUNTER — Encounter (HOSPITAL_COMMUNITY): Payer: Self-pay | Admitting: Cardiology

## 2020-11-20 ENCOUNTER — Encounter (HOSPITAL_COMMUNITY): Payer: Self-pay | Admitting: Cardiology

## 2020-11-20 ENCOUNTER — Other Ambulatory Visit: Payer: Self-pay

## 2020-11-20 ENCOUNTER — Ambulatory Visit (HOSPITAL_COMMUNITY)
Admission: RE | Admit: 2020-11-20 | Discharge: 2020-11-20 | Disposition: A | Discharge status: Home / Self Care | Payer: BC Managed Care – PPO | Source: Ambulatory Visit | Attending: Cardiology | Admitting: Cardiology

## 2020-11-20 VITALS — BP 102/60 | HR 67 | Wt 279.4 lb

## 2020-11-20 DIAGNOSIS — I5022 Chronic systolic (congestive) heart failure: Secondary | ICD-10-CM

## 2020-11-20 DIAGNOSIS — I130 Hypertensive heart and chronic kidney disease with heart failure and stage 1 through stage 4 chronic kidney disease, or unspecified chronic kidney disease: Secondary | ICD-10-CM | POA: Diagnosis present

## 2020-11-20 DIAGNOSIS — Z7901 Long term (current) use of anticoagulants: Secondary | ICD-10-CM | POA: Diagnosis not present

## 2020-11-20 DIAGNOSIS — I2510 Atherosclerotic heart disease of native coronary artery without angina pectoris: Secondary | ICD-10-CM | POA: Insufficient documentation

## 2020-11-20 DIAGNOSIS — Z79899 Other long term (current) drug therapy: Secondary | ICD-10-CM | POA: Diagnosis not present

## 2020-11-20 DIAGNOSIS — Z6841 Body Mass Index (BMI) 40.0 and over, adult: Secondary | ICD-10-CM | POA: Diagnosis not present

## 2020-11-20 DIAGNOSIS — Z7984 Long term (current) use of oral hypoglycemic drugs: Secondary | ICD-10-CM | POA: Insufficient documentation

## 2020-11-20 DIAGNOSIS — Z86718 Personal history of other venous thrombosis and embolism: Secondary | ICD-10-CM | POA: Insufficient documentation

## 2020-11-20 DIAGNOSIS — I480 Paroxysmal atrial fibrillation: Secondary | ICD-10-CM | POA: Diagnosis not present

## 2020-11-20 DIAGNOSIS — I252 Old myocardial infarction: Secondary | ICD-10-CM | POA: Insufficient documentation

## 2020-11-20 DIAGNOSIS — N183 Chronic kidney disease, stage 3 unspecified: Secondary | ICD-10-CM | POA: Insufficient documentation

## 2020-11-20 DIAGNOSIS — E669 Obesity, unspecified: Secondary | ICD-10-CM | POA: Insufficient documentation

## 2020-11-20 DIAGNOSIS — I4892 Unspecified atrial flutter: Secondary | ICD-10-CM | POA: Diagnosis not present

## 2020-11-20 DIAGNOSIS — Z9989 Dependence on other enabling machines and devices: Secondary | ICD-10-CM | POA: Diagnosis not present

## 2020-11-20 DIAGNOSIS — G4733 Obstructive sleep apnea (adult) (pediatric): Secondary | ICD-10-CM | POA: Diagnosis not present

## 2020-11-20 DIAGNOSIS — Z955 Presence of coronary angioplasty implant and graft: Secondary | ICD-10-CM | POA: Diagnosis not present

## 2020-11-20 DIAGNOSIS — E785 Hyperlipidemia, unspecified: Secondary | ICD-10-CM | POA: Diagnosis not present

## 2020-11-20 DIAGNOSIS — E1122 Type 2 diabetes mellitus with diabetic chronic kidney disease: Secondary | ICD-10-CM | POA: Diagnosis not present

## 2020-11-20 DIAGNOSIS — Z87891 Personal history of nicotine dependence: Secondary | ICD-10-CM | POA: Insufficient documentation

## 2020-11-20 LAB — BASIC METABOLIC PANEL
Anion gap: 6 (ref 5–15)
BUN: 13 mg/dL (ref 6–20)
CO2: 28 mmol/L (ref 22–32)
Calcium: 9.2 mg/dL (ref 8.9–10.3)
Chloride: 106 mmol/L (ref 98–111)
Creatinine, Ser: 1.19 mg/dL (ref 0.61–1.24)
GFR, Estimated: >60 mL/min (ref >60)
Glucose, Bld: 134 mg/dL — ABNORMAL HIGH (ref 70–99)
Potassium: 3.9 mmol/L (ref 3.5–5.1)
Sodium: 140 mmol/L (ref 135–145)

## 2020-11-20 MED ORDER — CARVEDILOL 6.25 MG PO TABS: 6.2500 mg | ORAL_TABLET | Freq: Two times a day (BID) | ORAL | 6 refills | Status: DC

## 2020-11-20 MED ORDER — TORSEMIDE 20 MG PO TABS
20.0000 mg | ORAL_TABLET | Freq: Every day | ORAL | 6 refills | Status: DC | PRN
Start: 2020-11-20 — End: 2021-04-30

## 2020-11-20 NOTE — Patient Instructions (Signed)
Increase Carvedilol to 6.25 mg Twice daily   Take Torsemide 20 mg AS NEEDED if you gain 2 lbs overnight or 4 lbs in a week  Labs done today, we will call you for abnormal results  Your physician recommends that you schedule a follow-up appointment in: 3 months  If you have any questions or concerns before your next appointment please send [place] a message through [place] or call our office at [phone].    TO LEAVE A MESSAGE FOR THE NURSE SELECT OPTION 2, PLEASE LEAVE A MESSAGE INCLUDING: . YOUR NAME . DATE OF BIRTH . CALL BACK NUMBER . REASON FOR CALL**this is important as we prioritize the call backs  YOU WILL RECEIVE A CALL BACK THE SAME DAY AS LONG AS YOU CALL BEFORE 4:00 PM  At the [facility], you and your health needs are our priority. As part of our continuing mission to provide you with exceptional heart care, we have created designated Provider Care Teams. These Care Teams include your primary Cardiologist (physician) and Advanced Practice Providers (APPs- Physician Assistants and Nurse Practitioners) who all work together to provide you with the care you need, when you need it.   You may see any of the following providers on your designated Care Team at your next follow up: Marland Kitchen Dr Arvilla Meres . Dr Marca Ancona . Dr Thornell Mule . Tonye Becket, NP . Robbie Lis, PA . Shanda Bumps [place],NP . Karle Plumber, PharmD   Please be sure to bring in all your medications bottles to every appointment.

## 2020-11-21 NOTE — Progress Notes (Signed)
PCP: Dr Steven Burnett Cardiology: Dr Steven Burnett   HPI: [name] is a 60 y.o. obese male, former smoker, with h/o CAD w/ remote MI and coronary stenting (done at outside facility, details unknown), OSA w/ reported compliance w/ CPAP, h/o provoked DVT following knee surgery, T2DM, HTN, HLD, PAF, and chronic systolic heart failure.   Admitted 05/13/20 for acute hypoxic respiratory failure due to acute CHF>>cardiogenic shock, atrial flutter w/ RVR and ? PNA. Prior to admit he was drinking 48 ounces of alcohol 3-4 days a week. Echo in 10/21 showed EF < 20%, moderately decreased RV systolic function. Briefly on milrinone but weaned off.  Placed on amiodarone drip and chemically converted to NSR. Started GDMT.  Had cath that showed good cardiac output and distal coronary disease, no targets for intervention. Discharge weight 269 pounds.   Echo 2/22 showed EF 40% with normal RV and normal IVC.  He had atrial flutter ablation in 2/22.   Today he returns for HF follow up.  Works full time in a Programmer, applications business.  He has been feeling good, no exertional dyspnea or chest pain.  No lightheadedness.  No palpitations.  He is in NSR today. No BRBPR/melena.   ECG (personally reviewed): NSR, anteroseptal Qs, PVCs noted.   Labs (12/21): LDL 67, K 4, creatinine 1.48 Labs (4/22): K 4.2, creatinine 1.5  PMH: 1. OSA: Uses CPAP.  2. H/o DVT: Provoked, after knee surgery.  3. Type 2 diabetes. 4. CKD: Stage 3, possible diabetic nephropathy.  5. Atrial flutter: noted in 10/21, converted to NSR on amiodarone.  - Atrial flutter ablation in 2/22.  6. CAD: PCI in the remote past.   - LHC (10/21): 80% far distal LAD, patent OM1 stent, 60% dRCA, 50% proximal PLV, 80% mid to distal PDA. No interventional target.  7. 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  - Echo (10/21): EF <20%, moderately decreased RV systolic function.  - RHC  (10/21): mean RA 8, PA 40/23, mean PCWP 21. CI 2.4.  - Echo (2/22): EF 40%, normal RV, normal IVC.    ROS: All systems negative except as listed in HPI, PMH and Problem List.  SH:  Social History   Socioeconomic History  . Marital status: Married    Spouse name: Not on file  . Number of children: Not on file  . Years of education: Not on file  . Highest education level: Not on file  Occupational History  . Not on file  Tobacco Use  . Smoking status: Former Games developer  . Smokeless tobacco: Never Used  . Tobacco comment: Quit in 2014  Substance and Sexual Activity  . Alcohol use: Not Currently    Comment: Former, quit in 2014  . Drug use: Never  . Sexual activity: Not on file  Other Topics Concern  . Not on file  Social History Narrative  . Not on file   Social Determinants of Health   Financial Resource Strain: Not on file  Food Insecurity: Not on file  Transportation Needs: Not on file  Physical Activity: Not on file  Stress: Not on file  Social Connections: Not on file  Intimate Partner Violence: Not on file    FH:  Family History  Problem Relation Age of Onset  . Kidney failure Son        s/p transplant  . Stroke Mother      Current Outpatient Medications  Medication Sig Dispense Refill  . acetaminophen (TYLENOL)  325 MG tablet Take 325-650 mg by mouth every 6 (six) hours as needed for mild pain or headache.    . allopurinol (ZYLOPRIM) 300 MG tablet Take 300 mg by mouth daily.    Marland Kitchen ALPRAZolam (XANAX) 1 MG tablet Take 1 mg by mouth 3 (three) times daily.     Marland Kitchen atorvastatin (LIPITOR) 80 MG tablet TAKE 1 TABLET BY MOUTH EVERY DAY 30 tablet 5  . CREON 24000-76000 units CPEP Take 1 capsule by mouth 3 (three) times daily before meals.     . dapagliflozin propanediol (FARXIGA) 10 MG TABS tablet Take 1 tablet (10 mg total) by mouth daily. 90 tablet 1  . diphenhydrAMINE (BENADRYL) 25 mg capsule Take 25 mg by mouth every 6 (six) hours as needed for allergies.    Marland Kitchen  ELIQUIS 5 MG TABS tablet TAKE 1 TABLET BY MOUTH TWICE A DAY 60 tablet 5  . fenofibrate (TRICOR) 145 MG tablet Take 145 mg by mouth daily.     Marland Kitchen loratadine (CLARITIN) 10 MG tablet Take 10 mg by mouth daily.    . Omega-3 Fatty Acids (FISH OIL) 1200 MG CAPS Take 1,200 capsules by mouth in the morning and at bedtime.    . pantoprazole (PROTONIX) 40 MG tablet Take 40 mg by mouth daily.    . sacubitril-valsartan (ENTRESTO) 49-51 MG Take 1 tablet by mouth 2 (two) times daily. 180 tablet 3  . sertraline (ZOLOFT) 100 MG tablet Take 50 mg by mouth in the morning and at bedtime.     Marland Kitchen spironolactone (ALDACTONE) 25 MG tablet Take 1 tablet (25 mg total) by mouth daily. 90 tablet 1  . tadalafil (CIALIS) 5 MG tablet Take 5 mg by mouth daily as needed for erectile dysfunction.    . torsemide (DEMADEX) 20 MG tablet Take 1 tablet (20 mg total) by mouth daily as needed (if you gain 2 lbs in 1 day or 4 lbs in a week). 30 tablet 6  . carvedilol (COREG) 6.25 MG tablet Take 1 tablet (6.25 mg total) by mouth 2 (two) times daily. 60 tablet 6   No current facility-administered medications for this encounter.    Vitals:   11/20/20 1220  BP: 102/60  Pulse: 67  SpO2: 96%  Weight: 126.7 kg (279 lb 6 oz)   Wt Readings from Last 3 Encounters:  11/20/20 126.7 kg (279 lb 6 oz)  11/05/20 122.5 kg (270 lb)  09/20/20 121.6 kg (268 lb)    PHYSICAL EXAM: General: NAD Neck: Thick. No JVD, no thyromegaly or thyroid nodule.  Lungs: Clear to auscultation bilaterally with normal respiratory effort. CV: Nondisplaced PMI.  Heart regular S1/S2, no S3/S4, no murmur.  No peripheral edema.  No carotid bruit.  Normal pedal pulses.  Abdomen: Soft, nontender, no hepatosplenomegaly, no distention.  Skin: Intact without lesions or rashes.  Neurologic: Alert and oriented x 3.  Psych: Normal affect. Extremities: No clubbing or cyanosis.  HEENT: Normal.   ASSESSMENT & PLAN: 1. Chronic systolic CHF: Echo 04/2020 EF <07%, moderate RV  dysfunction. He was in atrial flutter with RVR at admission, not sure how long this was present prior.  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 Coronary angiography showed primarily distal vessel CAD, no intervention.  Echo in 2/22 showed EF up to about 40%.  NYHA class II symptoms.  He is not volume overloaded on exam, mild rise in weight is likely caloric (has not been exercising much).   - Continue torsemide as needed.  - Increase  Coreg to 6.25 mg bid.   - Continue Entresto 49/51 bid.   - Continue spironolactone 25 mg daily. - Continue Farxiga 10 mg daily.     - Needs to increase activity level.  2. Atrial flutter: Admitted in atrial flutter with RVR on 10/21 admit. 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 Chemically converted to NSR with IV amiodarone.  Atrial flutter ablation in 2/22.  He is now maintaining NSR off amiodarone.     - Continue Eliquis 5 mg twice a day.  3. CAD: Remote history of CAD/PCI apparently in [place]. No chest pain. HS-TnI mildly elevated, suspect demand ischemia with CHF/cardiogenic shock. Coronary angiography in 10/21 with distal coronary disease, no target for intervention.  - Continue statin.  - No ASA given apixaban use.  4. H/o DVT: Prior, provoked by surgery. On Eliquis.  6. CKD Stage III: Creatinine 1.5 on last check, follow closely.  7. OSA: Continue CPAP every night.   8. Obesity: Body mass index is 42.48 kg/m. Discussed portion control. Asked him to start walking everyday.   Followup with me in 3 months.   Marca Ancona  11/21/2020

## 2020-12-04 ENCOUNTER — Ambulatory Visit: Payer: BC Managed Care – PPO | Admitting: Internal Medicine

## 2020-12-26 ENCOUNTER — Other Ambulatory Visit (HOSPITAL_COMMUNITY): Payer: Self-pay | Admitting: Adult Health

## 2021-02-22 ENCOUNTER — Encounter (HOSPITAL_COMMUNITY): Payer: BC Managed Care – PPO | Admitting: Cardiology

## 2021-03-23 ENCOUNTER — Other Ambulatory Visit: Payer: Self-pay | Admitting: Cardiology

## 2021-04-30 ENCOUNTER — Ambulatory Visit (HOSPITAL_COMMUNITY)
Admission: RE | Admit: 2021-04-30 | Discharge: 2021-04-30 | Disposition: A | Discharge status: Home / Self Care | Payer: BC Managed Care – PPO | Source: Ambulatory Visit | Attending: Cardiology | Admitting: Cardiology

## 2021-04-30 ENCOUNTER — Encounter (HOSPITAL_COMMUNITY): Payer: Self-pay | Admitting: Cardiology

## 2021-04-30 ENCOUNTER — Other Ambulatory Visit: Payer: Self-pay

## 2021-04-30 VITALS — BP 118/78 | HR 60 | Wt 287.4 lb

## 2021-04-30 DIAGNOSIS — E1122 Type 2 diabetes mellitus with diabetic chronic kidney disease: Secondary | ICD-10-CM | POA: Insufficient documentation

## 2021-04-30 DIAGNOSIS — E669 Obesity, unspecified: Secondary | ICD-10-CM | POA: Diagnosis not present

## 2021-04-30 DIAGNOSIS — Z79899 Other long term (current) drug therapy: Secondary | ICD-10-CM | POA: Diagnosis not present

## 2021-04-30 DIAGNOSIS — E785 Hyperlipidemia, unspecified: Secondary | ICD-10-CM | POA: Insufficient documentation

## 2021-04-30 DIAGNOSIS — I2510 Atherosclerotic heart disease of native coronary artery without angina pectoris: Secondary | ICD-10-CM | POA: Diagnosis not present

## 2021-04-30 DIAGNOSIS — Z6841 Body Mass Index (BMI) 40.0 and over, adult: Secondary | ICD-10-CM | POA: Diagnosis not present

## 2021-04-30 DIAGNOSIS — G4733 Obstructive sleep apnea (adult) (pediatric): Secondary | ICD-10-CM | POA: Diagnosis not present

## 2021-04-30 DIAGNOSIS — I4892 Unspecified atrial flutter: Secondary | ICD-10-CM | POA: Diagnosis not present

## 2021-04-30 DIAGNOSIS — I252 Old myocardial infarction: Secondary | ICD-10-CM | POA: Insufficient documentation

## 2021-04-30 DIAGNOSIS — I5022 Chronic systolic (congestive) heart failure: Secondary | ICD-10-CM | POA: Diagnosis not present

## 2021-04-30 DIAGNOSIS — Z86718 Personal history of other venous thrombosis and embolism: Secondary | ICD-10-CM | POA: Diagnosis not present

## 2021-04-30 DIAGNOSIS — I130 Hypertensive heart and chronic kidney disease with heart failure and stage 1 through stage 4 chronic kidney disease, or unspecified chronic kidney disease: Secondary | ICD-10-CM | POA: Insufficient documentation

## 2021-04-30 DIAGNOSIS — Z7901 Long term (current) use of anticoagulants: Secondary | ICD-10-CM | POA: Insufficient documentation

## 2021-04-30 DIAGNOSIS — Z87891 Personal history of nicotine dependence: Secondary | ICD-10-CM | POA: Diagnosis not present

## 2021-04-30 DIAGNOSIS — Z7984 Long term (current) use of oral hypoglycemic drugs: Secondary | ICD-10-CM | POA: Insufficient documentation

## 2021-04-30 DIAGNOSIS — N183 Chronic kidney disease, stage 3 unspecified: Secondary | ICD-10-CM | POA: Diagnosis not present

## 2021-04-30 DIAGNOSIS — I480 Paroxysmal atrial fibrillation: Secondary | ICD-10-CM | POA: Insufficient documentation

## 2021-04-30 MED ORDER — CARVEDILOL 12.5 MG PO TABS: 12.5000 mg | ORAL_TABLET | Freq: Two times a day (BID) | ORAL | 3 refills | Status: DC

## 2021-04-30 NOTE — Progress Notes (Signed)
PCP: Dr Olena Leatherwood Cardiology: Dr Shirlee Latch   HPI: Mr Akel is a 60 y.o. obese male, former smoker, with h/o CAD w/ remote MI and coronary stenting (done at outside facility, details unknown), OSA w/ reported compliance w/ CPAP, h/o provoked DVT following knee surgery, T2DM, HTN, HLD, PAF, and chronic systolic heart failure.   Admitted 05/13/20 for acute hypoxic respiratory failure due to acute CHF>>cardiogenic shock, atrial flutter w/ RVR and ? PNA. Prior to admit he was drinking 48 ounces of alcohol 3-4 days a week. Echo in 10/21 showed EF < 20%, moderately decreased RV systolic function. Briefly on milrinone but weaned off.  Placed on amiodarone drip and chemically converted to NSR. Started GDMT.  Had cath that showed good cardiac output and distal coronary disease, no targets for intervention. Discharge weight 269 pounds.   Echo 2/22 showed EF 40% with normal RV and normal IVC.  He had atrial flutter ablation in 2/22.   Today he returns for HF follow up.  Works full time in a [employer], applications business.  Weight is up 8 lbs.  He reports significant dietary indiscretion (eats a Frosty from [facility] every day). He does not get much exercise.  He is taking care of his father who has dementia which has been a significant stressor.  No significant exertional dyspnea.  No chest pain.  No orthopnea/PND. He uses CPAP nightly.  He is taking 10 mg torsemide 2-3 times/week.   ECG (personally reviewed): NSR, old anterior MI  Labs (12/21): LDL 67, K 4, creatinine 1.48 Labs (4/22): K 4.2, creatinine 1.5 Labs (5/22): K 3.9, creatinine 1.19 Labs (9/22): K 4.1, creatinine 1.49, LDL 44  PMH: 1. OSA: Uses CPAP.  2. H/o DVT: Provoked, after knee surgery.  3. Type 2 diabetes. 4. CKD: Stage 3, possible diabetic nephropathy.  5. Atrial flutter: noted in 10/21, converted to NSR on amiodarone.  - Atrial flutter ablation in 2/22.  6. CAD: PCI in the remote past.   - LHC (10/21): 80% far distal LAD, patent OM1 stent,  60% dRCA, 50% proximal PLV, 80% mid to distal PDA. No interventional target.  7. 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  - Echo (10/21): EF <20%, moderately decreased RV systolic function.  - RHC (10/21): mean RA 8, PA 40/23, mean PCWP 21. CI 2.4.  - Echo (2/22): EF 40%, normal RV, normal IVC.   ROS: All systems negative except as listed in HPI, PMH and Problem List.  SH:  Social History   Socioeconomic History   Marital status: Married    Spouse name: Not on file   Number of children: Not on file   Years of education: Not on file   Highest education level: Not on file  Occupational History   Not on file  Tobacco Use   Smoking status: Former   Smokeless tobacco: Never   Tobacco comments:    Quit in 2014  Substance and Sexual Activity   Alcohol use: Not Currently    Comment: Former, quit in 2014   Drug use: Never   Sexual activity: Not on file  Other Topics Concern   Not on file  Social History Narrative   Not on file   Social Determinants of Health   Financial Resource Strain: Not on file  Food Insecurity: Not on file  Transportation Needs: Not on file  Physical Activity: Not on file  Stress: Not on file  Social Connections: Not on file  Intimate  Partner Violence: Not on file    FH:  Family History  Problem Relation Age of Onset   Kidney failure Son        s/p transplant   Stroke Mother      Current Outpatient Medications  Medication Sig Dispense Refill   acetaminophen (TYLENOL) 325 MG tablet Take 325-650 mg by mouth every 6 (six) hours as needed for mild pain or headache.     allopurinol (ZYLOPRIM) 300 MG tablet Take 300 mg by mouth daily.     ALPRAZolam (XANAX) 1 MG tablet Take 1 mg by mouth 3 (three) times daily.      atorvastatin (LIPITOR) 80 MG tablet TAKE 1 TABLET BY MOUTH EVERY DAY 30 tablet 5   CREON 24000-76000 units CPEP Take 1 capsule by mouth 3 (three)  times daily before meals.      dapagliflozin propanediol (FARXIGA) 10 MG TABS tablet Take 1 tablet (10 mg total) by mouth daily. 90 tablet 1   diphenhydrAMINE (BENADRYL) 25 mg capsule Take 25 mg by mouth every 6 (six) hours as needed for allergies.     ELIQUIS 5 MG TABS tablet TAKE 1 TABLET BY MOUTH TWICE A DAY 60 tablet 5   fenofibrate (TRICOR) 145 MG tablet Take 145 mg by mouth daily.      loratadine (CLARITIN) 10 MG tablet Take 10 mg by mouth daily.     Omega-3 Fatty Acids (FISH OIL) 1200 MG CAPS Take 2 capsules by mouth in the morning and at bedtime.     pantoprazole (PROTONIX) 40 MG tablet Take 40 mg by mouth daily.     sacubitril-valsartan (ENTRESTO) 49-51 MG Take 1 tablet by mouth 2 (two) times daily. 180 tablet 3   sertraline (ZOLOFT) 100 MG tablet Take 50 mg by mouth in the morning and at bedtime.      spironolactone (ALDACTONE) 25 MG tablet Take 1 tablet (25 mg total) by mouth daily. 90 tablet 1   tadalafil (CIALIS) 5 MG tablet Take 5 mg by mouth daily as needed for erectile dysfunction.     torsemide (DEMADEX) 20 MG tablet Take 10 mg by mouth as needed.     carvedilol (COREG) 12.5 MG tablet Take 1 tablet (12.5 mg total) by mouth 2 (two) times daily. 180 tablet 3   No current facility-administered medications for this encounter.    Vitals:   04/30/21 1527  BP: 118/78  Pulse: 60  SpO2: 97%  Weight: 130.4 kg (287 lb 6.4 oz)   Wt Readings from Last 3 Encounters:  04/30/21 130.4 kg (287 lb 6.4 oz)  11/20/20 126.7 kg (279 lb 6 oz)  11/05/20 122.5 kg (270 lb)    PHYSICAL EXAM: General: NAD, obese Neck: Thick. No JVD, no thyromegaly or thyroid nodule.  Lungs: Clear to auscultation bilaterally with normal respiratory effort. CV: Nondisplaced PMI.  Heart regular S1/S2, no S3/S4, no murmur.  No peripheral edema.  No carotid bruit.  Normal pedal pulses.  Abdomen: Soft, nontender, no hepatosplenomegaly, no distention.  Skin: Intact without lesions or rashes.  Neurologic: Alert and  oriented x 3.  Psych: Normal affect. Extremities: No clubbing or cyanosis.  HEENT: Normal.   ASSESSMENT & PLAN: 1. Chronic systolic CHF: Echo 04/2020 EF < 20%, moderate RV dysfunction.  He was in atrial flutter with RVR at admission, not sure how long this was present prior.  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 Coronary angiography showed primarily distal vessel CAD, no intervention.  Echo in 2/22 showed EF  up to about 40%.  NYHA class II symptoms.  He is not volume overloaded on exam, mild rise in weight is likely caloric (has not been exercising much and eating poorly).   - Continue torsemide as needed.  - Increase Coreg to 12.5 mg bid.   - Continue Entresto 49/51 bid.   - Continue spironolactone 25 mg daily. - Continue Farxiga 10 mg daily.     - Needs to increase activity level.  2. Atrial flutter:  Admitted in atrial flutter with RVR on 10/21 admit. 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 Chemically converted to NSR with IV amiodarone.  Atrial flutter ablation in 2/22.  He is now maintaining NSR off amiodarone.     - Continue Eliquis 5 mg twice a day.  3. CAD: Remote history of CAD/PCI apparently in [place].  No chest pain.  HS-TnI mildly elevated, suspect demand ischemia with CHF/cardiogenic shock.  Coronary angiography in 10/21 with distal coronary disease, no target for intervention.  - Continue statin, good lipids in 9/22.  - No ASA given apixaban use.  4. H/o DVT: Prior, provoked by surgery.  On Eliquis.  6. CKD Stage III: Creatinine 1.49 on last check, follow closely.  7. OSA: Continue CPAP every night.    8. Obesity: Body mass index is 43.7 kg/m.  - Discussed portion control and cutting out carbohydrates.  - Increase exercise.  - I will refer to pharmacy clinic for semaglutide.   Followup with me in 3 months.   Marca Ancona  04/30/2021

## 2021-04-30 NOTE — Patient Instructions (Addendum)
EKG done today.  No Labs done today.  INCREASE Carvedilol to 12.5mg  (1 tablet) by mouth 2 times daily.   No other medication changes were made. Please continue all current medications as prescribed.  [facility] They will contact you to schedule an appointment.    Your physician recommends that you schedule a follow-up appointment in: 3 months with an echo prior to your exam.  If you have any questions or concerns before your next appointment please send [place] a message through [place] or call our office at [phone].    TO LEAVE A MESSAGE FOR THE NURSE SELECT OPTION 2, PLEASE LEAVE A MESSAGE INCLUDING: YOUR NAME DATE OF BIRTH CALL BACK NUMBER REASON FOR CALL**this is important as we prioritize the call backs  YOU WILL RECEIVE A CALL BACK THE SAME DAY AS LONG AS YOU CALL BEFORE 4:00 PM   Do the following things EVERYDAY: Weigh yourself in the morning before breakfast. Write it down and keep it in a log. Take your medicines as prescribed Eat low salt foods--Limit salt (sodium) to 2000 mg per day.  Stay as active as you can everyday Limit all fluids for the day to less than 2 liters   At [facility], you and your health needs are our priority. As part of our continuing mission to provide you with exceptional heart care, we have created designated Provider Care Teams. These Care Teams include your primary Cardiologist (physician) and Advanced Practice Providers (APPs- Physician Assistants and Nurse Practitioners) who all work together to provide you with the care you need, when you need it.   You may see any of the following providers on your designated Care Team at your next follow up: Dr Arvilla Meres Dr Carron Curie, NP Robbie Lis, [place] Karle Plumber, PharmD   Please be sure to bring in all your medications bottles to every appointment.

## 2021-05-02 ENCOUNTER — Other Ambulatory Visit (HOSPITAL_COMMUNITY): Payer: Self-pay | Admitting: Cardiology

## 2021-07-24 ENCOUNTER — Telehealth: Payer: Self-pay | Admitting: Pharmacist

## 2021-07-24 MED ORDER — WEGOVY 0.25 MG/0.5ML ~~LOC~~ SOAJ: 0.2500 mg | SUBCUTANEOUS | 0 refills | Status: DC

## 2021-07-24 NOTE — Telephone Encounter (Signed)
Pt referred to PharmD for [facility] therapy. Pt initially referred in Oct 2022, was called 4x by scheduler with voicemails left but pt did not return calls. He called HF clinic the other day asking about referral.  Submitted prior authorization for [facility] which was approved immediately through 01/20/22. Copay is $25/month.  Called pt to discuss starting Wegovy. He is interested in starting therapy. Scheduled appt 1/24 same day he sees Dr Shirlee Latch (lives in [place]). Pharmacy states they should have shipment in the next few days. Pt aware if he does get rx filled sooner he can call clinic for sooner appt as he wants to start with weight loss as soon as possible.

## 2021-07-29 ENCOUNTER — Telehealth: Payer: Self-pay | Admitting: Pharmacist

## 2021-07-29 NOTE — Telephone Encounter (Signed)
Pt left message stating he's been able to pick up his Wegovy and wants to move up his appt if possible (currently scheduled for 1/24). Returned call to pt and left message.

## 2021-07-30 ENCOUNTER — Ambulatory Visit: Payer: BC Managed Care – PPO

## 2021-08-01 ENCOUNTER — Other Ambulatory Visit: Payer: Self-pay

## 2021-08-01 ENCOUNTER — Ambulatory Visit: Payer: BC Managed Care – PPO | Admitting: Pharmacist

## 2021-08-01 VITALS — BP 96/66 | HR 53 | Ht 68.0 in | Wt 286.4 lb

## 2021-08-01 DIAGNOSIS — Z6841 Body Mass Index (BMI) 40.0 and over, adult: Secondary | ICD-10-CM | POA: Diagnosis not present

## 2021-08-01 DIAGNOSIS — E119 Type 2 diabetes mellitus without complications: Secondary | ICD-10-CM | POA: Diagnosis not present

## 2021-08-01 NOTE — Progress Notes (Signed)
Patient ID: Steven Burnett                 DOB: [birth year]                    MRN: [id number]     HPI: Steven Burnett is a 61 y.o. male patient referred to pharmacy clinic by Dr. Aundra Dubin to initiate weight loss therapy with GLP1-RA. PMH is significant for obesity complicated by chronic medical conditions including CAD w/ remote MI and coronary stenting (done at outside facility, details unknown), OSA w/ reported compliance w/ CPAP, h/o provoked DVT following knee surgery, T2DM, HTN, HLD, PAF, and chronic systolic heart failure. . Most recent BMI 43.55.  Patient presents today to PharmD clinic accompanied by his wife. He has many great questions about Wegovy. 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 He has not drank since last October. Does not go to [employer]. He eats a large [employer] frosty almost daily. Has a big sweet tooth. No doing much exercise. Use to go to [employer]. Under a lot of stress with his dad. Dad is currently in nursing home but will move back in with him when he is out.   Patient states that he has a hx of pancreatis. This was back when he was drinking alcohol.   Current weight management medications: none  Previously tried meds: none  Current meds that may affect weight: none  Baseline weight/BMI: 286lb/43.55  Insurance payor: BCBS  Diet:  -Breakfast: Consulting civil engineer [place], egg white and biscuit, fruit loops -Lunch:eats a late breakfast- doesn't eat -Dinner:chicken, fish, [place] chili beans, zucchini, onions, cabbage, collards, string beans, tomatoes, baked potatoes -Snacks: sweets- cookies, apple fritters, wendy frosty daily -Drinks:diet cranberry jucie, mango juice, coke zero, crystal lite  Exercise: none  Family History:  Family History  Problem Relation Age of Onset   Kidney failure Son        s/p transplant   Stroke Mother      Social History: does vape some, quite drinking oct 2021  Labs: Lab Results  Component Value Date   HGBA1C 5.7 (H)  05/04/2020    Wt Readings from Last 1 Encounters:  04/30/21 287 lb 6.4 oz (130.4 kg)    BP Readings from Last 1 Encounters:  04/30/21 118/78   Pulse Readings from Last 1 Encounters:  04/30/21 60       Component Value Date/Time   TRIG 354 (H) 05/04/2020 1923    Past Medical History:  Diagnosis Date   CAD (coronary artery disease)    a. s/p prior MI in 2014 with details not available. b. 04/2020: cath showing distal LAD and distal PDA stenosis with patent stent along OM1 and med management of cardiomyopathy recommended.    Cardiogenic shock ([place])    a. 04/2020: EF < 20% and possibly tachycardia-mediated in the setting of atrial flutter with RVR   CHF (congestive heart failure) ([place])    DM2 (diabetes mellitus, type 2) (H[facility])    DVT (deep venous thrombosis) ([facility])    Provoked by knee surgery   HLD (hyperlipidemia)    HTN (hypertension)    MI (myocardial infarction) ([place]) 2014   3 stents   OSA (obstructive sleep apnea)    Pancreatitis     Current Outpatient Medications on File Prior to Visit  Medication Sig Dispense Refill   acetaminophen (TYLENOL) 325 MG tablet Take 325-650 mg by mouth every 6 (six) hours as needed for mild pain  or headache.     allopurinol (ZYLOPRIM) 300 MG tablet Take 300 mg by mouth daily.     ALPRAZolam (XANAX) 1 MG tablet Take 1 mg by mouth 3 (three) times daily.      atorvastatin (LIPITOR) 80 MG tablet TAKE 1 TABLET BY MOUTH EVERY DAY 30 tablet 5   carvedilol (COREG) 12.5 MG tablet Take 1 tablet (12.5 mg total) by mouth 2 (two) times daily. 180 tablet 3   CREON 24000-76000 units CPEP Take 1 capsule by mouth 3 (three) times daily before meals.      dapagliflozin propanediol (FARXIGA) 10 MG TABS tablet Take 1 tablet (10 mg total) by mouth daily. 90 tablet 1   diphenhydrAMINE (BENADRYL) 25 mg capsule Take 25 mg by mouth every 6 (six) hours as needed for allergies.     ELIQUIS 5 MG TABS tablet TAKE 1 TABLET BY MOUTH TWICE A DAY 60 tablet 5   fenofibrate  (TRICOR) 145 MG tablet Take 145 mg by mouth daily.      loratadine (CLARITIN) 10 MG tablet Take 10 mg by mouth daily.     Omega-3 Fatty Acids (FISH OIL) 1200 MG CAPS Take 2 capsules by mouth in the morning and at bedtime.     pantoprazole (PROTONIX) 40 MG tablet Take 40 mg by mouth daily.     sacubitril-valsartan (ENTRESTO) 49-51 MG Take 1 tablet by mouth 2 (two) times daily. 180 tablet 3   Semaglutide-Weight Management (WEGOVY) 0.25 MG/0.5ML SOAJ Inject 0.25 mg into the skin once a week. 2 mL 0   sertraline (ZOLOFT) 100 MG tablet Take 50 mg by mouth in the morning and at bedtime.      spironolactone (ALDACTONE) 25 MG tablet Take 1 tablet (25 mg total) by mouth daily. 90 tablet 1   tadalafil (CIALIS) 5 MG tablet Take 5 mg by mouth daily as needed for erectile dysfunction.     torsemide (DEMADEX) 20 MG tablet Take 10 mg by mouth as needed.     No current facility-administered medications on file prior to visit.    No Known Allergies   Assessment/Plan:  1. Weight loss - Patient has not met goal of at least 5% of body weight loss with comprehensive lifestyle modifications alone in the past 3-6 months. Pharmacotherapy is appropriate to pursue as augmentation. Will start Wegovy 0.51m weekly. Confirmed patient has no personal or family history of medullary thyroid carcinoma (MTC) or Multiple Endocrine Neoplasia syndrome type 2 (MEN 2). Patient does have a hx of pancreatitis when he was drinking heavily. We discussed the increased risk of pancreatitis. Incidence of <1%. Patient understands the risk and is willing to proceed with therapy.   Advised patient on common side effects including nausea, diarrhea, dyspepsia, decreased appetite, and fatigue. Counseled patient on reducing meal size and how to titrate medication to minimize side effects. Counseled patient to call if intolerable side effects or if experiencing dehydration, abdominal pain, or dizziness. Patient will adhere to dietary modifications  and will target at least 150 minutes of moderate intensity exercise weekly.   I strongly encouraged patient to decrease his consumption of cereal, juice and sweets. I advised that if he couldn't cut back on sweets then he may want to try not eating them at all. We discussed physical activity and incorporating it into his daily routine. Advised to use physical activity for stress instead of eating.   Follow up in 3 months in person, 1 month via telephone.

## 2021-08-01 NOTE — Patient Instructions (Addendum)
Please start walking 30 min per day most days of the week Try to decrease your sweets intake Try to decrease your intake of juice and increase water I will call you in 3-4 weeks to see how you are doing.   GLP-1 Receptor Agonist Counseling Points This medication reduces your appetite and may make you feel fuller longer.  Stop eating when your body tells you that you are full. This will likely happen sooner than you are used to. Store your medication in the fridge until you are ready to use it. Inject your medication in the fatty tissue of your lower abdominal area (2 inches away from belly button) or upper outer thigh. Rotate injection sites. Each pen will last you about 1 month (the first month it will last a few weeks longer). Use a different needle with each weekly injection. Common side effects include: nausea, diarrhea/constipation, and heartburn, and are more likely to occur if you overeat.  Dosing schedule: - Month 1: Inject 0.25 once weekly - Month 2: Inject 0.5 once weekly - Month 3: Inject 1 once weekly - Month 4: Inject 1.7 once weekly  Tips for living a healthier life     Building a Healthy and Balanced Diet Make most of your meal vegetables and fruits -  of your plate. Aim for color and variety, and remember that potatoes dont count as vegetables on the Healthy Eating Plate because of their negative impact on blood sugar.  Go for whole grains -  of your plate. Whole and intact grains--whole wheat, barley, wheat berries, quinoa, oats, brown rice, and foods made with them, such as whole wheat pasta--have a milder effect on blood sugar and insulin than white bread, white rice, and other refined grains.  Protein power -  of your plate. Fish, poultry, beans, and nuts are all healthy, versatile protein sources--they can be mixed into salads, and pair well with vegetables on a plate. Limit red meat, and avoid processed meats such as bacon and sausage.  Healthy plant oils  - in moderation. Choose healthy vegetable oils like olive, canola, soy, corn, sunflower, peanut, and others, and avoid partially hydrogenated oils, which contain unhealthy trans fats. Remember that low-fat does not mean healthy.  Drink water, coffee, or tea. Skip sugary drinks, limit milk and dairy products to one to two servings per day, and limit juice to a small glass per day.  Stay active. The red figure running across the Healthy Eating Plates placemat is a reminder that staying active is also important in weight control.  The main message of the Healthy Eating Plate is to focus on diet quality:  The type of carbohydrate in the diet is more important than the amount of carbohydrate in the diet, because some sources of carbohydrate--like vegetables (other than potatoes), fruits, whole grains, and beans--are healthier than others. The Healthy Eating Plate also advises consumers to avoid sugary beverages, a major source of calories--usually with little nutritional value--in the American diet. The Healthy Eating Plate encourages consumers to use healthy oils, and it does not set a maximum on the percentage of calories people should get each day from healthy sources of fat. In this way, the Healthy Eating Plate recommends the opposite of the low-fat message promoted for decades by the USDA.  [employer]  SUGAR  Sugar is a huge problem in the modern day diet. Sugar is a big contributor to heart disease, diabetes, high triglyceride levels, fatty liver disease and obesity. Sugar is hidden in almost all  packaged foods/beverages. Added sugar is extra sugar that is added beyond what is naturally found and has no nutritional benefit for your body. The American Heart Association recommends limiting added sugars to no more than 25g for women and 36 grams for men per day. There are many names for sugar including maltose, sucrose (names ending in  "ose"), high fructose corn syrup, molasses, cane sugar, corn sweetener, raw sugar, syrup, honey or fruit juice concentrate.   One of the best ways to limit your added sugars is to stop drinking sweetened beverages such as soda, sweet tea, and fruit juice.  There is 65g of added sugars in one 20oz bottle of Coke! That is equal to 7.5 donuts.   Pay attention and read all nutrition facts labels. Below is an examples of a nutrition facts label. The #1 is showing you the total sugars where the # 2 is showing you the added sugars. This one serving has almost the max amount of added sugars per day!     20 oz Soda 65g Sugar = 7.5 Glazed Donuts  16oz Energy  Drink 54g Sugar = 6.5 Glazed Donuts  Large Sweet  Tea 38g Sugar = 4 Glazed Donuts  20oz Sports  Drink 34g Sugar = 3.5 Glazed Donuts  8oz Chocolate Milk 24g Sugar =2.5 Glazed Donuts  8oz Orange  Juice 21g Sugar = 2 Glazed Donuts  1 Juice Box 14g Sugar = 1.5 Glazed Donuts  16oz Water= NO SUGAR!!  EXERCISE  Exercise is good. Weve all heard that. In an ideal world, we would all have time and resources to get plenty of it. When you are active, your heart pumps more efficiently and you will feel better.  Multiple studies show that even walking regularly has benefits that include living a longer life. The American Heart Association recommends 150 minutes per week of exercise (30 minutes per day most days of the week). You can do this in any increment you wish. Nine or more 10-minute walks count. So does an hour-long exercise class. Break the time apart into what will work in your life. Some of the best things you can do include walking briskly, jogging, cycling or swimming laps. Not everyone is ready to exercise. Sometimes we need to start with just getting active. Here are some easy ways to be more active throughout the day:  Take the stairs instead of the elevator  Go for a 10-15 minute walk during your lunch break (find a friend  to make it more enjoyable)  When shopping, park at the back of the parking lot  If you take public transportation, get off one stop early and walk the extra distance  Pace around while making phone calls  Check with your doctor if you arent sure what your limitations may be. Always remember to drink plenty of water when doing any type of exercise. Dont feel like a failure if youre not getting the 90-150 minutes per week. If you started by being a couch potato, then just a 10-minute walk each day is a huge improvement. Start with little victories and work your way up.   HEALTHY EATING TIPS  When looking to improve your eating habits, whether to lose weight, lower blood pressure or just be healthier, it helps to know what a serving size is.   Grains 1 slice of bread,  bagel,  cup pasta or rice  Vegetables 1 cup fresh or raw vegetables,  cup cooked or canned Fruits 1 piece of medium sized  fruit,  cup canned,   Meats/Proteins  cup dried       1 oz meat, 1 egg,  cup cooked beans, nuts or seeds  Dairy        Fats Individual yogurt container, 1 cup (8oz)    1 teaspoon margarine/butter or vegetable  milk or milk alternative, 1 slice of cheese          oil; 1 tablespoon mayonnaise or salad dressing                  Plan ahead: make a menu of the meals for a week then create a grocery list to go with that menu. Consider meals that easily stretch into a night of leftovers, such as stews or casseroles. Or consider making two of your favorite meal and put one in the freezer for another night. Try a night or two each week that is meatless or no cook such as salads. When you get home from the grocery store wash and prepare your vegetables and fruits. Then when you need them they are ready to go.   Tips for going to the grocery store:  Buy store or generic brands  Check the weekly ad from your store on-line or in their in-store flyer  Look at the unit price on the shelf tag to  compare/contrast the costs of different items  Buy fruits/vegetables in season  Carrots, bananas and apples are low-cost, naturally healthy items  If meats or frozen vegetables are on sale, buy some extras and put in your freezer  Limit buying prepared or ready to eat items, even if they are pre-made salads or fruit snacks  Do not shop when youre hungry  Foods at eye level tend to be more expensive. Look on the high and low shelves for deals.  Consider shopping at the farmers market for fresh foods in season.  Avoid the cookie and chip aisles (these are expensive, high in calories and low in nutritional value). Shop on the outside of the grocery store.  Healthy food preparations:  If you cant get lean hamburger, be sure to drain the fat when cooking  Steam, saut (in olive oil), grill or bake foods  Experiment with different seasonings to avoid adding salt to your foods. Kosher salt, sea salt and Himalayan salt are all still salt and should be avoided. Try seasoning food with onion, garlic, thyme, rosemary, basil ect. Onion powder or garlic powder is ok. Avoid if it says salt (ie garlic salt).

## 2021-08-13 ENCOUNTER — Ambulatory Visit: Payer: BC Managed Care – PPO

## 2021-08-13 ENCOUNTER — Encounter (HOSPITAL_COMMUNITY): Payer: Self-pay | Admitting: Cardiology

## 2021-08-13 ENCOUNTER — Ambulatory Visit (HOSPITAL_COMMUNITY)
Admission: RE | Admit: 2021-08-13 | Discharge: 2021-08-13 | Disposition: A | Discharge status: Home / Self Care | Payer: BC Managed Care – PPO | Source: Ambulatory Visit | Attending: Cardiology | Admitting: Cardiology

## 2021-08-13 ENCOUNTER — Ambulatory Visit (HOSPITAL_BASED_OUTPATIENT_CLINIC_OR_DEPARTMENT_OTHER)
Admission: RE | Admit: 2021-08-13 | Discharge: 2021-08-13 | Disposition: A | Discharge status: Home / Self Care | Payer: BC Managed Care – PPO | Source: Ambulatory Visit | Attending: Cardiology | Admitting: Cardiology

## 2021-08-13 ENCOUNTER — Other Ambulatory Visit: Payer: Self-pay

## 2021-08-13 VITALS — BP 110/78 | HR 61 | Wt 284.8 lb

## 2021-08-13 DIAGNOSIS — Z9989 Dependence on other enabling machines and devices: Secondary | ICD-10-CM | POA: Insufficient documentation

## 2021-08-13 DIAGNOSIS — Z7984 Long term (current) use of oral hypoglycemic drugs: Secondary | ICD-10-CM | POA: Diagnosis not present

## 2021-08-13 DIAGNOSIS — I4892 Unspecified atrial flutter: Secondary | ICD-10-CM | POA: Insufficient documentation

## 2021-08-13 DIAGNOSIS — I428 Other cardiomyopathies: Secondary | ICD-10-CM | POA: Diagnosis not present

## 2021-08-13 DIAGNOSIS — I130 Hypertensive heart and chronic kidney disease with heart failure and stage 1 through stage 4 chronic kidney disease, or unspecified chronic kidney disease: Secondary | ICD-10-CM | POA: Insufficient documentation

## 2021-08-13 DIAGNOSIS — I480 Paroxysmal atrial fibrillation: Secondary | ICD-10-CM | POA: Diagnosis not present

## 2021-08-13 DIAGNOSIS — G4733 Obstructive sleep apnea (adult) (pediatric): Secondary | ICD-10-CM | POA: Diagnosis not present

## 2021-08-13 DIAGNOSIS — I5022 Chronic systolic (congestive) heart failure: Secondary | ICD-10-CM | POA: Diagnosis not present

## 2021-08-13 DIAGNOSIS — E1122 Type 2 diabetes mellitus with diabetic chronic kidney disease: Secondary | ICD-10-CM | POA: Insufficient documentation

## 2021-08-13 DIAGNOSIS — Z7901 Long term (current) use of anticoagulants: Secondary | ICD-10-CM | POA: Insufficient documentation

## 2021-08-13 DIAGNOSIS — I252 Old myocardial infarction: Secondary | ICD-10-CM | POA: Diagnosis not present

## 2021-08-13 DIAGNOSIS — I2510 Atherosclerotic heart disease of native coronary artery without angina pectoris: Secondary | ICD-10-CM | POA: Insufficient documentation

## 2021-08-13 DIAGNOSIS — Z79899 Other long term (current) drug therapy: Secondary | ICD-10-CM | POA: Diagnosis not present

## 2021-08-13 DIAGNOSIS — I491 Atrial premature depolarization: Secondary | ICD-10-CM | POA: Diagnosis not present

## 2021-08-13 DIAGNOSIS — F1091 Alcohol use, unspecified, in remission: Secondary | ICD-10-CM | POA: Insufficient documentation

## 2021-08-13 DIAGNOSIS — Z87891 Personal history of nicotine dependence: Secondary | ICD-10-CM | POA: Diagnosis not present

## 2021-08-13 DIAGNOSIS — Z86718 Personal history of other venous thrombosis and embolism: Secondary | ICD-10-CM | POA: Insufficient documentation

## 2021-08-13 DIAGNOSIS — Z7985 Long-term (current) use of injectable non-insulin antidiabetic drugs: Secondary | ICD-10-CM | POA: Diagnosis not present

## 2021-08-13 DIAGNOSIS — Z636 Dependent relative needing care at home: Secondary | ICD-10-CM | POA: Insufficient documentation

## 2021-08-13 DIAGNOSIS — Z6841 Body Mass Index (BMI) 40.0 and over, adult: Secondary | ICD-10-CM | POA: Diagnosis not present

## 2021-08-13 DIAGNOSIS — E785 Hyperlipidemia, unspecified: Secondary | ICD-10-CM | POA: Diagnosis not present

## 2021-08-13 DIAGNOSIS — N183 Chronic kidney disease, stage 3 unspecified: Secondary | ICD-10-CM | POA: Insufficient documentation

## 2021-08-13 DIAGNOSIS — E669 Obesity, unspecified: Secondary | ICD-10-CM | POA: Insufficient documentation

## 2021-08-13 LAB — ECHOCARDIOGRAM COMPLETE
Area-P 1/2: 3.08 cm2
Calc EF: 51.9 %
S' Lateral: 3.6 cm
Single Plane A2C EF: 51.2 %
Single Plane A4C EF: 52.5 %

## 2021-08-13 NOTE — Patient Instructions (Signed)
EKG done today.  No Labs done today.   No medication changes were made. Please continue all current medications as prescribed.  Our social worker will be in contact with you regarding insurance.   Your physician recommends that you schedule a follow-up appointment in: 4 months  If you have any questions or concerns before your next appointment please send [place] a message through [place] or call our office at [phone].    TO LEAVE A MESSAGE FOR THE NURSE SELECT OPTION 2, PLEASE LEAVE A MESSAGE INCLUDING: YOUR NAME DATE OF BIRTH CALL BACK NUMBER REASON FOR CALL**this is important as we prioritize the call backs  YOU WILL RECEIVE A CALL BACK THE SAME DAY AS LONG AS YOU CALL BEFORE 4:00 PM   Do the following things EVERYDAY: Weigh yourself in the morning before breakfast. Write it down and keep it in a log. Take your medicines as prescribed Eat low salt foods--Limit salt (sodium) to 2000 mg per day.  Stay as active as you can everyday Limit all fluids for the day to less than 2 liters   At [facility], you and your health needs are our priority. As part of our continuing mission to provide you with exceptional heart care, we have created designated Provider Care Teams. These Care Teams include your primary Cardiologist (physician) and Advanced Practice Providers (APPs- Physician Assistants and Nurse Practitioners) who all work together to provide you with the care you need, when you need it.   You may see any of the following providers on your designated Care Team at your next follow up: Dr Arvilla Meres Dr Carron Curie, NP Robbie Lis, [place] Karle Plumber, PharmD   Please be sure to bring in all your medications bottles to every appointment.

## 2021-08-13 NOTE — Progress Notes (Signed)
°  Echocardiogram 2D Echocardiogram has been performed.  Steven Burnett 08/13/2021, 3:00 PM

## 2021-08-14 ENCOUNTER — Telehealth (HOSPITAL_COMMUNITY): Payer: Self-pay | Admitting: Licensed Clinical Social Worker

## 2021-08-14 NOTE — Telephone Encounter (Signed)
CSW consulted to speak with pt about insurance concerns.  CSW called pt to discuss- unable to reach- left VM requesting return call  Burna Sis, LCSW Clinical Social Worker [facility] Desk#: [phone] Cell#: [phone]

## 2021-08-14 NOTE — Progress Notes (Signed)
PCP: Dr Sherrie Sport Cardiology: Dr Aundra Dubin   HPI: Steven Burnett is a 61 y.o. obese male, former smoker, with h/o CAD w/ remote MI and coronary stenting (done at outside facility, details unknown), OSA w/ reported compliance w/ CPAP, h/o provoked DVT following knee surgery, T2DM, HTN, HLD, PAF, and chronic systolic heart failure.   Admitted 05/13/20 for acute hypoxic respiratory failure due to acute CHF>>cardiogenic shock, atrial flutter w/ RVR and ? PNA. Prior to admit he was drinking 48 ounces of alcohol 3-4 days a week. Echo in 10/21 showed EF < 20%, moderately decreased RV systolic function. Briefly on milrinone but weaned off.  Placed on amiodarone drip and chemically converted to NSR. Started GDMT.  Had cath that showed good cardiac output and distal coronary disease, no targets for intervention. Discharge weight 269 pounds.   Echo 2/22 showed EF 40% with normal RV and normal IVC.  He had atrial flutter ablation in 2/22.  Echo was done today and reviewed, EF 50% with global hypokinesis, normal RV.   Patient returns for HF follow up.  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  Weight is down 3 lbs.  He is taking care of his father who has dementia which has been a significant stressor. He uses torsemide occasionally. He has started on semaglutide to help with weight loss.  No lightheadedness.  No dyspnea walking on flat ground.  No chest pain.  No orthopnea/PND. He can walk up a flight of stairs without trouble.  He is not smoking or drinking ETOH.  Using CPAP.   ECG (personally reviewed): ectopic atrial rhythm, poor RWP  Labs (12/21): LDL 67, K 4, creatinine 1.48 Labs (4/22): K 4.2, creatinine 1.5 Labs (5/22): K 3.9, creatinine 1.19 Labs (9/22): K 4.1, creatinine 1.49, LDL 44 Labs (1/23): K 4.5, creatinine 1.36, LDL 58  PMH: 1. OSA: Uses CPAP.  2. H/o DVT: Provoked, after knee surgery.  3. Type 2 diabetes. 4. CKD: Stage 3, possible diabetic nephropathy.  5. Atrial flutter: noted in 10/21,  converted to NSR on amiodarone.  - Atrial flutter ablation in 2/22.  6. CAD: PCI in the remote past.   - LHC (10/21): 80% far distal LAD, patent OM1 stent, 60% dRCA, 50% proximal PLV, 80% mid to distal PDA. No interventional target.  7. 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  - Echo (10/21): EF <20%, moderately decreased RV systolic function.  - RHC (10/21): mean RA 8, PA 40/23, mean PCWP 21. CI 2.4.  - Echo (2/22): EF 40%, normal RV, normal IVC.   ROS: All systems negative except as listed in HPI, PMH and Problem List.  SH:  Social History   Socioeconomic History   Marital status: Married    Spouse name: Not on file   Number of children: Not on file   Years of education: Not on file   Highest education level: Not on file  Occupational History   Not on file  Tobacco Use   Smoking status: Former   Smokeless tobacco: Never   Tobacco comments:    Quit in 2014  Substance and Sexual Activity   Alcohol use: Not Currently    Comment: Former, quit in 2014   Drug use: Never   Sexual activity: Not on file  Other Topics Concern   Not on file  Social History Narrative   Not on file   Social Determinants of Health   Financial Resource Strain: Not on file  Food Insecurity: Not on file  Transportation Needs: Not on file  Physical Activity: Not on file  Stress: Not on file  Social Connections: Not on file  Intimate Partner Violence: Not on file    FH:  Family History  Problem Relation Age of Onset   Kidney failure Son        s/p transplant   Stroke Mother      Current Outpatient Medications  Medication Sig Dispense Refill   acetaminophen (TYLENOL) 325 MG tablet Take 325-650 mg by mouth every 6 (six) hours as needed for mild pain or headache.     allopurinol (ZYLOPRIM) 300 MG tablet Take 300 mg by mouth daily.     ALPRAZolam (XANAX) 1 MG tablet Take 1 mg by mouth 3 (three) times  daily.      atorvastatin (LIPITOR) 80 MG tablet TAKE 1 TABLET BY MOUTH EVERY DAY 30 tablet 5   carvedilol (COREG) 12.5 MG tablet Take 1 tablet (12.5 mg total) by mouth 2 (two) times daily. 180 tablet 3   CREON 24000-76000 units CPEP Take 1 capsule by mouth 3 (three) times daily before meals.      dapagliflozin propanediol (FARXIGA) 10 MG TABS tablet Take 1 tablet (10 mg total) by mouth daily. 90 tablet 1   diphenhydrAMINE (BENADRYL) 25 mg capsule Take 25 mg by mouth every 6 (six) hours as needed for allergies.     docusate sodium (COLACE) 100 MG capsule Take 100 mg by mouth in the morning, at noon, and at bedtime.     ELIQUIS 5 MG TABS tablet TAKE 1 TABLET BY MOUTH TWICE A DAY 60 tablet 5   fenofibrate (TRICOR) 145 MG tablet Take 145 mg by mouth daily.      loratadine (CLARITIN) 10 MG tablet Take 10 mg by mouth daily.     Omega-3 Fatty Acids (FISH OIL) 1200 MG CAPS Take 2 capsules by mouth in the morning and at bedtime.     pantoprazole (PROTONIX) 40 MG tablet Take 40 mg by mouth daily.     sacubitril-valsartan (ENTRESTO) 49-51 MG Take 1 tablet by mouth 2 (two) times daily. 180 tablet 3   Semaglutide-Weight Management (WEGOVY) 0.25 MG/0.5ML SOAJ Inject 0.25 mg into the skin once a week. 2 mL 0   sertraline (ZOLOFT) 100 MG tablet Take 50 mg by mouth in the morning and at bedtime.      spironolactone (ALDACTONE) 25 MG tablet Take 1 tablet (25 mg total) by mouth daily. 90 tablet 1   tadalafil (CIALIS) 5 MG tablet Take 5 mg by mouth daily as needed for erectile dysfunction.     torsemide (DEMADEX) 20 MG tablet Take 10 mg by mouth as needed.     No current facility-administered medications for this encounter.    Vitals:   08/13/21 1530  BP: 110/78  Pulse: 61  SpO2: 97%  Weight: 129.2 kg (284 lb 12.8 oz)   Wt Readings from Last 3 Encounters:  08/13/21 129.2 kg (284 lb 12.8 oz)  08/01/21 129.9 kg (286 lb 6.4 oz)  04/30/21 130.4 kg (287 lb 6.4 oz)    PHYSICAL EXAM: General: NAD, obese.   Neck: No JVD, no thyromegaly or thyroid nodule.  Lungs: Clear to auscultation bilaterally with normal respiratory effort. CV: Nondisplaced PMI.  Heart regular S1/S2, no S3/S4, no murmur.  No peripheral edema.  No carotid bruit.  Normal pedal pulses.  Abdomen: Soft, nontender, no hepatosplenomegaly, no distention.  Skin: Intact without lesions or rashes.  Neurologic: Alert  and oriented x 3.  Psych: Normal affect. Extremities: No clubbing or cyanosis.  HEENT: Normal.   ASSESSMENT & PLAN: 1. Chronic systolic CHF: Echo 123456 EF < 20%, moderate RV dysfunction.  He was in atrial flutter with RVR at admission, not sure how long this was present prior.  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 Coronary angiography showed primarily distal vessel CAD, no intervention.  Echo in 2/22 showed EF up to about 40%.  Echo today showed EF 50%, global hypokinesis. NYHA class I-II symptoms.  He is not volume overloaded on exam.   - Continue torsemide as needed.  - Continue Coreg 12.5 mg bid.   - Continue Entresto 49/51 bid.   - Continue spironolactone 25 mg daily. - Continue Farxiga 10 mg daily.     2. Atrial flutter:  Admitted in atrial flutter with RVR on 10/21 admit. 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 Chemically converted to NSR with IV amiodarone.  Atrial flutter ablation in 2/22.  Now off amiodarone.      - Continue Eliquis 5 mg twice a day.  3. CAD: Remote history of CAD/PCI apparently in [place].  No chest pain.  HS-TnI mildly elevated, suspect demand ischemia with CHF/cardiogenic shock.  Coronary angiography in 10/21 with distal coronary disease, no target for intervention.  - Continue statin, good lipids in 9/22.  - No ASA given apixaban use.  4. H/o DVT: Prior, provoked by surgery.  On Eliquis.  6. CKD Stage III: Creatinine 1.36 on last check, follow closely.  7. OSA: Continue CPAP every night.    8. Obesity: Body mass index is 43.3 kg/m.  - He recently  started semaglutide.  9. Ectopic atrial rhythm: Noted on ECG today, monitor.    Followup with me in 4 months.   Steven Burnett  08/14/2021

## 2021-08-14 NOTE — Telephone Encounter (Signed)
Pt returned CSW call to discuss current insurance concerns.  Pt reports he is currently on his wife's COBRA insurance but that ends in June of this year.  He has been researching insurance plans through the [facility] Marketplace since then and found one that covers his medications well but is worried that his current providers will not be in network as it is a Retail buyer and he is a [place] resident.  Pt states that his PCP office was able to enroll to participate with the plan so he wouldn't have to transfer care and he is inquiring if we can do the same at our office.  CSW informed pt that I would have to send this inquiry through leadership as I am not involved in making contracts with insurance companies.  CSW found instructions on how to enroll as an in network provider through insurance's website and forwarded to clinic leadership who is helping to inquire if we can do this.  Will continue to follow and update pt as able  Burna Sis, LCSW Clinical Social Worker [facility] Desk#: [phone] Cell#: [phone]

## 2021-08-15 ENCOUNTER — Encounter (INDEPENDENT_AMBULATORY_CARE_PROVIDER_SITE_OTHER): Payer: Self-pay | Admitting: *Deleted

## 2021-08-15 ENCOUNTER — Other Ambulatory Visit: Payer: Self-pay

## 2021-08-15 ENCOUNTER — Telehealth: Payer: Self-pay | Admitting: Pharmacist

## 2021-08-15 MED ORDER — SEMAGLUTIDE-WEIGHT MANAGEMENT 0.5 MG/0.5ML ~~LOC~~ SOAJ: 0.5000 mg | SUBCUTANEOUS | 0 refills | Status: DC

## 2021-08-15 NOTE — Telephone Encounter (Signed)
Patient requesting that we send the Rx for Wegovy 0.5mg  to his pharmacy. Just took his 3rd shot of the 0.25mg  dose. He did not want all strengths of the [facility] sent in bc he said his pharmacy will mess it up. States he lost 3lb and has only had 1/2 of a frosty this week.

## 2021-08-15 NOTE — Telephone Encounter (Signed)
Patient states he is on his last injection of the 0.25 dose of WEGOVY, would like to know if he can get a refill of the higher dose please advise? [phone] is the best way to get in touch with him. He requested to speak with [facility] our pharmacist.

## 2021-08-19 NOTE — Telephone Encounter (Signed)
rx sent in in another encounter

## 2021-09-02 ENCOUNTER — Other Ambulatory Visit (HOSPITAL_COMMUNITY): Payer: Self-pay | Admitting: Cardiology

## 2021-09-03 ENCOUNTER — Telehealth: Payer: Self-pay | Admitting: Pharmacist

## 2021-09-03 NOTE — Telephone Encounter (Signed)
Patient called and LVM requesting a call back. Called pt back. LVM for him to call back.

## 2021-09-05 NOTE — Telephone Encounter (Signed)
Spoke to patient at 5:00 2/14. Patient states he is having diarrhea 4 stools per day after increasing Wegovy dose. Wants to know if its ok to take antidiarrheal. Advised it was ok to take. If no improvement in a several days then we will need to discuss decreasing medication. Patient to call back with any issues.

## 2021-09-13 NOTE — Telephone Encounter (Signed)
Called pt to see how he was feeling and see if diarrhea improved. He should have 1 more week of 0.5mg  wegovy before increasing to 1mg . LVM for pt to call back.

## 2021-09-20 MED ORDER — WEGOVY 1 MG/0.5ML ~~LOC~~ SOAJ: 1.0000 mg | SUBCUTANEOUS | 0 refills | Status: DC

## 2021-09-20 NOTE — Telephone Encounter (Signed)
Called and spoke with patient. He is still having diarrhea for 3-4 days first associated with stomach gurgling, reflux and then diarrhea. He is still eating a lo of sweets. Down to a frost 3-4 days a week but also eating cookies. Is not exercising. Is stressed with his work and taking care of his dad. ?Finds if he eats subway that his diarrhea isnt as bad. ? ?We talked about doing a sugar detox. Advised he really needs to get away from it. 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 Talked about the dangers of artifical sweeteners as well. Needs to be drinking just plain water. ? ?I advised that he carve out some time every day whether it is early in the AM or at lunch or at night that every day he goes for a walk. Nothing gets in the way of that time he had carved out. Talked about the impact it can have on his mental health, stress level and weight. ? ?Patient wishes to go up to 1mg  of Wegovy even though I did advise his side effects could get worse. ?Rx sent to pharmacy. Will call and f/u in 4 weeks. ?

## 2021-09-20 NOTE — Addendum Note (Signed)
Addended by: Marcelle Overlie D on: 09/20/2021 12:37 PM ? ? Modules accepted: Orders ? ?

## 2021-09-29 ENCOUNTER — Other Ambulatory Visit (HOSPITAL_COMMUNITY): Payer: Self-pay | Admitting: Cardiology

## 2021-10-18 ENCOUNTER — Telehealth: Payer: Self-pay | Admitting: Pharmacist

## 2021-10-18 NOTE — Telephone Encounter (Signed)
Called pt to see how he was going on [facility]. LVM for patient to call back. ?

## 2021-10-23 MED ORDER — WEGOVY 1.7 MG/0.75ML ~~LOC~~ SOAJ: 1.7000 mg | SUBCUTANEOUS | 0 refills | Status: DC

## 2021-10-23 NOTE — Telephone Encounter (Signed)
Pt called clinic, needs to cancel [facility] f/u visit for tomorrow. Will call back to r/s. Reports he has given 4 doses of Wegovy 1mg  and has tolerated them well. Sent in rx for next dose increase of 1.7mg  weekly dosing. ?

## 2021-10-24 ENCOUNTER — Ambulatory Visit: Payer: BC Managed Care – PPO

## 2021-11-15 NOTE — Telephone Encounter (Signed)
LVM to see how patient is doing on [facility] 1.7mg . Patient due to increase to 2.4mg  if tolerating. Will need to follow up on his diet and exercise. Needs 3 month apt scheduled. ?LVM for pt to call back ?

## 2021-11-18 MED ORDER — WEGOVY 2.4 MG/0.75ML ~~LOC~~ SOAJ
2.4000 mg | SUBCUTANEOUS | 11 refills | Status: DC
Start: 2021-11-18 — End: 2022-02-13

## 2021-11-18 NOTE — Telephone Encounter (Signed)
Patient left VM on my machine that he needs next dose of [facility] sent in. Tried to call pt back LVM. 2.4mg  dose sent to pharmacy. Requested patient call back. ?

## 2021-11-25 NOTE — Telephone Encounter (Signed)
Patient called. States that he is having a lot of stomach pains and no appetite. Pain is on and off. Having diarrhea. Stomach pains are better. ?We discussed seeing how he feels by Friday if we re-challenge with 2.4 or try to back down to 1.7.  ? ?Not eating sweets anymore because they give him diarrhea  ? ?He will call me later in the week to let me know how he is feeling. ?

## 2021-12-10 ENCOUNTER — Encounter (HOSPITAL_COMMUNITY): Payer: Self-pay | Admitting: Cardiology

## 2021-12-10 ENCOUNTER — Ambulatory Visit (HOSPITAL_COMMUNITY)
Admission: RE | Admit: 2021-12-10 | Discharge: 2021-12-10 | Disposition: A | Discharge status: Home / Self Care | Payer: BC Managed Care – PPO | Source: Ambulatory Visit | Attending: Cardiology | Admitting: Cardiology

## 2021-12-10 VITALS — BP 106/74 | HR 77 | Wt 261.0 lb

## 2021-12-10 DIAGNOSIS — N183 Chronic kidney disease, stage 3 unspecified: Secondary | ICD-10-CM | POA: Diagnosis not present

## 2021-12-10 DIAGNOSIS — Z79899 Other long term (current) drug therapy: Secondary | ICD-10-CM | POA: Diagnosis not present

## 2021-12-10 DIAGNOSIS — Z86718 Personal history of other venous thrombosis and embolism: Secondary | ICD-10-CM | POA: Diagnosis not present

## 2021-12-10 DIAGNOSIS — I130 Hypertensive heart and chronic kidney disease with heart failure and stage 1 through stage 4 chronic kidney disease, or unspecified chronic kidney disease: Secondary | ICD-10-CM | POA: Insufficient documentation

## 2021-12-10 DIAGNOSIS — E669 Obesity, unspecified: Secondary | ICD-10-CM | POA: Diagnosis not present

## 2021-12-10 DIAGNOSIS — I2510 Atherosclerotic heart disease of native coronary artery without angina pectoris: Secondary | ICD-10-CM | POA: Diagnosis not present

## 2021-12-10 DIAGNOSIS — Z6839 Body mass index (BMI) 39.0-39.9, adult: Secondary | ICD-10-CM | POA: Insufficient documentation

## 2021-12-10 DIAGNOSIS — I4892 Unspecified atrial flutter: Secondary | ICD-10-CM | POA: Diagnosis not present

## 2021-12-10 DIAGNOSIS — E1122 Type 2 diabetes mellitus with diabetic chronic kidney disease: Secondary | ICD-10-CM | POA: Diagnosis not present

## 2021-12-10 DIAGNOSIS — E785 Hyperlipidemia, unspecified: Secondary | ICD-10-CM | POA: Diagnosis not present

## 2021-12-10 DIAGNOSIS — I5022 Chronic systolic (congestive) heart failure: Secondary | ICD-10-CM | POA: Diagnosis not present

## 2021-12-10 DIAGNOSIS — Z7901 Long term (current) use of anticoagulants: Secondary | ICD-10-CM | POA: Diagnosis not present

## 2021-12-10 DIAGNOSIS — G4733 Obstructive sleep apnea (adult) (pediatric): Secondary | ICD-10-CM | POA: Insufficient documentation

## 2021-12-10 DIAGNOSIS — I429 Cardiomyopathy, unspecified: Secondary | ICD-10-CM | POA: Insufficient documentation

## 2021-12-10 DIAGNOSIS — I480 Paroxysmal atrial fibrillation: Secondary | ICD-10-CM | POA: Insufficient documentation

## 2021-12-10 DIAGNOSIS — Z87891 Personal history of nicotine dependence: Secondary | ICD-10-CM | POA: Diagnosis not present

## 2021-12-10 NOTE — Patient Instructions (Addendum)
EKG done today.  No Labs done today.   No medication changes were made. Please continue all current medications as prescribed.  Your physician recommends that you schedule a follow-up appointment in: 2 months for an echo only and in 4 months with Dr. Aundra Dubin.   If you have any questions or concerns before your next appointment please send [place] a message through [place] or call our office at [phone].    TO LEAVE A MESSAGE FOR THE NURSE SELECT OPTION 2, PLEASE LEAVE A MESSAGE INCLUDING: YOUR NAME DATE OF BIRTH CALL BACK NUMBER REASON FOR CALL**this is important as we prioritize the call backs  YOU WILL RECEIVE A CALL BACK THE SAME DAY AS LONG AS YOU CALL BEFORE 4:00 PM   Do the following things EVERYDAY: Weigh yourself in the morning before breakfast. Write it down and keep it in a log. Take your medicines as prescribed Eat low salt foods--Limit salt (sodium) to 2000 mg per day.  Stay as active as you can everyday Limit all fluids for the day to less than 2 liters   At the [facility], you and your health needs are our priority. As part of our continuing mission to provide you with exceptional heart care, we have created designated Provider Care Teams. These Care Teams include your primary Cardiologist (physician) and Advanced Practice Providers (APPs- Physician Assistants and Nurse Practitioners) who all work together to provide you with the care you need, when you need it.   You may see any of the following providers on your designated Care Team at your next follow up: Dr Glori Bickers Dr Haynes Kerns, NP Lyda Jester, [place] Audry Riles, PharmD   Please be sure to bring in all your medications bottles to every appointment.

## 2021-12-11 NOTE — Progress Notes (Signed)
PCP: Dr Sherrie Sport Cardiology: Dr Aundra Dubin   HPI: [name] is a 61 y.o. obese male, former smoker, with h/o CAD w/ remote MI and coronary stenting (done at outside facility, details unknown), OSA w/ reported compliance w/ CPAP, h/o provoked DVT following knee surgery, T2DM, HTN, HLD, PAF, and chronic systolic heart failure.   Admitted 05/13/20 for acute hypoxic respiratory failure due to acute CHF>>cardiogenic shock, atrial flutter w/ RVR and ? PNA. Prior to admit he was drinking 48 ounces of alcohol 3-4 days a week. Echo in 10/21 showed EF < 20%, moderately decreased RV systolic function. Briefly on milrinone but weaned off.  Placed on amiodarone drip and chemically converted to NSR. Started GDMT.  Had cath that showed good cardiac output and distal coronary disease, no targets for intervention. Discharge weight 269 pounds.   Echo 2/22 showed EF 40% with normal RV and normal IVC.  He had atrial flutter ablation in 2/22.  Echo in 1/23 showed EF 50% with global hypokinesis, normal RV.   Patient returns for HF follow up.  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  He is not smoking or drinking ETOH.  Using CPAP.   Weight is down 23 lbs, he is on semaglutide.  No lightheadedness unless he stands up too fast.  No significant exertional dyspnea.  No chest pain.  He uses torsemide occasionally.    ECG (personally reviewed): NSR, lateral TWIs  Labs (12/21): LDL 67, K 4, creatinine 1.48 Labs (4/22): K 4.2, creatinine 1.5 Labs (5/22): K 3.9, creatinine 1.19 Labs (9/22): K 4.1, creatinine 1.49, LDL 44 Labs (1/23): K 4.5, creatinine 1.36, LDL 58  PMH: 1. OSA: Uses CPAP.  2. H/o DVT: Provoked, after knee surgery.  3. Type 2 diabetes. 4. CKD: Stage 3, possible diabetic nephropathy.  5. Atrial flutter: noted in 10/21, converted to NSR on amiodarone.  - Atrial flutter ablation in 2/22.  6. CAD: PCI in the remote past.   - LHC (10/21): 80% far distal LAD, patent OM1 stent, 60% dRCA, 50% proximal PLV,  80% mid to distal PDA. No interventional target.  7. 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  - Echo (10/21): EF <20%, moderately decreased RV systolic function.  - RHC (10/21): mean RA 8, PA 40/23, mean PCWP 21. CI 2.4.  - Echo (2/22): EF 40%, normal RV, normal IVC.  - Echo (1/23): EF 50% with global hypokinesis, normal RV.   ROS: All systems negative except as listed in HPI, PMH and Problem List.  SH:  Social History   Socioeconomic History   Marital status: Married    Spouse name: Not on file   Number of children: Not on file   Years of education: Not on file   Highest education level: Not on file  Occupational History   Not on file  Tobacco Use   Smoking status: Former   Smokeless tobacco: Never   Tobacco comments:    Quit in 2014  Substance and Sexual Activity   Alcohol use: Not Currently    Comment: Former, quit in 2014   Drug use: Never   Sexual activity: Not on file  Other Topics Concern   Not on file  Social History Narrative   Not on file   Social Determinants of Health   Financial Resource Strain: Not on file  Food Insecurity: Not on file  Transportation Needs: Not on file  Physical Activity: Not on file  Stress: Not on file  Social Connections: Not on file  Intimate Partner Violence: Not on file    FH:  Family History  Problem Relation Age of Onset   Kidney failure Son        s/p transplant   Stroke Mother      Current Outpatient Medications  Medication Sig Dispense Refill   acetaminophen (TYLENOL) 325 MG tablet Take 325-650 mg by mouth every 6 (six) hours as needed for mild pain or headache.     allopurinol (ZYLOPRIM) 300 MG tablet Take 300 mg by mouth daily.     ALPRAZolam (XANAX) 1 MG tablet Take 1 mg by mouth 3 (three) times daily.      atorvastatin (LIPITOR) 80 MG tablet TAKE 1 TABLET BY MOUTH EVERY DAY 30 tablet 5   carvedilol (COREG) 12.5 MG  tablet TAKE 1 TABLET BY MOUTH 2 TIMES DAILY. 44 tablet 28   CREON 24000-76000 units CPEP Take 1 capsule by mouth 3 (three) times daily before meals.      dapagliflozin propanediol (FARXIGA) 10 MG TABS tablet Take 1 tablet (10 mg total) by mouth daily. 90 tablet 1   diphenhydrAMINE (BENADRYL) 25 mg capsule Take 25 mg by mouth every 6 (six) hours as needed for allergies.     docusate sodium (COLACE) 100 MG capsule Take 100 mg by mouth in the morning, at noon, and at bedtime.     ELIQUIS 5 MG TABS tablet TAKE 1 TABLET BY MOUTH TWICE A DAY 60 tablet 5   fenofibrate (TRICOR) 145 MG tablet Take 145 mg by mouth daily.      Omega-3 Fatty Acids (FISH OIL) 1200 MG CAPS Take 2 capsules by mouth in the morning and at bedtime.     pantoprazole (PROTONIX) 40 MG tablet Take 40 mg by mouth daily.     sacubitril-valsartan (ENTRESTO) 49-51 MG Take 1 tablet by mouth 2 (two) times daily. 180 tablet 3   Semaglutide-Weight Management (WEGOVY) 2.4 MG/0.75ML SOAJ Inject 2.4 mg into the skin once a week. 3 mL 11   sertraline (ZOLOFT) 100 MG tablet Take 50 mg by mouth in the morning and at bedtime.      spironolactone (ALDACTONE) 25 MG tablet Take 1 tablet (25 mg total) by mouth daily. 90 tablet 1   tadalafil (CIALIS) 5 MG tablet Take 5 mg by mouth daily as needed for erectile dysfunction.     torsemide (DEMADEX) 20 MG tablet Take 10 mg by mouth as needed.     No current facility-administered medications for this encounter.    Vitals:   12/10/21 1437  BP: 106/74  Pulse: 77  SpO2: 96%  Weight: 118.4 kg (261 lb)   Wt Readings from Last 3 Encounters:  12/10/21 118.4 kg (261 lb)  08/13/21 129.2 kg (284 lb 12.8 oz)  08/01/21 129.9 kg (286 lb 6.4 oz)    PHYSICAL EXAM: General: NAD Neck: No JVD, no thyromegaly or thyroid nodule.  Lungs: Clear to auscultation bilaterally with normal respiratory effort. CV: Nondisplaced PMI.  Heart regular S1/S2, no S3/S4, no murmur.  No peripheral edema.  No carotid bruit.  Normal  pedal pulses.  Abdomen: Soft, nontender, no hepatosplenomegaly, no distention.  Skin: Intact without lesions or rashes.  Neurologic: Alert and oriented x 3.  Psych: Normal affect. Extremities: No clubbing or cyanosis.  HEENT: Normal.   ASSESSMENT & PLAN: 1. Chronic systolic CHF: Echo 123456 EF < 20%, moderate RV dysfunction.  He was in atrial flutter with RVR at admission, not sure how long this  was present prior.  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 Coronary angiography showed primarily distal vessel CAD, no intervention.  Echo in 2/22 showed EF up to about 40%.  Echo in 1/23 showed EF up to 50%, global hypokinesis. NYHA class I-II symptoms.  He is not volume overloaded on exam.   - Continue torsemide as needed.  - Continue Coreg 12.5 mg bid.   - Continue Entresto 49/51 bid.   - Continue spironolactone 25 mg daily.  BMET today.  - Continue Farxiga 10 mg daily.     2. Atrial flutter:  Admitted in atrial flutter with RVR on 10/21 admit. 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 Chemically converted to NSR with IV amiodarone.  Atrial flutter ablation in 2/22.  Now off amiodarone.      - Continue Eliquis 5 mg twice a day. CBC today.  3. CAD: Remote history of CAD/PCI apparently in [place].  No chest pain.  HS-TnI mildly elevated, suspect demand ischemia with CHF/cardiogenic shock.  Coronary angiography in 10/21 with distal coronary disease, no target for intervention.  - Continue statin, good lipids in 9/22.  - No ASA given apixaban use.  4. H/o DVT: Prior, provoked by surgery.  On Eliquis.  6. CKD Stage III: Creatinine 1.36 on last check, BMET today.  7. OSA: Continue CPAP every night.    8. Obesity: Body mass index is 39.68 kg/m.  - Weight coming down with semaglutide.   Followup with me in 4 months.   Loralie Champagne  12/11/2021

## 2021-12-26 ENCOUNTER — Other Ambulatory Visit (HOSPITAL_COMMUNITY): Payer: Self-pay | Admitting: Cardiology

## 2022-01-23 ENCOUNTER — Encounter (INDEPENDENT_AMBULATORY_CARE_PROVIDER_SITE_OTHER): Payer: Self-pay | Admitting: *Deleted

## 2022-01-28 ENCOUNTER — Ambulatory Visit (HOSPITAL_COMMUNITY)
Admission: RE | Admit: 2022-01-28 | Discharge: 2022-01-28 | Disposition: A | Discharge status: Home / Self Care | Payer: BC Managed Care – PPO | Source: Ambulatory Visit | Attending: Cardiology | Admitting: Cardiology

## 2022-01-28 DIAGNOSIS — I2510 Atherosclerotic heart disease of native coronary artery without angina pectoris: Secondary | ICD-10-CM | POA: Diagnosis not present

## 2022-01-28 DIAGNOSIS — E119 Type 2 diabetes mellitus without complications: Secondary | ICD-10-CM | POA: Insufficient documentation

## 2022-01-28 DIAGNOSIS — I110 Hypertensive heart disease with heart failure: Secondary | ICD-10-CM | POA: Insufficient documentation

## 2022-01-28 DIAGNOSIS — I5022 Chronic systolic (congestive) heart failure: Secondary | ICD-10-CM | POA: Insufficient documentation

## 2022-01-28 DIAGNOSIS — E785 Hyperlipidemia, unspecified: Secondary | ICD-10-CM | POA: Insufficient documentation

## 2022-01-28 DIAGNOSIS — I252 Old myocardial infarction: Secondary | ICD-10-CM | POA: Insufficient documentation

## 2022-01-28 LAB — ECHOCARDIOGRAM COMPLETE
Area-P 1/2: 4.06 cm2
Calc EF: 52.7 %
S' Lateral: 3.05 cm
Single Plane A2C EF: 50.5 %
Single Plane A4C EF: 54.3 %

## 2022-02-12 ENCOUNTER — Telehealth: Payer: Self-pay | Admitting: Pharmacist

## 2022-02-12 NOTE — Telephone Encounter (Signed)
Patient called and left VM stating that his insurance is changing as of Aug 1 and they will not cover [place] or Farxiga. They do cover Jardiance and Ozempic. Since pt is diabetic, we can change patient from [facility] 2.4 to Ozempic 2mg . Can change to [place]. Called pt to discuss. LVM for him to call back.

## 2022-02-13 MED ORDER — EMPAGLIFLOZIN 10 MG PO TABS: 10.0000 mg | ORAL_TABLET | Freq: Every day | ORAL | 3 refills | Status: DC

## 2022-02-13 MED ORDER — OZEMPIC (2 MG/DOSE) 8 MG/3ML ~~LOC~~ SOPN: 2.0000 mg | PEN_INJECTOR | SUBCUTANEOUS | 11 refills | Status: DC

## 2022-02-13 NOTE — Telephone Encounter (Signed)
Spoke with patient. Will start Ozempic 2g weekly. He will finish out his supply of [place] and then start Jardiance. Advised he can go online to get copay cards for both.  Has lost about 40lb. Diarrhea when he eats the wrong thing occassionally.

## 2022-02-17 IMAGING — US US ABDOMEN LIMITED
1 series · 14 of 25 positions shown · non-contrast
Comparison: CT 05/07/2020

CLINICAL DATA: Elevated LFT

EXAM:
ULTRASOUND ABDOMEN LIMITED RIGHT UPPER QUADRANT

[Series 1: us abdomen limited ruq (liver/gb) · 14 of 27 slices shown]
[im 1/27]
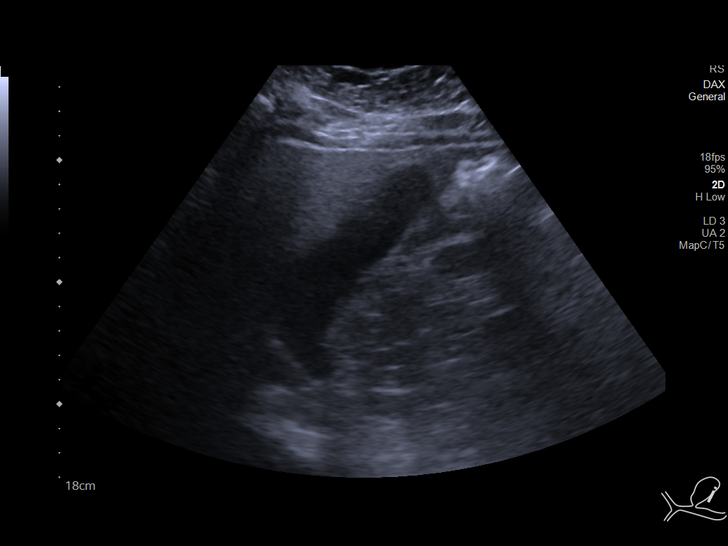
[im 3/27]
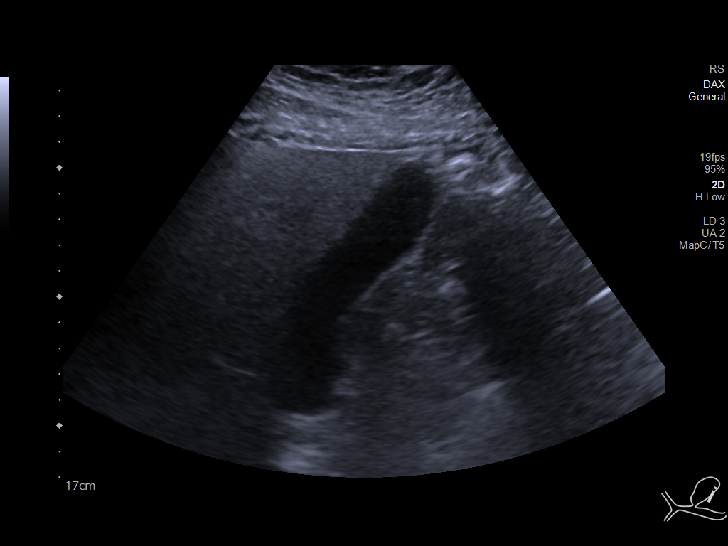
[im 5/27]
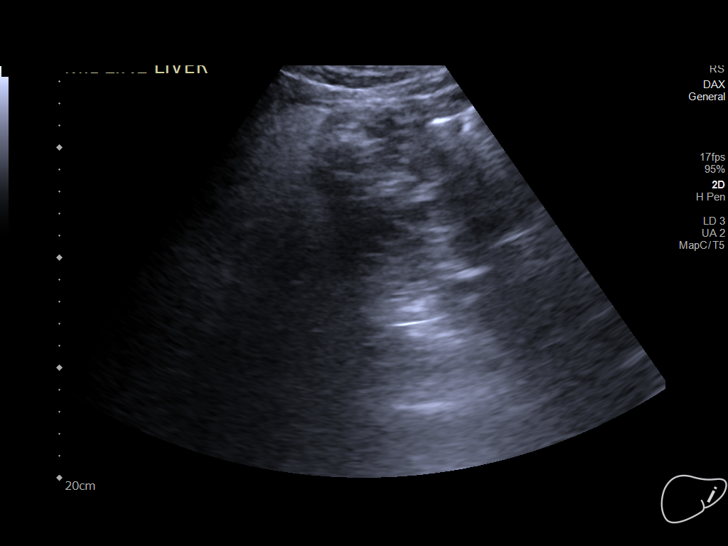
[im 7/27]
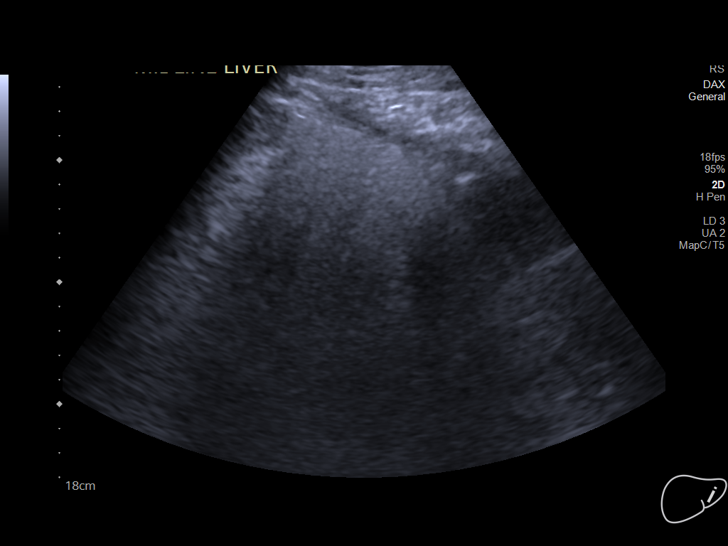
[im 9/27]
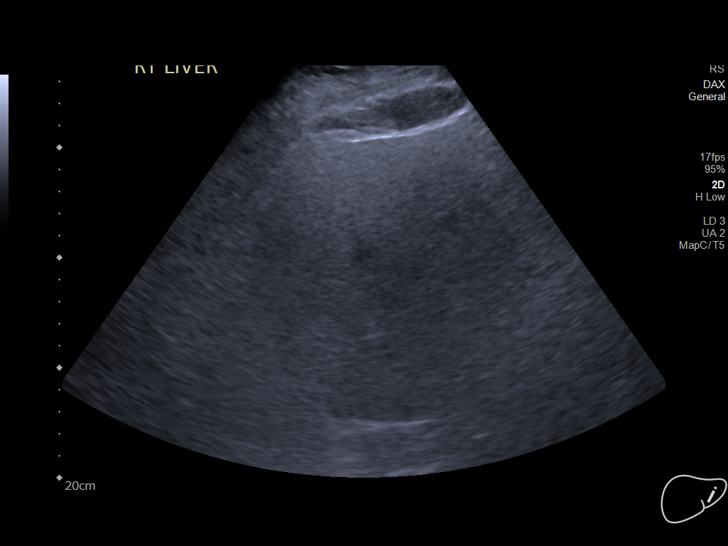
[im 10/27]
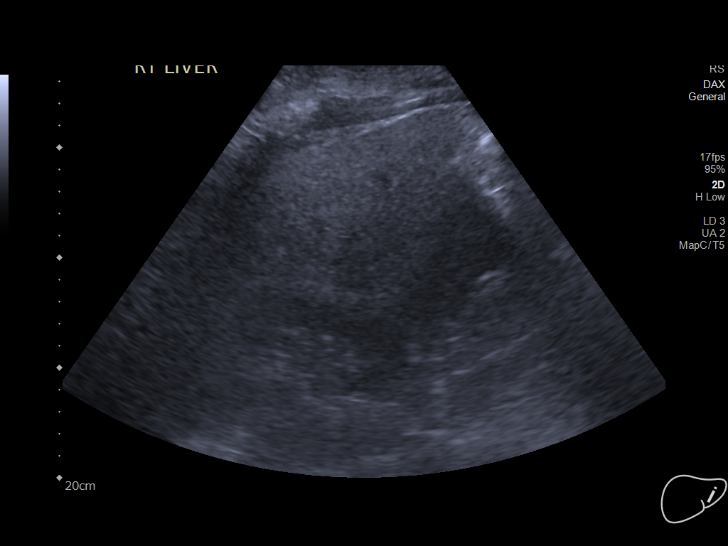
[im 12/27]
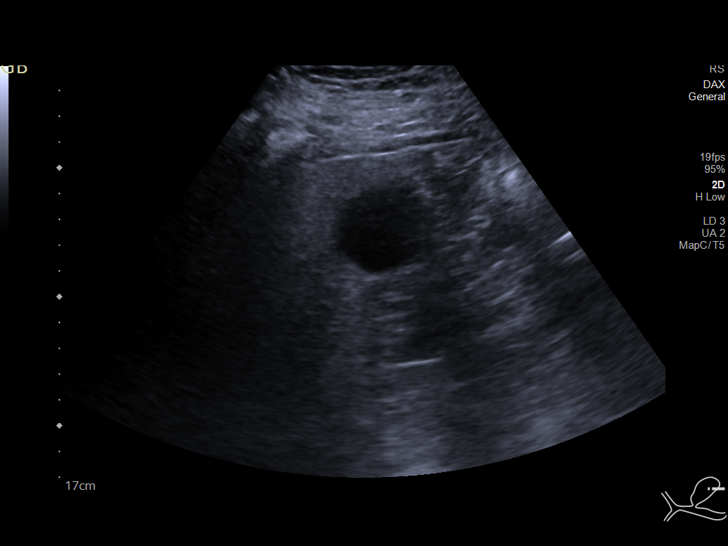
[im 15/27]
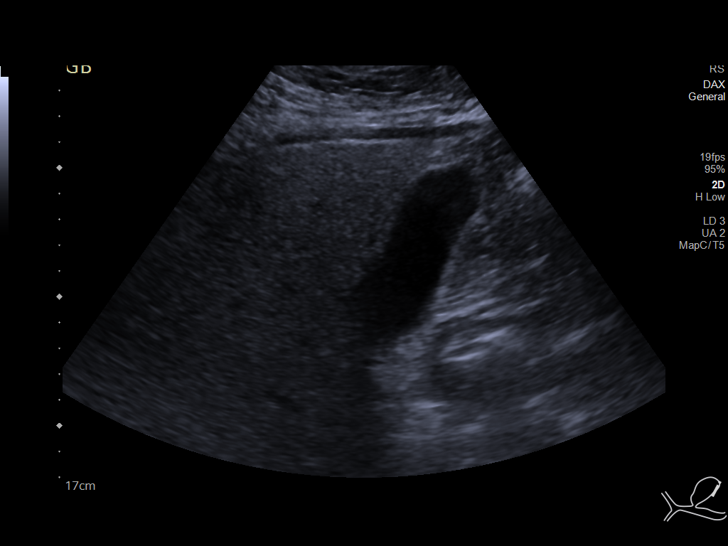
[im 17/27]
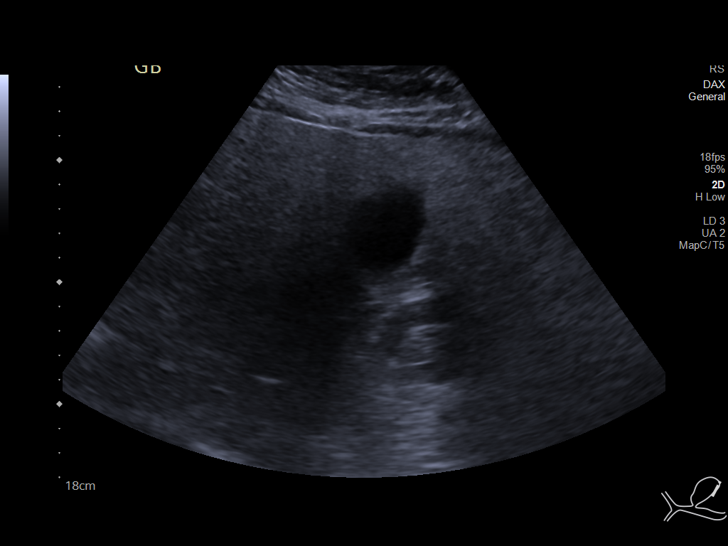
[im 18/27]
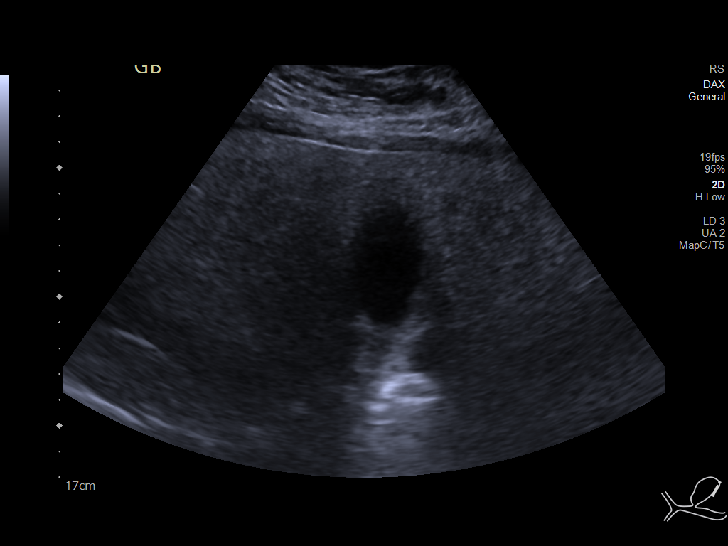
[im 20/27]
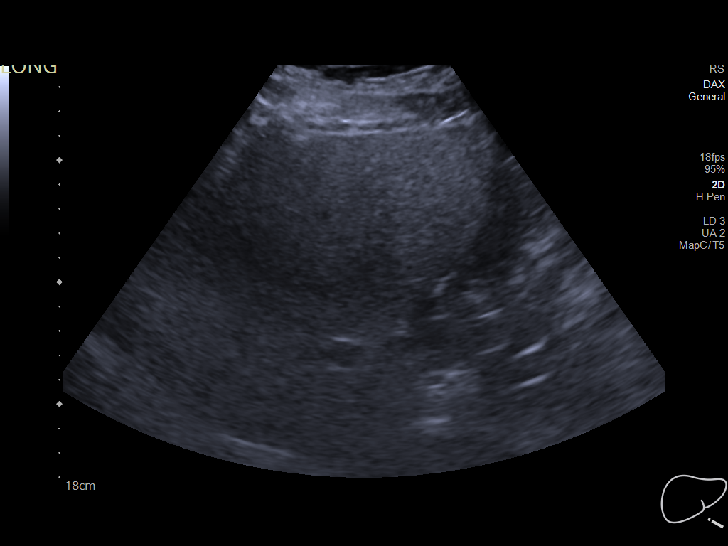
[im 22/27]
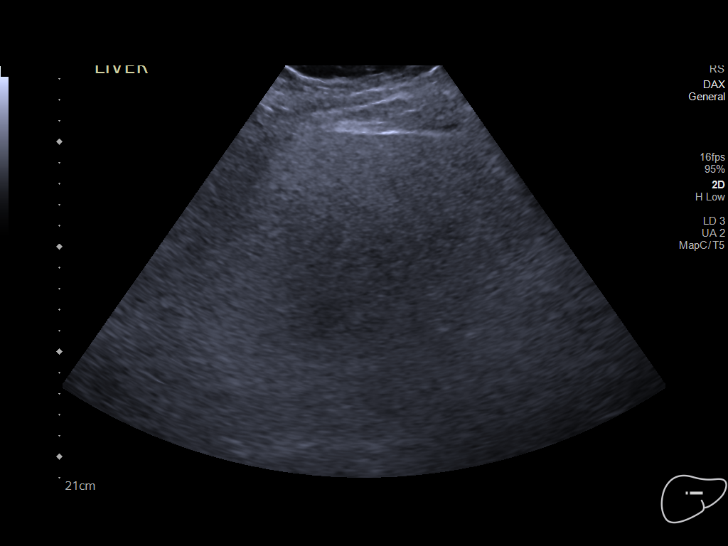
[im 24/27]
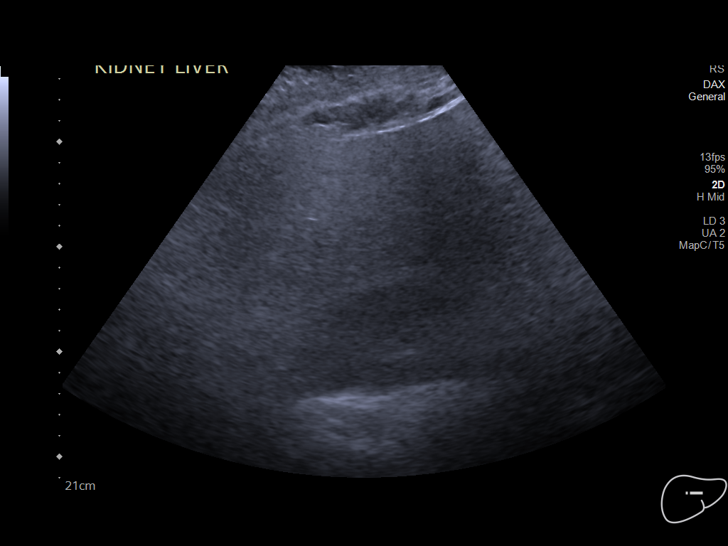
[im 27/27]
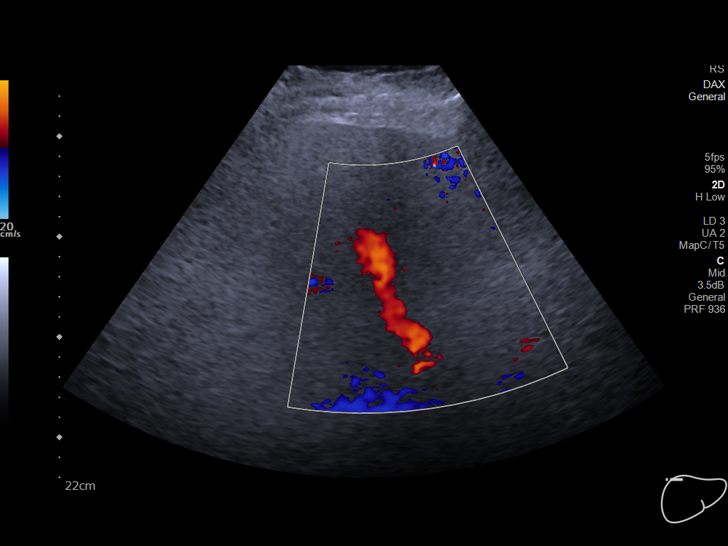

[14 of 25 positions shown; findings below may reference images not displayed]

FINDINGS: Gallbladder:

No gallstones or wall thickening visualized. No sonographic Murphy
sign noted by sonographer.

Common bile duct:

Diameter: Unable to visualize due to bowel gas and habitus.

Liver:

Diffusely echogenic. Left lobe not well evaluated. No definite focal
hepatic abnormality. Portal vein is patent on color Doppler imaging
with normal direction of blood flow towards the liver.

Other: None.
IMPRESSION: 1. Diffusely echogenic liver consistent with hepatic steatosis and
or hepatocellular disease.
2. Unable to visualize common bile duct due to habitus and gas.

## 2022-02-18 IMAGING — DX DG CHEST 1V PORT
1 series · 1 of 1 positions shown · non-contrast
Comparison: Two days ago

CLINICAL DATA: Community acquired pneumonia

EXAM:
PORTABLE CHEST 1 VIEW

[chest ap]
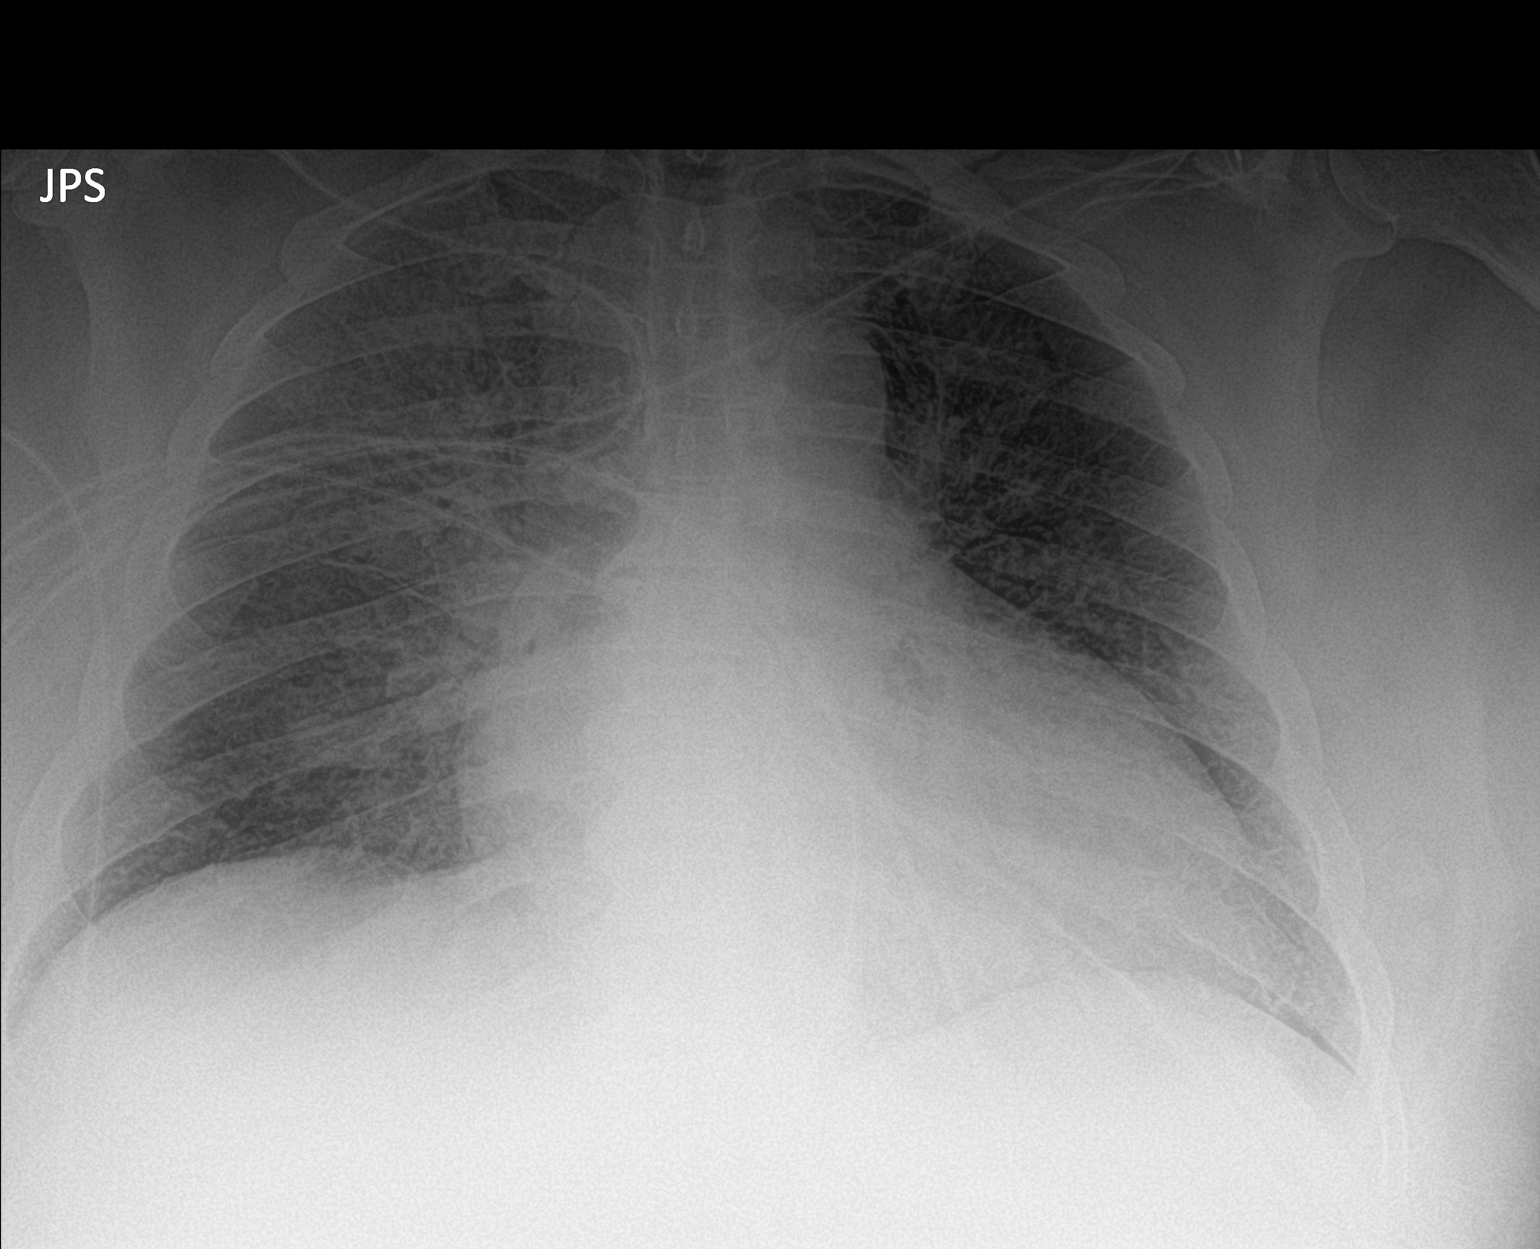

[1 of 1 positions shown; findings below may reference images not displayed]

FINDINGS: Diffuse interstitial prominence that appears increased. There is
asymmetric hazy right chest density correlating with airspace
disease on chest CT from 4 days ago. Cardiopericardial enlargement.
No visible effusion or air leak
IMPRESSION: 1. Mildly improved airspace disease.
2. Increased interstitial prominence which may be interval vascular
congestion.

## 2022-02-18 IMAGING — DX DG CHEST 1V PORT
1 series · 1 of 1 positions shown · non-contrast
Comparison: 05/09/2020

CLINICAL DATA: Central line placement

EXAM:
PORTABLE CHEST 1 VIEW

[chest ap]
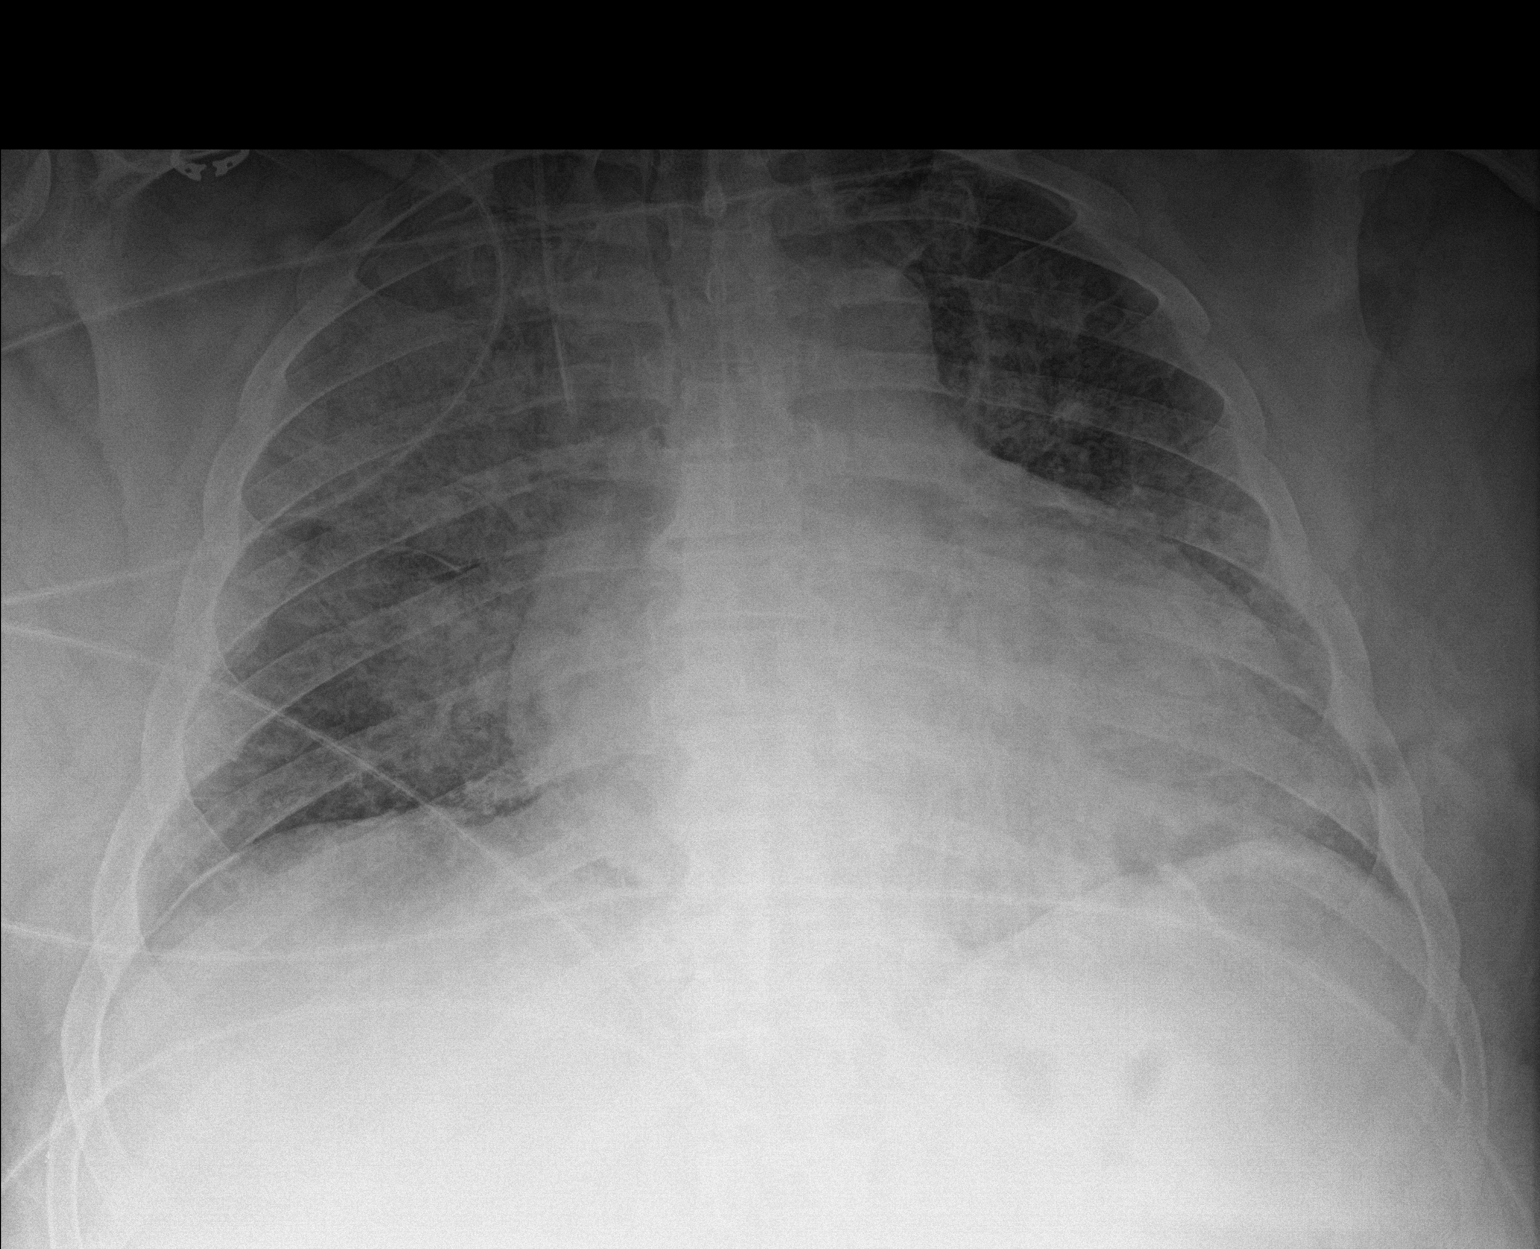

[1 of 1 positions shown; findings below may reference images not displayed]

FINDINGS: Right internal jugular central venous catheter has been placed with
its tip overlying the superior vena cava. Multifocal pulmonary
infiltrate, more severe within the right upper lobe, is likely
stable when accounting for changes in radiographic technique. No
pneumothorax or pleural effusion. Mild cardiomegaly is stable. No
acute bone abnormality.
IMPRESSION: Right internal jugular central venous catheter tip within the
superior vena cava. No pneumothorax.

Stable pulmonary infiltrates.

## 2022-02-18 MED ORDER — SEMAGLUTIDE (1 MG/DOSE) 4 MG/3ML ~~LOC~~ SOPN: 1.0000 mg | PEN_INJECTOR | SUBCUTANEOUS | 11 refills | Status: DC

## 2022-02-18 NOTE — Telephone Encounter (Signed)
Pt cant get Ozempic 2. Will decrease it to 1mg  for the mean time until [facility] can get in stock.

## 2022-02-18 NOTE — Addendum Note (Signed)
Addended by: Malena Peer D on: 02/18/2022 02:27 PM   Modules accepted: Orders

## 2022-02-18 NOTE — Telephone Encounter (Signed)
ID: [id number]  Steven Burnett [id number] PCN ADV GRP: [id number]  Pt needs PA for Ozempic done Submitted to new insurance

## 2022-02-19 NOTE — Telephone Encounter (Signed)
Called pt and LVM that PA was approved.

## 2022-02-19 NOTE — Telephone Encounter (Signed)
Ozempic approved through 02/19/23

## 2022-04-07 ENCOUNTER — Other Ambulatory Visit (HOSPITAL_COMMUNITY): Payer: Self-pay

## 2022-04-07 MED ORDER — EMPAGLIFLOZIN 10 MG PO TABS: 10.0000 mg | ORAL_TABLET | Freq: Every day | ORAL | 3 refills | Status: AC

## 2022-04-07 NOTE — Addendum Note (Signed)
Addended by: Marcelle Overlie D on: 04/07/2022 04:54 PM   Modules accepted: Orders

## 2022-04-07 NOTE — Telephone Encounter (Signed)
PA for Jardiance approved through 04/08/23. Left VM (per DPR) that it has been approved.

## 2022-04-08 ENCOUNTER — Other Ambulatory Visit (HOSPITAL_COMMUNITY): Payer: Self-pay

## 2022-04-15 ENCOUNTER — Encounter (HOSPITAL_COMMUNITY): Payer: BC Managed Care – PPO | Admitting: Cardiology

## 2022-04-16 ENCOUNTER — Telehealth: Payer: Self-pay | Admitting: Pharmacist

## 2022-04-16 NOTE — Telephone Encounter (Signed)
Patient called stating his blood pressure has been very low  84/58  116/67 HR 62 104/59 89/48 98/55  94/49 85/51 89/51  104/58 91/55 100/57 Dizziness when he bends down or is seated for awhile Sometimes pee is darker yellow Has been taking torsemide daily instead of as needed. I have advised him only to take torsemide if he has gained 3lb overnight or 5lb in a week. Call me if BP does not improve.

## 2022-04-18 ENCOUNTER — Other Ambulatory Visit (HOSPITAL_COMMUNITY): Payer: Self-pay | Admitting: Cardiology

## 2022-04-21 MED ORDER — OZEMPIC (2 MG/DOSE) 8 MG/3ML ~~LOC~~ SOPN: 2.0000 mg | PEN_INJECTOR | SUBCUTANEOUS | 0 refills | Status: DC

## 2022-04-21 NOTE — Addendum Note (Signed)
Addended by: Marcelle Overlie D on: 04/21/2022 12:31 PM   Modules accepted: Orders

## 2022-04-22 ENCOUNTER — Encounter: Payer: Self-pay | Admitting: Pharmacist

## 2022-04-22 ENCOUNTER — Telehealth: Payer: Self-pay | Admitting: Pharmacist

## 2022-04-22 DIAGNOSIS — E1100 Type 2 diabetes mellitus with hyperosmolarity without nonketotic hyperglycemic-hyperosmolar coma (NKHHC): Secondary | ICD-10-CM

## 2022-04-22 MED ORDER — OZEMPIC (1 MG/DOSE) 4 MG/3ML ~~LOC~~ SOPN: 1.0000 mg | PEN_INJECTOR | SUBCUTANEOUS | 2 refills | Status: DC

## 2022-04-22 NOTE — Telephone Encounter (Signed)
Rx for Ozempic sent to [facility]

## 2022-07-04 ENCOUNTER — Other Ambulatory Visit: Payer: Self-pay | Admitting: Pharmacist

## 2022-07-04 ENCOUNTER — Other Ambulatory Visit: Payer: Self-pay | Admitting: Cardiology

## 2022-07-04 DIAGNOSIS — E1100 Type 2 diabetes mellitus with hyperosmolarity without nonketotic hyperglycemic-hyperosmolar coma (NKHHC): Secondary | ICD-10-CM

## 2022-07-04 MED ORDER — OZEMPIC (1 MG/DOSE) 4 MG/3ML ~~LOC~~ SOPN: 1.0000 mg | PEN_INJECTOR | SUBCUTANEOUS | 5 refills | Status: DC

## 2022-07-30 ENCOUNTER — Telehealth: Payer: Self-pay | Admitting: Pharmacist

## 2022-07-30 DIAGNOSIS — E1100 Type 2 diabetes mellitus with hyperosmolarity without nonketotic hyperglycemic-hyperosmolar coma (NKHHC): Secondary | ICD-10-CM

## 2022-07-30 NOTE — Telephone Encounter (Signed)
Pt has new insurance and another PA is needed for his Ozempic per his pharmacy.  PA has been submitted, Key: [id number]

## 2022-07-30 NOTE — Telephone Encounter (Addendum)
Prior auth denied. There are no records of A1c at least 6.5% in Epic, Care Everywhere, or [facility] even though pt has dx of T2DM listed on his chart. They will not cover without records A1c at least 6.5%. A1c has been 5.3-5.4% since he has been taking Ozempic for a year.  Will see if his insurance will cover Wegovy instead. He used to take this on a different insurance plan, then had ot change to Ozempic when he had changed insurance again. Now on 3rd insurance in the last 12 months and coverage has changed.  Wegovy not covered by plan.  Left message for pt. Can see if he has any old records of A1c in diabetic range needed for Ozempic coverage with his new insurance plan.

## 2022-07-30 NOTE — Telephone Encounter (Signed)
Received a phone call from pt insurance for an urgent review of Jardiance. Clinical questions answered. Jardiance approved through 07/30/23.

## 2022-08-01 NOTE — Telephone Encounter (Signed)
Spoke with pt. States he's had DM for 10-12 years but doesn't have any lab records of A1c in diabetic range. Called his PCP office at [facility]. They looked back through their records and found an A1c of 6.5% from 11/01/13. I will forward additional information related to pt's case to [phone] before an appeals is submitted. This has been done along with below required information.  Name: Steven Burnett, DOB 09/09/1960 Reference number: [id number] Med: Ozempic 1mg  Date of service: 0/35/46 Member policy #: [id number]  Pt aware this is in process.

## 2022-08-01 NOTE — Telephone Encounter (Signed)
Got message from CHF team that pt called them about his Ozempic. Called pt back again and left him another voicemail.

## 2022-08-04 MED ORDER — OZEMPIC (1 MG/DOSE) 4 MG/3ML ~~LOC~~ SOPN: 1.0000 mg | PEN_INJECTOR | SUBCUTANEOUS | 5 refills | Status: DC

## 2022-08-04 NOTE — Telephone Encounter (Signed)
Ozempic approved until 08/01/23. Called pt. No answer, LVM

## 2022-08-05 NOTE — Telephone Encounter (Signed)
Spoke with pt, he was very appreciative for the help.

## 2022-11-20 ENCOUNTER — Other Ambulatory Visit: Payer: Self-pay | Admitting: Cardiology

## 2023-01-14 ENCOUNTER — Other Ambulatory Visit (HOSPITAL_COMMUNITY): Payer: Self-pay | Admitting: Cardiology

## 2023-01-14 DIAGNOSIS — I5022 Chronic systolic (congestive) heart failure: Secondary | ICD-10-CM

## 2023-01-29 NOTE — Progress Notes (Signed)
PCP: Dr Olena Leatherwood Cardiology: Dr Shirlee Latch   HPI: Steven Burnett is a 62 y.o. obese male, former smoker, with h/o CAD w/ remote MI and coronary stenting (done at outside facility, details unknown), OSA w/ reported compliance w/ CPAP, h/o provoked DVT following knee surgery, T2DM, HTN, HLD, PAF, and chronic systolic heart failure.   Admitted 05/13/20 for acute hypoxic respiratory failure due to acute CHF>>cardiogenic shock, atrial flutter w/ RVR and ? PNA. Prior to admit he was drinking 48 ounces of alcohol 3-4 days a week. Echo in 10/21 showed EF < 20%, moderately decreased RV systolic function. Briefly on milrinone but weaned off.  Placed on amiodarone drip and chemically converted to NSR. Started GDMT.  Had cath that showed good cardiac output and distal coronary disease, no targets for intervention. Discharge weight 269 pounds.   Echo 2/22 showed EF 40% with normal RV and normal IVC.  He had atrial flutter ablation in 2/22.    Echo in 1/23 showed EF 50% with global hypokinesis, normal RV.   Echo 7/23 EF 45-50%, GHK of LV, GIDD, normal RV.   Today he returns for AHF follow up. Overall feeling good. Denies palpitations, CP, edema, or PND/Orthopnea. No SOB. Appetite good. Dizziness sometimes when he bends over and stands up quickly. No fever or chills. Weight at home 230 pounds. Taking all medications. No ETOH, does vape. Works full time in a [employer] business. Using CPAP. Uses Torsemide PRN, last took yesterday. SBP at home 80-90s.   Echo today: read pending  ECG (personally reviewed): NSR 60s   Labs (12/21): LDL 67, K 4, creatinine 1.48 Labs (4/22): K 4.2, creatinine 1.5 Labs (5/22): K 3.9, creatinine 1.19 Labs (9/22): K 4.1, creatinine 1.49, LDL 44 Labs (1/23): K 4.5, creatinine 1.36, LDL 58 Labs (9/23): K 4.3, SCr 1.34. Labs (6/24): K 4.8, SCr 1.46  PMH: 1. OSA: Uses CPAP.  2. H/o DVT: Provoked, after knee surgery.  3. Type 2 diabetes. 4. CKD: Stage 3, possible diabetic nephropathy.   5. Atrial flutter: noted in 10/21, converted to NSR on amiodarone.  - Atrial flutter ablation in 2/22.  6. CAD: PCI in the remote past.   - LHC (10/21): 80% far distal LAD, patent OM1 stent, 60% dRCA, 50% proximal PLV, 80% mid to distal PDA. No interventional target.  7. 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  - Echo (10/21): EF <20%, moderately decreased RV systolic function.  - RHC (10/21): mean RA 8, PA 40/23, mean PCWP 21. CI 2.4.  - Echo (2/22): EF 40%, normal RV, normal IVC.  - Echo (1/23): EF 50% with global hypokinesis, normal RV.  - Echo 7/23 EF 45-50%, GHK of LV, GIDD, normal RV.   ROS: All systems negative except as listed in HPI, PMH and Problem List.  SH:  Social History   Socioeconomic History   Marital status: Married    Spouse name: Not on file   Number of children: Not on file   Years of education: Not on file   Highest education level: Not on file  Occupational History   Not on file  Tobacco Use   Smoking status: Former   Smokeless tobacco: Never   Tobacco comments:    Quit in 2014  Substance and Sexual Activity   Alcohol use: Not Currently    Comment: Former, quit in 2014   Drug use: Never   Sexual activity: Not on file  Other Topics Concern   Not  on file  Social History Narrative   Not on file   Social Determinants of Health   Financial Resource Strain: Not on file  Food Insecurity: Not on file  Transportation Needs: Not on file  Physical Activity: Not on file  Stress: Not on file  Social Connections: Not on file  Intimate Partner Violence: Not on file    FH:  Family History  Problem Relation Age of Onset   Kidney failure Son        s/p transplant   Stroke Mother      Current Outpatient Medications  Medication Sig Dispense Refill   acetaminophen (TYLENOL) 325 MG tablet Take 325-650 mg by mouth every 6 (six) hours as needed for mild pain or  headache.     allopurinol (ZYLOPRIM) 300 MG tablet Take 300 mg by mouth daily.     ALPRAZolam (XANAX) 1 MG tablet Take 1 mg by mouth 3 (three) times daily.      atorvastatin (LIPITOR) 80 MG tablet TAKE 1 TABLET BY MOUTH EVERY DAY 30 tablet 5   carvedilol (COREG) 12.5 MG tablet TAKE 1 TABLET BY MOUTH 2 TIMES DAILY. 44 tablet 28   CREON 24000-76000 units CPEP Take 1 capsule by mouth 3 (three) times daily before meals.      diphenhydrAMINE (BENADRYL) 25 mg capsule Take 25 mg by mouth every 6 (six) hours as needed for allergies.     docusate sodium (COLACE) 100 MG capsule Take 100 mg by mouth in the morning, at noon, and at bedtime. As needed     ELIQUIS 5 MG TABS tablet TAKE 1 TABLET BY MOUTH TWICE A DAY 60 tablet 5   empagliflozin (JARDIANCE) 10 MG TABS tablet Take 1 tablet (10 mg total) by mouth daily before breakfast. 90 tablet 3   fenofibrate (TRICOR) 145 MG tablet Take 145 mg by mouth daily.      Omega-3 Fatty Acids (FISH OIL) 1200 MG CAPS Take 2 capsules by mouth 3 (three) times daily.     pantoprazole (PROTONIX) 40 MG tablet Take 40 mg by mouth daily.     sacubitril-valsartan (ENTRESTO) 49-51 MG Take 1 tablet by mouth 2 (two) times daily. 180 tablet 3   Semaglutide, 1 MG/DOSE, (OZEMPIC, 1 MG/DOSE,) 4 MG/3ML SOPN Inject 1 mg into the skin once a week. 3 mL 5   sertraline (ZOLOFT) 100 MG tablet Take 50 mg by mouth at bedtime.     spironolactone (ALDACTONE) 25 MG tablet Take 1 tablet (25 mg total) by mouth daily. 90 tablet 1   tadalafil (CIALIS) 5 MG tablet Take 5 mg by mouth daily as needed for erectile dysfunction.     torsemide (DEMADEX) 20 MG tablet Take 10 mg by mouth as needed.     No current facility-administered medications for this encounter.    Vitals:   02/05/23 1508  BP: 99/68  Pulse: 64  SpO2: 97%  Weight: 106 kg (233 lb 9.6 oz)    Wt Readings from Last 3 Encounters:  02/05/23 106 kg (233 lb 9.6 oz)  12/10/21 118.4 kg (261 lb)  08/13/21 129.2 kg (284 lb 12.8 oz)    PHYSICAL EXAM: General:  well appearing.  No respiratory difficulty. Walked into clinic HEENT: normal Neck: supple. JVD ~6 cm. Carotids 2+ bilat; no bruits. No lymphadenopathy or thyromegaly appreciated. Cor: PMI nondisplaced. Regular rate & rhythm. No rubs, gallops or murmurs. Lungs: clear Abdomen: soft, nontender, nondistended. No hepatosplenomegaly. No bruits or masses. Good bowel sounds. Extremities: no cyanosis, clubbing,  rash, edema  Neuro: alert & oriented x 3, cranial nerves grossly intact. moves all 4 extremities w/o difficulty. Affect pleasant.   ASSESSMENT & PLAN: 1. Chronic systolic CHF: Echo 04/2020 EF < 20%, moderate RV dysfunction.  He was in atrial flutter with RVR at admission, not sure how long this was present prior.  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 Coronary angiography showed primarily distal vessel CAD, no intervention.  Echo in 2/22 showed EF up to about 40%.  Echo in 1/23 showed EF up to 50%, global hypokinesis. Echo 7/23 EF 45-50%, GHK of LV, GIDD, normal RV. NYHA class I-II symptoms.  He is not volume overloaded on exam.   - echo today, read pending - Continue torsemide as needed.  - Continue Coreg 12.5 mg bid.   - Decrease Entresto 49/51>24/26 bid with dizziness.  Suspect 2/2 weight loss and not enough fluid intake.  - Continue spironolactone 25 mg daily.   - Continue Jardiance 10 mg daily.     - BMET/BNP today.  2. Atrial flutter:  Admitted in atrial flutter with RVR on 10/21 admit. 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 Chemically converted to NSR with IV amiodarone.  Atrial flutter ablation in 2/22.  Now off amiodarone.      - Continue Eliquis 5 mg twice a day. CBC today.  3. CAD: Remote history of CAD/PCI apparently in [place].  No chest pain.  HS-TnI mildly elevated, suspect demand ischemia with CHF/cardiogenic shock.  Coronary angiography in 10/21 with distal coronary disease, no target for intervention.  -  Continue statin, LDL 42 6/24.  - No ASA given apixaban use.  4. H/o DVT: Prior, provoked by surgery.  On Eliquis.  5. CKD Stage III: Creatinine 1.46 on last check, BMET today.  6. OSA: Continue CPAP every night.    7. Obesity: Body mass index is 35.52 kg/m.  - Weight coming down with semaglutide.   Follow up in 3 months with Dr. Shirlee Latch.   Alen Bleacher AGACNP-BC  02/05/2023

## 2023-02-05 ENCOUNTER — Ambulatory Visit (HOSPITAL_COMMUNITY)
Admission: RE | Admit: 2023-02-05 | Discharge: 2023-02-05 | Disposition: A | Discharge status: Home / Self Care | Payer: BLUE CROSS/BLUE SHIELD | Source: Ambulatory Visit | Attending: Internal Medicine | Admitting: Internal Medicine

## 2023-02-05 ENCOUNTER — Encounter (HOSPITAL_COMMUNITY): Payer: Self-pay

## 2023-02-05 VITALS — BP 99/68 | HR 64 | Wt 233.6 lb

## 2023-02-05 DIAGNOSIS — Z6835 Body mass index (BMI) 35.0-35.9, adult: Secondary | ICD-10-CM | POA: Diagnosis not present

## 2023-02-05 DIAGNOSIS — Z7984 Long term (current) use of oral hypoglycemic drugs: Secondary | ICD-10-CM | POA: Insufficient documentation

## 2023-02-05 DIAGNOSIS — I5022 Chronic systolic (congestive) heart failure: Secondary | ICD-10-CM | POA: Diagnosis present

## 2023-02-05 DIAGNOSIS — G4733 Obstructive sleep apnea (adult) (pediatric): Secondary | ICD-10-CM | POA: Insufficient documentation

## 2023-02-05 DIAGNOSIS — E669 Obesity, unspecified: Secondary | ICD-10-CM | POA: Diagnosis not present

## 2023-02-05 DIAGNOSIS — I2510 Atherosclerotic heart disease of native coronary artery without angina pectoris: Secondary | ICD-10-CM

## 2023-02-05 DIAGNOSIS — I4892 Unspecified atrial flutter: Secondary | ICD-10-CM

## 2023-02-05 DIAGNOSIS — Z86718 Personal history of other venous thrombosis and embolism: Secondary | ICD-10-CM

## 2023-02-05 DIAGNOSIS — I130 Hypertensive heart and chronic kidney disease with heart failure and stage 1 through stage 4 chronic kidney disease, or unspecified chronic kidney disease: Secondary | ICD-10-CM | POA: Diagnosis not present

## 2023-02-05 DIAGNOSIS — Z7901 Long term (current) use of anticoagulants: Secondary | ICD-10-CM | POA: Diagnosis not present

## 2023-02-05 DIAGNOSIS — E1122 Type 2 diabetes mellitus with diabetic chronic kidney disease: Secondary | ICD-10-CM | POA: Diagnosis not present

## 2023-02-05 DIAGNOSIS — N183 Chronic kidney disease, stage 3 unspecified: Secondary | ICD-10-CM

## 2023-02-05 DIAGNOSIS — Z87891 Personal history of nicotine dependence: Secondary | ICD-10-CM | POA: Diagnosis present

## 2023-02-05 LAB — BASIC METABOLIC PANEL
Anion gap: 7 (ref 5–15)
BUN: 21 mg/dL — ABNORMAL HIGH (ref 6–20)
CO2: 28 mmol/L (ref 22–32)
Calcium: 9.3 mg/dL (ref 8.9–10.3)
Chloride: 106 mmol/L (ref 98–111)
Creatinine, Ser: 1.4 mg/dL — ABNORMAL HIGH (ref 0.61–1.24)
GFR, Estimated: 58 mL/min — ABNORMAL LOW (ref >60)
Glucose, Bld: 93 mg/dL (ref 70–99)
Potassium: 4.4 mmol/L (ref 3.5–5.1)
Sodium: 141 mmol/L (ref 135–145)

## 2023-02-05 LAB — ECHOCARDIOGRAM COMPLETE
Area-P 1/2: 3.08 cm2
Calc EF: 47.4 %
S' Lateral: 3.2 cm
Single Plane A2C EF: 52.9 %
Single Plane A4C EF: 42.3 %

## 2023-02-05 LAB — CBC
HCT: 41.9 % (ref 39.0–52.0)
Hemoglobin: 13.7 g/dL (ref 13.0–17.0)
MCH: 29.1 pg (ref 26.0–34.0)
MCHC: 32.7 g/dL (ref 30.0–36.0)
MCV: 89.1 fL (ref 80.0–100.0)
Platelets: 213 10*3/uL (ref 150–400)
RBC: 4.7 MIL/uL (ref 4.22–5.81)
RDW: 13.7 % (ref 11.5–15.5)
WBC: 4.9 10*3/uL (ref 4.0–10.5)
nRBC: 0 % (ref 0.0–0.2)

## 2023-02-05 LAB — BRAIN NATRIURETIC PEPTIDE: B Natriuretic Peptide: 29.3 pg/mL (ref 0.0–100.0)

## 2023-02-05 MED ORDER — ENTRESTO 24-26 MG PO TABS: 1.0000 | ORAL_TABLET | Freq: Two times a day (BID) | ORAL | 3 refills | Status: DC

## 2023-02-05 NOTE — Patient Instructions (Addendum)
Medication Changes:  Decrease Entresto to 24-26 mg BID    *If you need a refill on your cardiac medications before your next appointment, please call your pharmacy*  Lab Work:  Labs done today, your results will be available in MyChart, we will contact you for abnormal readings.  Follow-Up in:   Your physician recommends that you schedule a follow-up appointment in: 3 Months with Dr. Shirlee Latch.   Do the following things EVERYDAY: Weigh yourself in the morning before breakfast. Write it down and keep it in a log. Take your medicines as prescribed Eat low salt foods--Limit salt (sodium) to 2000 mg per day.  Stay as active as you can everyday Limit all fluids for the day to less than 2 liters    Need to Contact us:  If you have any questions or concerns before your next appointment please send [place] a message through [place] or call our office at [phone].    TO LEAVE A MESSAGE FOR THE NURSE SELECT OPTION 2, PLEASE LEAVE A MESSAGE INCLUDING: YOUR NAME DATE OF BIRTH CALL BACK NUMBER REASON FOR CALL**this is important as we prioritize the call backs  YOU WILL RECEIVE A CALL BACK THE SAME DAY AS LONG AS YOU CALL BEFORE 4:00 PM   At the [facility], you and your health needs are our priority. As part of our continuing mission to provide you with exceptional heart care, we have created designated Provider Care Teams. These Care Teams include your primary Cardiologist (physician) and Advanced Practice Providers (APPs- Physician Assistants and Nurse Practitioners) who all work together to provide you with the care you need, when you need it.   You may see any of the following providers on your designated Care Team at your next follow up: Dr Arvilla Meres Dr Marca Ancona Dr. Marcos Eke, NP Robbie Lis, [place] [facility] [place], [place] Brynda Peon, NP Karle Plumber, PharmD   Please be sure to bring in all your medications bottles to  every appointment.    Thank you for choosing [facility]

## 2023-02-05 NOTE — Progress Notes (Signed)
Echocardiogram 2D Echocardiogram has been performed.  Steven Burnett 02/05/2023, 3:06 PM

## 2023-02-09 ENCOUNTER — Other Ambulatory Visit (HOSPITAL_COMMUNITY): Payer: Self-pay

## 2023-03-06 ENCOUNTER — Other Ambulatory Visit: Payer: Self-pay | Admitting: Cardiology

## 2023-03-06 DIAGNOSIS — E1100 Type 2 diabetes mellitus with hyperosmolarity without nonketotic hyperglycemic-hyperosmolar coma (NKHHC): Secondary | ICD-10-CM

## 2023-07-10 ENCOUNTER — Other Ambulatory Visit: Payer: Self-pay | Admitting: Cardiology

## 2023-07-10 DIAGNOSIS — E1100 Type 2 diabetes mellitus with hyperosmolarity without nonketotic hyperglycemic-hyperosmolar coma (NKHHC): Secondary | ICD-10-CM

## 2023-07-13 ENCOUNTER — Other Ambulatory Visit: Payer: Self-pay | Admitting: Pharmacist

## 2023-07-30 ENCOUNTER — Telehealth: Payer: Self-pay | Admitting: Pharmacist

## 2023-07-30 DIAGNOSIS — E1100 Type 2 diabetes mellitus with hyperosmolarity without nonketotic hyperglycemic-hyperosmolar coma (NKHHC): Secondary | ICD-10-CM

## 2023-07-30 NOTE — Telephone Encounter (Signed)
 Received VM from patient that his lipase is elevated and he was told to go down to 1mg  of ozmepic. I requested that he have blood work faxed to us . May need to consider stopping all together as he does have a hx of pancreatitis. Called pt and LVM for him to call back

## 2023-08-04 ENCOUNTER — Other Ambulatory Visit (HOSPITAL_COMMUNITY): Payer: Self-pay | Admitting: Cardiology

## 2023-08-06 NOTE — Telephone Encounter (Signed)
I called and spoke with patient. Reviewed labs that have been scanned in. Amylase 164 and lipase 238. 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 He has decreased his Ozempic to 1mg  from 2mg . His PCP thinks he should stop. He lost >50lb and is concerned about gaining the weight back.  He takes creon due to pancreatic insufficiency.  I advised that I agreed with PCP that its best he stop the ozempic.  He will have his levels rechecked with PCP once he has been off of ozempic a little while.

## 2023-08-11 ENCOUNTER — Telehealth: Payer: Self-pay | Admitting: Pharmacist

## 2023-08-11 ENCOUNTER — Telehealth: Payer: Self-pay | Admitting: Pharmacy Technician

## 2023-08-11 ENCOUNTER — Other Ambulatory Visit (HOSPITAL_COMMUNITY): Payer: Self-pay

## 2023-08-11 NOTE — Telephone Encounter (Signed)
Pharmacy Patient Advocate Encounter   Received notification from Pt Calls Messages that prior authorization for Entresto 24-26MG  tablets is required/requested.   Insurance verification completed.   The patient is insured through [employer] .   Per test claim: PA required; PA submitted to above mentioned insurance via CoverMyMeds Key/confirmation #/EOC [id number] Status is pending

## 2023-08-11 NOTE — Telephone Encounter (Signed)
Pt LVM, need to submit PA for [facility] request sent to the pool.

## 2023-08-11 NOTE — Telephone Encounter (Signed)
PA request has been Submitted. New Encounter created for follow up. For additional info see Pharmacy Prior Auth telephone encounter from 08/11/23.

## 2023-08-18 NOTE — Telephone Encounter (Signed)
Pharmacy Patient Advocate Encounter  Received notification from [facility] that Prior Authorization for  Entresto 24-26MG  tablets  has been APPROVED from 08/11/23 to 08/10/24. Spoke to pharmacy to process.Copay is $10.00 FOR 90 DAYS.    PA #/Case ID/Reference #: [id number]

## 2023-08-21 NOTE — Telephone Encounter (Signed)
 Call to inform patient, N/A LVM

## 2024-02-12 ENCOUNTER — Encounter (HOSPITAL_COMMUNITY): Admitting: Cardiology

## 2024-03-30 ENCOUNTER — Telehealth (HOSPITAL_COMMUNITY): Payer: Self-pay | Admitting: Cardiology

## 2024-03-30 NOTE — Telephone Encounter (Signed)
 Called to confirm/remind patient of their appointment at the [facility] on 03/30/24.   Appointment:   [x] Confirmed  [] Left mess   [] No answer/No voice mail  [] VM Full/unable to leave message  [] Phone not in service  Patient reminded to bring all medications and/or complete list.  Confirmed patient has transportation. Gave directions, instructed to utilize valet parking.

## 2024-03-31 ENCOUNTER — Inpatient Hospital Stay (HOSPITAL_COMMUNITY): Admission: RE | Admit: 2024-03-31 | Source: Ambulatory Visit | Admitting: Cardiology

## 2024-04-21 ENCOUNTER — Telehealth: Payer: Self-pay | Admitting: Pharmacist

## 2024-04-21 MED ORDER — SACUBITRIL-VALSARTAN 24-26 MG PO TABS: 1.0000 | ORAL_TABLET | Freq: Two times a day (BID) | ORAL | 1 refills | Status: AC

## 2024-04-21 NOTE — Telephone Encounter (Signed)
 Patient called.  Needs new Entresto  prescription sent to [facility].  Also states that his last fill of Entresto  cost him over $100, that was for the 49/51 as he did not realize it filled the wrong dose.  Since his generic he could not use the coupon.  I did advise that I will send a new prescription for the 24/26. Advised that he can get a 90 count of the 24/26 Entresto  for $59 +5 dollars [facility] if it cost him a lot more at [facility].  He will let me know.

## 2024-06-30 ENCOUNTER — Ambulatory Visit (HOSPITAL_COMMUNITY): Payer: Self-pay | Admitting: Cardiology

## 2024-06-30 ENCOUNTER — Ambulatory Visit (HOSPITAL_COMMUNITY): Admission: RE | Admit: 2024-06-30 | Discharge: 2024-06-30 | Attending: Cardiology | Admitting: Cardiology

## 2024-06-30 ENCOUNTER — Encounter (HOSPITAL_COMMUNITY): Payer: Self-pay | Admitting: Cardiology

## 2024-06-30 VITALS — BP 118/80 | HR 63 | Ht 68.0 in | Wt 261.8 lb

## 2024-06-30 DIAGNOSIS — I5022 Chronic systolic (congestive) heart failure: Secondary | ICD-10-CM | POA: Diagnosis not present

## 2024-06-30 LAB — CBC
HCT: 43.6 % (ref 39.0–52.0)
Hemoglobin: 14.4 g/dL (ref 13.0–17.0)
MCH: 29.6 pg (ref 26.0–34.0)
MCHC: 33 g/dL (ref 30.0–36.0)
MCV: 89.7 fL (ref 80.0–100.0)
Platelets: 193 K/uL (ref 150–400)
RBC: 4.86 MIL/uL (ref 4.22–5.81)
RDW: 12.5 % (ref 11.5–15.5)
WBC: 5.3 K/uL (ref 4.0–10.5)
nRBC: 0 % (ref 0.0–0.2)

## 2024-06-30 LAB — BASIC METABOLIC PANEL WITH GFR
Anion gap: 6 (ref 5–15)
BUN: 24 mg/dL — ABNORMAL HIGH (ref 8–23)
CO2: 28 mmol/L (ref 22–32)
Calcium: 8.7 mg/dL — ABNORMAL LOW (ref 8.9–10.3)
Chloride: 104 mmol/L (ref 98–111)
Creatinine, Ser: 1.47 mg/dL — ABNORMAL HIGH (ref 0.61–1.24)
GFR, Estimated: 54 mL/min — ABNORMAL LOW (ref >60)
Glucose, Bld: 96 mg/dL (ref 70–99)
Potassium: 4.2 mmol/L (ref 3.5–5.1)
Sodium: 138 mmol/L (ref 135–145)

## 2024-06-30 LAB — LIPID PANEL
Cholesterol: 99 mg/dL (ref 0–200)
HDL: 31 mg/dL — ABNORMAL LOW (ref >40)
LDL Cholesterol: 41 mg/dL (ref 0–99)
Total CHOL/HDL Ratio: 3.2 ratio
Triglycerides: 134 mg/dL (ref <150)
VLDL: 27 mg/dL (ref 0–40)

## 2024-06-30 LAB — AMYLASE: Amylase: 88 U/L (ref 28–100)

## 2024-06-30 LAB — BRAIN NATRIURETIC PEPTIDE: B Natriuretic Peptide: 46.5 pg/mL (ref 0.0–100.0)

## 2024-06-30 NOTE — Patient Instructions (Signed)
 There has been no changes to your medications.  Labs done today, your results will be available in MyChart, we will contact you for abnormal readings.  Your physician has requested that you have an echocardiogram. Echocardiography is a painless test that uses sound waves to create images of your heart. It provides your doctor with information about the size and shape of your heart and how well your hearts chambers and valves are working. This procedure takes approximately one hour. There are no restrictions for this procedure. Please do NOT wear cologne, perfume, aftershave, or lotions (deodorant is allowed). Please arrive 15 minutes prior to your appointment time.  Please note: We ask at that you not bring children with you during ultrasound (echo/ vascular) testing. Due to room size and safety concerns, children are not allowed in the ultrasound rooms during exams. Our front office staff cannot provide observation of children in our lobby area while testing is being conducted. An adult accompanying a patient to their appointment will only be allowed in the ultrasound room at the discretion of the ultrasound technician under special circumstances. We apologize for any inconvenience.  You have been referred to the [facility]. They will call you to arrange your appointment.  Your physician recommends that you schedule a follow-up appointment in: 3 months.  If you have any questions or concerns before your next appointment please send us  a message through [place] or call our office at [phone].    TO LEAVE A MESSAGE FOR THE NURSE SELECT OPTION 2, PLEASE LEAVE A MESSAGE INCLUDING: YOUR NAME DATE OF BIRTH CALL BACK NUMBER REASON FOR CALL**this is important as we prioritize the call backs  YOU WILL RECEIVE A CALL BACK THE SAME DAY AS LONG AS YOU CALL BEFORE 4:00 PM  At the [facility], you and your health needs are our priority. As part of our continuing mission to  provide you with exceptional heart care, we have created designated Provider Care Teams. These Care Teams include your primary Cardiologist (physician) and Advanced Practice Providers (APPs- Physician Assistants and Nurse Practitioners) who all work together to provide you with the care you need, when you need it.   You may see any of the following providers on your designated Care Team at your next follow up: Dr Toribio Fuel Dr Ezra Shuck Dr. Morene Brownie Greig Mosses, NP Caffie Shed, [place] [facility] [place], [place] Beckey Coe, NP Jordan Lee, NP Ellouise Class, NP Tinnie Redman, PharmD Jaun Bash, PharmD   Please be sure to bring in all your medications bottles to every appointment.    Thank you for choosing [facility]

## 2024-06-30 NOTE — Progress Notes (Signed)
 Steven Burnett

## 2024-07-01 NOTE — Progress Notes (Incomplete)
 PCP: Dr Orpha Cardiology: Dr Rolan   HPI: Steven Burnett is a 63 y.o. obese male, former smoker, with h/o CAD w/ remote MI and coronary stenting (done at outside facility, details unknown), OSA w/ reported compliance w/ CPAP, h/o provoked DVT following knee surgery, T2DM, HTN, HLD, PAF, and chronic systolic heart failure.   Admitted 05/13/20 for acute hypoxic respiratory failure due to acute CHF>>cardiogenic shock, atrial flutter w/ RVR and ? PNA. Prior to admit he was drinking 48 ounces of alcohol 3-4 days a week. Echo in 10/21 showed EF < 20%, moderately decreased RV systolic function. Briefly on milrinone  but weaned off.  Placed on amiodarone  drip and chemically converted to NSR. Started GDMT.  Had cath that showed good cardiac output and distal coronary disease, no targets for intervention. Discharge weight 269 pounds.   Echo 2/22 showed EF 40% with normal RV and normal IVC.  He had atrial flutter ablation in 2/22.    Echo in 1/23 showed EF 50% with global hypokinesis, normal RV.   Echo 7/23 EF 45-50%, GHK of LV, GIDD, normal RV.   Today he returns for AHF follow up. Overall feeling good. Denies palpitations, CP, edema, or PND/Orthopnea. No SOB. Appetite good. Dizziness sometimes when he bends over and stands up quickly. No fever or chills. Weight at home 230 pounds. Taking all medications. No ETOH, does vape. Works full time in a programmer, applications business. Using CPAP. Uses Torsemide  PRN, last took yesterday. SBP at home 80-90s.   Echo today: read pending  ECG (personally reviewed): NSR 60s   Labs (12/21): LDL 67, K 4, creatinine 1.48 Labs (4/22): K 4.2, creatinine 1.5 Labs (5/22): K 3.9, creatinine 1.19 Labs (9/22): K 4.1, creatinine 1.49, LDL 44 Labs (1/23): K 4.5, creatinine 1.36, LDL 58 Labs (9/23): K 4.3, SCr 1.34. Labs (6/24): K 4.8, SCr 1.46  PMH: 1. OSA: Uses CPAP.  2. H/o DVT: Provoked, after knee surgery.  3. Type 2 diabetes. 4. CKD: Stage 3, possible diabetic nephropathy.   5. Atrial flutter: noted in 10/21, converted to NSR on amiodarone .  - Atrial flutter ablation in 2/22.  6. CAD: PCI in the remote past.   - LHC (10/21): 80% far distal LAD, patent OM1 stent, 60% dRCA, 50% proximal PLV, 80% mid to distal PDA. No interventional target.  7. 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  - Echo (10/21): EF <20%, moderately decreased RV systolic function.  - RHC (10/21): mean RA 8, PA 40/23, mean PCWP 21. CI 2.4.  - Echo (2/22): EF 40%, normal RV, normal IVC.  - Echo (1/23): EF 50% with global hypokinesis, normal RV.  - Echo 7/23 EF 45-50%, GHK of LV, GIDD, normal RV.   ROS: All systems negative except as listed in HPI, PMH and Problem List.  SH:  Social History   Socioeconomic History   Marital status: Married    Spouse name: Not on file   Number of children: Not on file   Years of education: Not on file   Highest education level: Not on file  Occupational History   Not on file  Tobacco Use   Smoking status: Former   Smokeless tobacco: Never   Tobacco comments:    Quit in 2014  Substance and Sexual Activity   Alcohol use: Not Currently    Comment: Former, quit in 2014   Drug use: Never   Sexual activity: Not on file  Other Topics Concern   Not  on file  Social History Narrative   Not on file   Social Drivers of Health   Tobacco Use: Medium Risk (06/30/2024)   Patient History    Smoking Tobacco Use: Former    Smokeless Tobacco Use: Never    Passive Exposure: Not on Actuary Strain: Not on file  Food Insecurity: Not on file  Transportation Needs: Not on file  Physical Activity: Not on file  Stress: Not on file  Social Connections: Not on file  Intimate Partner Violence: Not on file  Depression (PHQ2-9): Not on file  Alcohol Screen: Not on file  Housing: Not on file  Utilities: Not on file  Health Literacy: Not on  file    FH:  Family History  Problem Relation Age of Onset   Kidney failure Son        s/p transplant   Stroke Mother      Current Outpatient Medications  Medication Sig Dispense Refill   acetaminophen  (TYLENOL ) 325 MG tablet Take 325-650 mg by mouth every 6 (six) hours as needed for mild pain or headache.     allopurinol (ZYLOPRIM) 300 MG tablet Take 300 mg by mouth daily.     ALPRAZolam  (XANAX ) 1 MG tablet Take 1 mg by mouth 3 (three) times daily.      atorvastatin  (LIPITOR ) 80 MG tablet TAKE 1 TABLET BY MOUTH EVERY DAY 30 tablet 5   carvedilol  (COREG ) 12.5 MG tablet TAKE 1 TABLET BY MOUTH 2 TIMES DAILY. 44 tablet 28   CREON  24000-76000 units CPEP Take 1 capsule by mouth 3 (three) times daily before meals.      docusate sodium (COLACE) 100 MG capsule Take 100 mg by mouth in the morning, at noon, and at bedtime. As needed     ELIQUIS  5 MG TABS tablet TAKE 1 TABLET BY MOUTH TWICE A DAY 60 tablet 5   empagliflozin  (JARDIANCE ) 10 MG TABS tablet Take 1 tablet (10 mg total) by mouth daily before breakfast. 90 tablet 3   fenofibrate  (TRICOR ) 145 MG tablet Take 145 mg by mouth daily.      Omega 3 1000 MG CAPS Take 3,000 mg by mouth 3 (three) times daily.     pantoprazole  (PROTONIX ) 40 MG tablet Take 40 mg by mouth daily.     sacubitril -valsartan  (ENTRESTO ) 24-26 MG Take 1 tablet by mouth 2 (two) times daily. 180 tablet 1   sertraline  (ZOLOFT ) 100 MG tablet Take 50 mg by mouth at bedtime.     spironolactone  (ALDACTONE ) 25 MG tablet Take 1 tablet (25 mg total) by mouth daily. 90 tablet 1   tadalafil (CIALIS) 5 MG tablet Take 5 mg by mouth daily as needed for erectile dysfunction.     torsemide  (DEMADEX ) 20 MG tablet Take 10 mg by mouth as needed. (Patient taking differently: Take 10 mg by mouth every other day.)     Omega-3 Fatty Acids (FISH OIL) 1000 MG CAPS Take 300 mg by mouth 3 (three) times daily.     No current facility-administered medications for this encounter.     Vitals:   06/30/24 1125  BP: 118/80  Pulse: 63  SpO2: 95%  Weight: 118.8 kg (261 lb 12.8 oz)  Height: 5' 8 (1.727 m)    Wt Readings from Last 3 Encounters:  06/30/24 118.8 kg (261 lb 12.8 oz)  02/05/23 106 kg (233 lb 9.6 oz)  12/10/21 118.4 kg (261 lb)   PHYSICAL EXAM: General:  well appearing.  No respiratory difficulty. Walked  into clinic HEENT: normal Neck: supple. JVD ~6 cm. Carotids 2+ bilat; no bruits. No lymphadenopathy or thyromegaly appreciated. Cor: PMI nondisplaced. Regular rate & rhythm. No rubs, gallops or murmurs. Lungs: clear Abdomen: soft, nontender, nondistended. No hepatosplenomegaly. No bruits or masses. Good bowel sounds. Extremities: no cyanosis, clubbing, rash, edema  Neuro: alert & oriented x 3, cranial nerves grossly intact. moves all 4 extremities w/o difficulty. Affect pleasant.   ASSESSMENT & PLAN: 1. Chronic systolic CHF: Echo 04/2020 EF < 20%, moderate RV dysfunction.  He was in atrial flutter with RVR at admission, not sure how long this was present prior.  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 Coronary angiography showed primarily distal vessel CAD, no intervention.  Echo in 2/22 showed EF up to about 40%.  Echo in 1/23 showed EF up to 50%, global hypokinesis. Echo 7/23 EF 45-50%, GHK of LV, GIDD, normal RV. NYHA class I-II symptoms.  He is not volume overloaded on exam.   - echo today, read pending - Continue torsemide  as needed.  - Continue Coreg  12.5 mg bid.   - Decrease Entresto  49/51>24/26 bid with dizziness.  Suspect 2/2 weight loss and not enough fluid intake.  - Continue spironolactone  25 mg daily.   - Continue Jardiance  10 mg daily.     - BMET/BNP today.  2. Atrial flutter:  Admitted in atrial flutter with RVR on 10/21 admit. 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 Chemically converted to NSR with IV amiodarone .  Atrial flutter ablation in 2/22.  Now off amiodarone .      - Continue Eliquis  5 mg twice  a day. CBC today.  3. CAD: Remote history of CAD/PCI apparently in [place].  No chest pain.  HS-TnI mildly elevated, suspect demand ischemia with CHF/cardiogenic shock.  Coronary angiography in 10/21 with distal coronary disease, no target for intervention.  - Continue statin, LDL 42 6/24.  - No ASA given apixaban  use.  4. H/o DVT: Prior, provoked by surgery.  On Eliquis .  5. CKD Stage III: Creatinine 1.46 on last check, BMET today.  6. OSA: Continue CPAP every night.    7. Obesity: Body mass index is 39.81 kg/m.  - Weight coming down with semaglutide .   Follow up in 3 months with Dr. Rolan.   Ezra Rolan AGACNP-BC  07/01/2024

## 2024-07-11 ENCOUNTER — Telehealth: Payer: Self-pay | Admitting: Pharmacist

## 2024-07-11 NOTE — Telephone Encounter (Signed)
 Patient called and left a voicemail on my machine stating that he saw Dr. Rolan recently and he wanted to discuss with me resuming GLP-1 therapy.  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  Pancreatitis fairly significant and is now on pancrelipase  replacement with Creon .  He had been on Ozempic  for a couple years.  PCP checked amylase and lipase levels which were elevated.  We stopped his Ozempic .  Most recently had his amylase checked which was normal.  Patient requesting to go back on GLP-1 therapy. There certainly is a decent amount of risk of causing pancreatitis by resuming GLP-1 therapy.  Patient  has gained at least 30 pounds back since coming off of Ozempic .

## 2024-07-12 ENCOUNTER — Ambulatory Visit (HOSPITAL_COMMUNITY): Admission: RE | Admit: 2024-07-12 | Source: Ambulatory Visit

## 2024-07-13 ENCOUNTER — Ambulatory Visit (HOSPITAL_COMMUNITY)
Admission: RE | Admit: 2024-07-13 | Discharge: 2024-07-13 | Disposition: A | Discharge status: Home / Self Care | Source: Ambulatory Visit | Attending: Cardiology | Admitting: Cardiology

## 2024-07-13 DIAGNOSIS — I4892 Unspecified atrial flutter: Secondary | ICD-10-CM | POA: Insufficient documentation

## 2024-07-13 DIAGNOSIS — E785 Hyperlipidemia, unspecified: Secondary | ICD-10-CM | POA: Insufficient documentation

## 2024-07-13 DIAGNOSIS — Z87891 Personal history of nicotine dependence: Secondary | ICD-10-CM | POA: Insufficient documentation

## 2024-07-13 DIAGNOSIS — E119 Type 2 diabetes mellitus without complications: Secondary | ICD-10-CM | POA: Insufficient documentation

## 2024-07-13 DIAGNOSIS — I2510 Atherosclerotic heart disease of native coronary artery without angina pectoris: Secondary | ICD-10-CM | POA: Insufficient documentation

## 2024-07-13 DIAGNOSIS — I5022 Chronic systolic (congestive) heart failure: Secondary | ICD-10-CM | POA: Insufficient documentation

## 2024-07-13 DIAGNOSIS — I110 Hypertensive heart disease with heart failure: Secondary | ICD-10-CM | POA: Diagnosis not present

## 2024-07-13 MED ORDER — PERFLUTREN LIPID MICROSPHERE
1.0000 mL | INTRAVENOUS | Status: AC | PRN
  Administered 2024-07-13: 3 mL via INTRAVENOUS

## 2024-07-15 NOTE — Telephone Encounter (Signed)
 I spoke with patient. At the time (last Jan) when he told me his amylase and lipase were high, I did not know he had been drinking alcohol. He has not been drinking when I placed him on Wegovy  in Jan 2023, but I was aware he had alcohol induced pancreatitis but at the time of our visit was not drinking. I discussed with patient the concerns about putting him back on a GLP-1, risk of pancreatitis, especially if he continue to drink alcohol. He reports his last drink was Thanksgiving. I encouraged him to find an AA group to join for support. I encouraged him to find ways to be more active. I asked him to work on these things leading up to his appointment with me in Feb and we will discuss more in person.

## 2024-07-16 LAB — ECHOCARDIOGRAM COMPLETE
Area-P 1/2: 3.11 cm2
S' Lateral: 3.2 cm

## 2024-07-18 ENCOUNTER — Telehealth: Payer: Self-pay | Admitting: Pharmacy Technician

## 2024-07-18 ENCOUNTER — Other Ambulatory Visit (HOSPITAL_COMMUNITY): Payer: Self-pay

## 2024-07-18 NOTE — Telephone Encounter (Signed)
" ° ° ° °  Generic is 103.55 for 30 days. Brand is non-formulary. Patient has been getting generic since 03/05/24. Cancelled brand pa request  "

## 2024-07-21 DEATH — deceased

## 2024-08-29 ENCOUNTER — Ambulatory Visit: Admitting: Pharmacist

## 2024-09-28 ENCOUNTER — Ambulatory Visit (HOSPITAL_COMMUNITY)
